# Patient Record
Sex: Male | Born: 1935 | Race: White | Hispanic: No | Smoking: Former smoker
Health system: Southern US, Community
[De-identification: ages and names within clinical notes are randomized; demographics above are authoritative.]

## PROBLEM LIST (undated history)

## (undated) ENCOUNTER — Emergency Department (HOSPITAL_COMMUNITY): Admission: EM | Payer: Self-pay | Source: Home / Self Care

## (undated) DIAGNOSIS — I509 Heart failure, unspecified: Secondary | ICD-10-CM

## (undated) DIAGNOSIS — I059 Rheumatic mitral valve disease, unspecified: Secondary | ICD-10-CM

## (undated) DIAGNOSIS — H353120 Nonexudative age-related macular degeneration, left eye, stage unspecified: Secondary | ICD-10-CM

## (undated) DIAGNOSIS — Z8719 Personal history of other diseases of the digestive system: Secondary | ICD-10-CM

## (undated) DIAGNOSIS — H353210 Exudative age-related macular degeneration, right eye, stage unspecified: Secondary | ICD-10-CM

## (undated) DIAGNOSIS — I444 Left anterior fascicular block: Secondary | ICD-10-CM

## (undated) DIAGNOSIS — N200 Calculus of kidney: Secondary | ICD-10-CM

## (undated) DIAGNOSIS — J189 Pneumonia, unspecified organism: Secondary | ICD-10-CM

## (undated) DIAGNOSIS — I471 Supraventricular tachycardia: Secondary | ICD-10-CM

## (undated) DIAGNOSIS — M1990 Unspecified osteoarthritis, unspecified site: Secondary | ICD-10-CM

## (undated) DIAGNOSIS — R011 Cardiac murmur, unspecified: Secondary | ICD-10-CM

## (undated) DIAGNOSIS — K219 Gastro-esophageal reflux disease without esophagitis: Secondary | ICD-10-CM

## (undated) HISTORY — PX: SKIN BIOPSY: SHX1

## (undated) HISTORY — DX: Rheumatic mitral valve disease, unspecified: I05.9

## (undated) HISTORY — DX: Supraventricular tachycardia: I47.1

## (undated) HISTORY — DX: Left anterior fascicular block: I44.4

## (undated) HISTORY — PX: CARDIAC CATHETERIZATION: SHX172

## (undated) HISTORY — DX: Calculus of kidney: N20.0

---

## 2000-10-19 ENCOUNTER — Ambulatory Visit (HOSPITAL_COMMUNITY): Admission: RE | Admit: 2000-10-19 | Discharge: 2000-10-19 | Payer: Self-pay | Admitting: Cardiology

## 2007-02-07 DIAGNOSIS — E785 Hyperlipidemia, unspecified: Secondary | ICD-10-CM | POA: Insufficient documentation

## 2010-04-29 DIAGNOSIS — D179 Benign lipomatous neoplasm, unspecified: Secondary | ICD-10-CM | POA: Insufficient documentation

## 2010-07-19 DIAGNOSIS — M47812 Spondylosis without myelopathy or radiculopathy, cervical region: Secondary | ICD-10-CM | POA: Insufficient documentation

## 2010-07-19 DIAGNOSIS — M5412 Radiculopathy, cervical region: Secondary | ICD-10-CM | POA: Insufficient documentation

## 2010-08-13 DIAGNOSIS — N200 Calculus of kidney: Secondary | ICD-10-CM | POA: Insufficient documentation

## 2011-04-12 DIAGNOSIS — N200 Calculus of kidney: Secondary | ICD-10-CM

## 2011-04-12 HISTORY — PX: CATARACT EXTRACTION W/ INTRAOCULAR LENS  IMPLANT, BILATERAL: SHX1307

## 2011-04-12 HISTORY — DX: Calculus of kidney: N20.0

## 2011-05-10 DIAGNOSIS — M25511 Pain in right shoulder: Secondary | ICD-10-CM | POA: Insufficient documentation

## 2011-05-10 DIAGNOSIS — H269 Unspecified cataract: Secondary | ICD-10-CM | POA: Insufficient documentation

## 2011-05-10 DIAGNOSIS — H3530 Unspecified macular degeneration: Secondary | ICD-10-CM | POA: Insufficient documentation

## 2012-01-25 DIAGNOSIS — Z961 Presence of intraocular lens: Secondary | ICD-10-CM | POA: Insufficient documentation

## 2014-09-10 DIAGNOSIS — R278 Other lack of coordination: Secondary | ICD-10-CM | POA: Diagnosis not present

## 2014-09-10 DIAGNOSIS — M6281 Muscle weakness (generalized): Secondary | ICD-10-CM | POA: Diagnosis not present

## 2014-09-10 DIAGNOSIS — R269 Unspecified abnormalities of gait and mobility: Secondary | ICD-10-CM | POA: Diagnosis not present

## 2014-09-10 DIAGNOSIS — M170 Bilateral primary osteoarthritis of knee: Secondary | ICD-10-CM | POA: Diagnosis not present

## 2014-09-11 DIAGNOSIS — R278 Other lack of coordination: Secondary | ICD-10-CM | POA: Diagnosis not present

## 2014-09-11 DIAGNOSIS — M170 Bilateral primary osteoarthritis of knee: Secondary | ICD-10-CM | POA: Diagnosis not present

## 2014-09-11 DIAGNOSIS — M6281 Muscle weakness (generalized): Secondary | ICD-10-CM | POA: Diagnosis not present

## 2014-09-11 DIAGNOSIS — R269 Unspecified abnormalities of gait and mobility: Secondary | ICD-10-CM | POA: Diagnosis not present

## 2014-09-15 DIAGNOSIS — M170 Bilateral primary osteoarthritis of knee: Secondary | ICD-10-CM | POA: Diagnosis not present

## 2014-09-15 DIAGNOSIS — R278 Other lack of coordination: Secondary | ICD-10-CM | POA: Diagnosis not present

## 2014-09-15 DIAGNOSIS — M6281 Muscle weakness (generalized): Secondary | ICD-10-CM | POA: Diagnosis not present

## 2014-09-15 DIAGNOSIS — R269 Unspecified abnormalities of gait and mobility: Secondary | ICD-10-CM | POA: Diagnosis not present

## 2014-09-16 DIAGNOSIS — M170 Bilateral primary osteoarthritis of knee: Secondary | ICD-10-CM | POA: Diagnosis not present

## 2014-09-16 DIAGNOSIS — R278 Other lack of coordination: Secondary | ICD-10-CM | POA: Diagnosis not present

## 2014-09-16 DIAGNOSIS — R269 Unspecified abnormalities of gait and mobility: Secondary | ICD-10-CM | POA: Diagnosis not present

## 2014-09-16 DIAGNOSIS — M6281 Muscle weakness (generalized): Secondary | ICD-10-CM | POA: Diagnosis not present

## 2014-09-17 DIAGNOSIS — R278 Other lack of coordination: Secondary | ICD-10-CM | POA: Diagnosis not present

## 2014-09-17 DIAGNOSIS — M6281 Muscle weakness (generalized): Secondary | ICD-10-CM | POA: Diagnosis not present

## 2014-09-17 DIAGNOSIS — R269 Unspecified abnormalities of gait and mobility: Secondary | ICD-10-CM | POA: Diagnosis not present

## 2014-09-17 DIAGNOSIS — M170 Bilateral primary osteoarthritis of knee: Secondary | ICD-10-CM | POA: Diagnosis not present

## 2014-09-18 DIAGNOSIS — R269 Unspecified abnormalities of gait and mobility: Secondary | ICD-10-CM | POA: Diagnosis not present

## 2014-09-18 DIAGNOSIS — M6281 Muscle weakness (generalized): Secondary | ICD-10-CM | POA: Diagnosis not present

## 2014-09-18 DIAGNOSIS — R278 Other lack of coordination: Secondary | ICD-10-CM | POA: Diagnosis not present

## 2014-09-18 DIAGNOSIS — M170 Bilateral primary osteoarthritis of knee: Secondary | ICD-10-CM | POA: Diagnosis not present

## 2014-09-22 DIAGNOSIS — M6281 Muscle weakness (generalized): Secondary | ICD-10-CM | POA: Diagnosis not present

## 2014-09-22 DIAGNOSIS — R269 Unspecified abnormalities of gait and mobility: Secondary | ICD-10-CM | POA: Diagnosis not present

## 2014-09-22 DIAGNOSIS — M170 Bilateral primary osteoarthritis of knee: Secondary | ICD-10-CM | POA: Diagnosis not present

## 2014-09-22 DIAGNOSIS — R278 Other lack of coordination: Secondary | ICD-10-CM | POA: Diagnosis not present

## 2014-09-23 DIAGNOSIS — M170 Bilateral primary osteoarthritis of knee: Secondary | ICD-10-CM | POA: Diagnosis not present

## 2014-09-23 DIAGNOSIS — Z6828 Body mass index (BMI) 28.0-28.9, adult: Secondary | ICD-10-CM | POA: Diagnosis not present

## 2014-09-23 DIAGNOSIS — M6281 Muscle weakness (generalized): Secondary | ICD-10-CM | POA: Diagnosis not present

## 2014-09-23 DIAGNOSIS — N400 Enlarged prostate without lower urinary tract symptoms: Secondary | ICD-10-CM | POA: Diagnosis not present

## 2014-09-23 DIAGNOSIS — R269 Unspecified abnormalities of gait and mobility: Secondary | ICD-10-CM | POA: Diagnosis not present

## 2014-09-23 DIAGNOSIS — H3530 Unspecified macular degeneration: Secondary | ICD-10-CM | POA: Diagnosis not present

## 2014-09-23 DIAGNOSIS — Z0000 Encounter for general adult medical examination without abnormal findings: Secondary | ICD-10-CM | POA: Diagnosis not present

## 2014-09-23 DIAGNOSIS — M1990 Unspecified osteoarthritis, unspecified site: Secondary | ICD-10-CM | POA: Diagnosis not present

## 2014-09-23 DIAGNOSIS — R278 Other lack of coordination: Secondary | ICD-10-CM | POA: Diagnosis not present

## 2014-09-23 DIAGNOSIS — K219 Gastro-esophageal reflux disease without esophagitis: Secondary | ICD-10-CM | POA: Diagnosis not present

## 2014-09-23 DIAGNOSIS — I38 Endocarditis, valve unspecified: Secondary | ICD-10-CM | POA: Diagnosis not present

## 2014-09-23 DIAGNOSIS — Z23 Encounter for immunization: Secondary | ICD-10-CM | POA: Diagnosis not present

## 2014-09-24 DIAGNOSIS — Z1212 Encounter for screening for malignant neoplasm of rectum: Secondary | ICD-10-CM | POA: Diagnosis not present

## 2014-09-24 DIAGNOSIS — M6281 Muscle weakness (generalized): Secondary | ICD-10-CM | POA: Diagnosis not present

## 2014-09-24 DIAGNOSIS — R269 Unspecified abnormalities of gait and mobility: Secondary | ICD-10-CM | POA: Diagnosis not present

## 2014-09-24 DIAGNOSIS — M170 Bilateral primary osteoarthritis of knee: Secondary | ICD-10-CM | POA: Diagnosis not present

## 2014-09-24 DIAGNOSIS — R278 Other lack of coordination: Secondary | ICD-10-CM | POA: Diagnosis not present

## 2014-09-29 DIAGNOSIS — M170 Bilateral primary osteoarthritis of knee: Secondary | ICD-10-CM | POA: Diagnosis not present

## 2014-09-29 DIAGNOSIS — R269 Unspecified abnormalities of gait and mobility: Secondary | ICD-10-CM | POA: Diagnosis not present

## 2014-09-29 DIAGNOSIS — M6281 Muscle weakness (generalized): Secondary | ICD-10-CM | POA: Diagnosis not present

## 2014-09-29 DIAGNOSIS — R278 Other lack of coordination: Secondary | ICD-10-CM | POA: Diagnosis not present

## 2014-10-01 DIAGNOSIS — R278 Other lack of coordination: Secondary | ICD-10-CM | POA: Diagnosis not present

## 2014-10-01 DIAGNOSIS — M6281 Muscle weakness (generalized): Secondary | ICD-10-CM | POA: Diagnosis not present

## 2014-10-01 DIAGNOSIS — R269 Unspecified abnormalities of gait and mobility: Secondary | ICD-10-CM | POA: Diagnosis not present

## 2014-10-01 DIAGNOSIS — M170 Bilateral primary osteoarthritis of knee: Secondary | ICD-10-CM | POA: Diagnosis not present

## 2014-10-06 DIAGNOSIS — M6281 Muscle weakness (generalized): Secondary | ICD-10-CM | POA: Diagnosis not present

## 2014-10-06 DIAGNOSIS — R269 Unspecified abnormalities of gait and mobility: Secondary | ICD-10-CM | POA: Diagnosis not present

## 2014-10-06 DIAGNOSIS — R278 Other lack of coordination: Secondary | ICD-10-CM | POA: Diagnosis not present

## 2014-10-06 DIAGNOSIS — M170 Bilateral primary osteoarthritis of knee: Secondary | ICD-10-CM | POA: Diagnosis not present

## 2014-10-08 DIAGNOSIS — M170 Bilateral primary osteoarthritis of knee: Secondary | ICD-10-CM | POA: Diagnosis not present

## 2014-10-08 DIAGNOSIS — M6281 Muscle weakness (generalized): Secondary | ICD-10-CM | POA: Diagnosis not present

## 2014-10-08 DIAGNOSIS — R269 Unspecified abnormalities of gait and mobility: Secondary | ICD-10-CM | POA: Diagnosis not present

## 2014-10-08 DIAGNOSIS — R278 Other lack of coordination: Secondary | ICD-10-CM | POA: Diagnosis not present

## 2014-10-15 DIAGNOSIS — M79622 Pain in left upper arm: Secondary | ICD-10-CM | POA: Diagnosis not present

## 2014-10-15 DIAGNOSIS — M6281 Muscle weakness (generalized): Secondary | ICD-10-CM | POA: Diagnosis not present

## 2014-10-15 DIAGNOSIS — R278 Other lack of coordination: Secondary | ICD-10-CM | POA: Diagnosis not present

## 2014-12-26 DIAGNOSIS — M25561 Pain in right knee: Secondary | ICD-10-CM | POA: Diagnosis not present

## 2014-12-26 DIAGNOSIS — M1711 Unilateral primary osteoarthritis, right knee: Secondary | ICD-10-CM | POA: Diagnosis not present

## 2014-12-29 DIAGNOSIS — Z23 Encounter for immunization: Secondary | ICD-10-CM | POA: Diagnosis not present

## 2015-01-01 DIAGNOSIS — H6123 Impacted cerumen, bilateral: Secondary | ICD-10-CM | POA: Diagnosis not present

## 2015-01-01 DIAGNOSIS — H9190 Unspecified hearing loss, unspecified ear: Secondary | ICD-10-CM | POA: Diagnosis not present

## 2015-01-01 DIAGNOSIS — Z6828 Body mass index (BMI) 28.0-28.9, adult: Secondary | ICD-10-CM | POA: Diagnosis not present

## 2015-01-02 DIAGNOSIS — M1711 Unilateral primary osteoarthritis, right knee: Secondary | ICD-10-CM | POA: Diagnosis not present

## 2015-01-09 DIAGNOSIS — M1711 Unilateral primary osteoarthritis, right knee: Secondary | ICD-10-CM | POA: Diagnosis not present

## 2015-02-02 DIAGNOSIS — M25561 Pain in right knee: Secondary | ICD-10-CM | POA: Diagnosis not present

## 2015-02-02 DIAGNOSIS — R278 Other lack of coordination: Secondary | ICD-10-CM | POA: Diagnosis not present

## 2015-02-02 DIAGNOSIS — M6281 Muscle weakness (generalized): Secondary | ICD-10-CM | POA: Diagnosis not present

## 2015-02-02 DIAGNOSIS — R262 Difficulty in walking, not elsewhere classified: Secondary | ICD-10-CM | POA: Diagnosis not present

## 2015-02-02 DIAGNOSIS — M25562 Pain in left knee: Secondary | ICD-10-CM | POA: Diagnosis not present

## 2015-02-04 DIAGNOSIS — M25562 Pain in left knee: Secondary | ICD-10-CM | POA: Diagnosis not present

## 2015-02-04 DIAGNOSIS — M6281 Muscle weakness (generalized): Secondary | ICD-10-CM | POA: Diagnosis not present

## 2015-02-04 DIAGNOSIS — R262 Difficulty in walking, not elsewhere classified: Secondary | ICD-10-CM | POA: Diagnosis not present

## 2015-02-04 DIAGNOSIS — R278 Other lack of coordination: Secondary | ICD-10-CM | POA: Diagnosis not present

## 2015-02-04 DIAGNOSIS — M25561 Pain in right knee: Secondary | ICD-10-CM | POA: Diagnosis not present

## 2015-02-05 DIAGNOSIS — R262 Difficulty in walking, not elsewhere classified: Secondary | ICD-10-CM | POA: Diagnosis not present

## 2015-02-05 DIAGNOSIS — M25561 Pain in right knee: Secondary | ICD-10-CM | POA: Diagnosis not present

## 2015-02-05 DIAGNOSIS — M6281 Muscle weakness (generalized): Secondary | ICD-10-CM | POA: Diagnosis not present

## 2015-02-05 DIAGNOSIS — M25562 Pain in left knee: Secondary | ICD-10-CM | POA: Diagnosis not present

## 2015-02-05 DIAGNOSIS — R278 Other lack of coordination: Secondary | ICD-10-CM | POA: Diagnosis not present

## 2015-02-10 DIAGNOSIS — M25562 Pain in left knee: Secondary | ICD-10-CM | POA: Diagnosis not present

## 2015-02-10 DIAGNOSIS — R262 Difficulty in walking, not elsewhere classified: Secondary | ICD-10-CM | POA: Diagnosis not present

## 2015-02-10 DIAGNOSIS — M6281 Muscle weakness (generalized): Secondary | ICD-10-CM | POA: Diagnosis not present

## 2015-02-10 DIAGNOSIS — M25561 Pain in right knee: Secondary | ICD-10-CM | POA: Diagnosis not present

## 2015-02-10 DIAGNOSIS — R278 Other lack of coordination: Secondary | ICD-10-CM | POA: Diagnosis not present

## 2015-02-12 DIAGNOSIS — R278 Other lack of coordination: Secondary | ICD-10-CM | POA: Diagnosis not present

## 2015-02-12 DIAGNOSIS — R262 Difficulty in walking, not elsewhere classified: Secondary | ICD-10-CM | POA: Diagnosis not present

## 2015-02-12 DIAGNOSIS — M25561 Pain in right knee: Secondary | ICD-10-CM | POA: Diagnosis not present

## 2015-02-12 DIAGNOSIS — M25562 Pain in left knee: Secondary | ICD-10-CM | POA: Diagnosis not present

## 2015-02-12 DIAGNOSIS — M6281 Muscle weakness (generalized): Secondary | ICD-10-CM | POA: Diagnosis not present

## 2015-02-13 DIAGNOSIS — M25561 Pain in right knee: Secondary | ICD-10-CM | POA: Diagnosis not present

## 2015-02-13 DIAGNOSIS — R278 Other lack of coordination: Secondary | ICD-10-CM | POA: Diagnosis not present

## 2015-02-13 DIAGNOSIS — M25562 Pain in left knee: Secondary | ICD-10-CM | POA: Diagnosis not present

## 2015-02-13 DIAGNOSIS — R262 Difficulty in walking, not elsewhere classified: Secondary | ICD-10-CM | POA: Diagnosis not present

## 2015-02-13 DIAGNOSIS — M6281 Muscle weakness (generalized): Secondary | ICD-10-CM | POA: Diagnosis not present

## 2015-02-16 DIAGNOSIS — M25562 Pain in left knee: Secondary | ICD-10-CM | POA: Diagnosis not present

## 2015-02-16 DIAGNOSIS — M25561 Pain in right knee: Secondary | ICD-10-CM | POA: Diagnosis not present

## 2015-02-16 DIAGNOSIS — R278 Other lack of coordination: Secondary | ICD-10-CM | POA: Diagnosis not present

## 2015-02-16 DIAGNOSIS — M6281 Muscle weakness (generalized): Secondary | ICD-10-CM | POA: Diagnosis not present

## 2015-02-16 DIAGNOSIS — R262 Difficulty in walking, not elsewhere classified: Secondary | ICD-10-CM | POA: Diagnosis not present

## 2015-02-19 DIAGNOSIS — R278 Other lack of coordination: Secondary | ICD-10-CM | POA: Diagnosis not present

## 2015-02-19 DIAGNOSIS — M25562 Pain in left knee: Secondary | ICD-10-CM | POA: Diagnosis not present

## 2015-02-19 DIAGNOSIS — M25561 Pain in right knee: Secondary | ICD-10-CM | POA: Diagnosis not present

## 2015-02-19 DIAGNOSIS — R262 Difficulty in walking, not elsewhere classified: Secondary | ICD-10-CM | POA: Diagnosis not present

## 2015-02-19 DIAGNOSIS — M6281 Muscle weakness (generalized): Secondary | ICD-10-CM | POA: Diagnosis not present

## 2015-02-20 DIAGNOSIS — M25561 Pain in right knee: Secondary | ICD-10-CM | POA: Diagnosis not present

## 2015-02-20 DIAGNOSIS — M6281 Muscle weakness (generalized): Secondary | ICD-10-CM | POA: Diagnosis not present

## 2015-02-20 DIAGNOSIS — R278 Other lack of coordination: Secondary | ICD-10-CM | POA: Diagnosis not present

## 2015-02-20 DIAGNOSIS — R262 Difficulty in walking, not elsewhere classified: Secondary | ICD-10-CM | POA: Diagnosis not present

## 2015-02-20 DIAGNOSIS — M25562 Pain in left knee: Secondary | ICD-10-CM | POA: Diagnosis not present

## 2015-02-23 DIAGNOSIS — R262 Difficulty in walking, not elsewhere classified: Secondary | ICD-10-CM | POA: Diagnosis not present

## 2015-02-23 DIAGNOSIS — M6281 Muscle weakness (generalized): Secondary | ICD-10-CM | POA: Diagnosis not present

## 2015-02-23 DIAGNOSIS — R278 Other lack of coordination: Secondary | ICD-10-CM | POA: Diagnosis not present

## 2015-02-23 DIAGNOSIS — M25561 Pain in right knee: Secondary | ICD-10-CM | POA: Diagnosis not present

## 2015-02-23 DIAGNOSIS — M25562 Pain in left knee: Secondary | ICD-10-CM | POA: Diagnosis not present

## 2015-02-24 DIAGNOSIS — H906 Mixed conductive and sensorineural hearing loss, bilateral: Secondary | ICD-10-CM | POA: Diagnosis not present

## 2015-02-24 DIAGNOSIS — H903 Sensorineural hearing loss, bilateral: Secondary | ICD-10-CM | POA: Diagnosis not present

## 2015-02-25 DIAGNOSIS — M6281 Muscle weakness (generalized): Secondary | ICD-10-CM | POA: Diagnosis not present

## 2015-02-25 DIAGNOSIS — M25562 Pain in left knee: Secondary | ICD-10-CM | POA: Diagnosis not present

## 2015-02-25 DIAGNOSIS — M25561 Pain in right knee: Secondary | ICD-10-CM | POA: Diagnosis not present

## 2015-02-25 DIAGNOSIS — R278 Other lack of coordination: Secondary | ICD-10-CM | POA: Diagnosis not present

## 2015-02-25 DIAGNOSIS — R262 Difficulty in walking, not elsewhere classified: Secondary | ICD-10-CM | POA: Diagnosis not present

## 2015-03-02 DIAGNOSIS — R262 Difficulty in walking, not elsewhere classified: Secondary | ICD-10-CM | POA: Diagnosis not present

## 2015-03-02 DIAGNOSIS — R278 Other lack of coordination: Secondary | ICD-10-CM | POA: Diagnosis not present

## 2015-03-02 DIAGNOSIS — M25561 Pain in right knee: Secondary | ICD-10-CM | POA: Diagnosis not present

## 2015-03-02 DIAGNOSIS — M25562 Pain in left knee: Secondary | ICD-10-CM | POA: Diagnosis not present

## 2015-03-02 DIAGNOSIS — M6281 Muscle weakness (generalized): Secondary | ICD-10-CM | POA: Diagnosis not present

## 2015-03-04 DIAGNOSIS — M6281 Muscle weakness (generalized): Secondary | ICD-10-CM | POA: Diagnosis not present

## 2015-03-04 DIAGNOSIS — M25562 Pain in left knee: Secondary | ICD-10-CM | POA: Diagnosis not present

## 2015-03-04 DIAGNOSIS — R278 Other lack of coordination: Secondary | ICD-10-CM | POA: Diagnosis not present

## 2015-03-04 DIAGNOSIS — R262 Difficulty in walking, not elsewhere classified: Secondary | ICD-10-CM | POA: Diagnosis not present

## 2015-03-04 DIAGNOSIS — M25561 Pain in right knee: Secondary | ICD-10-CM | POA: Diagnosis not present

## 2015-03-09 DIAGNOSIS — M6281 Muscle weakness (generalized): Secondary | ICD-10-CM | POA: Diagnosis not present

## 2015-03-09 DIAGNOSIS — M25562 Pain in left knee: Secondary | ICD-10-CM | POA: Diagnosis not present

## 2015-03-09 DIAGNOSIS — M25561 Pain in right knee: Secondary | ICD-10-CM | POA: Diagnosis not present

## 2015-03-09 DIAGNOSIS — R278 Other lack of coordination: Secondary | ICD-10-CM | POA: Diagnosis not present

## 2015-03-09 DIAGNOSIS — R262 Difficulty in walking, not elsewhere classified: Secondary | ICD-10-CM | POA: Diagnosis not present

## 2015-03-12 DIAGNOSIS — R262 Difficulty in walking, not elsewhere classified: Secondary | ICD-10-CM | POA: Diagnosis not present

## 2015-03-12 DIAGNOSIS — M6281 Muscle weakness (generalized): Secondary | ICD-10-CM | POA: Diagnosis not present

## 2015-03-12 DIAGNOSIS — M25562 Pain in left knee: Secondary | ICD-10-CM | POA: Diagnosis not present

## 2015-03-12 DIAGNOSIS — M25561 Pain in right knee: Secondary | ICD-10-CM | POA: Diagnosis not present

## 2015-03-12 DIAGNOSIS — R278 Other lack of coordination: Secondary | ICD-10-CM | POA: Diagnosis not present

## 2015-03-16 DIAGNOSIS — M25562 Pain in left knee: Secondary | ICD-10-CM | POA: Diagnosis not present

## 2015-03-16 DIAGNOSIS — M6281 Muscle weakness (generalized): Secondary | ICD-10-CM | POA: Diagnosis not present

## 2015-03-16 DIAGNOSIS — M25561 Pain in right knee: Secondary | ICD-10-CM | POA: Diagnosis not present

## 2015-03-16 DIAGNOSIS — R278 Other lack of coordination: Secondary | ICD-10-CM | POA: Diagnosis not present

## 2015-03-16 DIAGNOSIS — R262 Difficulty in walking, not elsewhere classified: Secondary | ICD-10-CM | POA: Diagnosis not present

## 2015-04-07 DIAGNOSIS — H0100B Unspecified blepharitis left eye, upper and lower eyelids: Secondary | ICD-10-CM

## 2015-04-07 DIAGNOSIS — Z961 Presence of intraocular lens: Secondary | ICD-10-CM | POA: Diagnosis not present

## 2015-04-07 DIAGNOSIS — H0100A Unspecified blepharitis right eye, upper and lower eyelids: Secondary | ICD-10-CM | POA: Insufficient documentation

## 2015-04-07 DIAGNOSIS — H43812 Vitreous degeneration, left eye: Secondary | ICD-10-CM | POA: Diagnosis not present

## 2015-04-07 DIAGNOSIS — H353190 Nonexudative age-related macular degeneration, unspecified eye, stage unspecified: Secondary | ICD-10-CM | POA: Diagnosis not present

## 2015-04-07 DIAGNOSIS — H01004 Unspecified blepharitis left upper eyelid: Secondary | ICD-10-CM | POA: Diagnosis not present

## 2015-04-07 DIAGNOSIS — H01005 Unspecified blepharitis left lower eyelid: Secondary | ICD-10-CM | POA: Diagnosis not present

## 2015-04-07 DIAGNOSIS — H01001 Unspecified blepharitis right upper eyelid: Secondary | ICD-10-CM | POA: Diagnosis not present

## 2015-04-07 DIAGNOSIS — H01002 Unspecified blepharitis right lower eyelid: Secondary | ICD-10-CM | POA: Diagnosis not present

## 2015-05-27 DIAGNOSIS — H52203 Unspecified astigmatism, bilateral: Secondary | ICD-10-CM | POA: Diagnosis not present

## 2015-05-27 DIAGNOSIS — H5203 Hypermetropia, bilateral: Secondary | ICD-10-CM | POA: Diagnosis not present

## 2015-06-03 DIAGNOSIS — N200 Calculus of kidney: Secondary | ICD-10-CM | POA: Diagnosis not present

## 2015-06-03 DIAGNOSIS — N400 Enlarged prostate without lower urinary tract symptoms: Secondary | ICD-10-CM | POA: Diagnosis not present

## 2015-06-03 DIAGNOSIS — R3129 Other microscopic hematuria: Secondary | ICD-10-CM | POA: Diagnosis not present

## 2015-06-15 DIAGNOSIS — H353190 Nonexudative age-related macular degeneration, unspecified eye, stage unspecified: Secondary | ICD-10-CM | POA: Diagnosis not present

## 2015-06-15 DIAGNOSIS — N400 Enlarged prostate without lower urinary tract symptoms: Secondary | ICD-10-CM | POA: Diagnosis not present

## 2015-06-15 DIAGNOSIS — M1990 Unspecified osteoarthritis, unspecified site: Secondary | ICD-10-CM | POA: Diagnosis not present

## 2015-06-15 DIAGNOSIS — H918X3 Other specified hearing loss, bilateral: Secondary | ICD-10-CM | POA: Diagnosis not present

## 2015-06-15 DIAGNOSIS — Z6828 Body mass index (BMI) 28.0-28.9, adult: Secondary | ICD-10-CM | POA: Diagnosis not present

## 2015-09-18 DIAGNOSIS — I4460 Unspecified fascicular block: Secondary | ICD-10-CM | POA: Diagnosis not present

## 2015-09-18 DIAGNOSIS — I059 Rheumatic mitral valve disease, unspecified: Secondary | ICD-10-CM | POA: Diagnosis not present

## 2015-09-22 DIAGNOSIS — Z0000 Encounter for general adult medical examination without abnormal findings: Secondary | ICD-10-CM | POA: Diagnosis not present

## 2015-09-22 DIAGNOSIS — L0889 Other specified local infections of the skin and subcutaneous tissue: Secondary | ICD-10-CM | POA: Diagnosis not present

## 2015-09-22 DIAGNOSIS — M79642 Pain in left hand: Secondary | ICD-10-CM | POA: Diagnosis not present

## 2015-09-29 DIAGNOSIS — Z0000 Encounter for general adult medical examination without abnormal findings: Secondary | ICD-10-CM | POA: Diagnosis not present

## 2015-09-29 DIAGNOSIS — Z1389 Encounter for screening for other disorder: Secondary | ICD-10-CM | POA: Diagnosis not present

## 2015-09-29 DIAGNOSIS — H3530 Unspecified macular degeneration: Secondary | ICD-10-CM | POA: Diagnosis not present

## 2015-09-29 DIAGNOSIS — M25519 Pain in unspecified shoulder: Secondary | ICD-10-CM | POA: Diagnosis not present

## 2015-09-29 DIAGNOSIS — L0889 Other specified local infections of the skin and subcutaneous tissue: Secondary | ICD-10-CM | POA: Diagnosis not present

## 2015-09-29 DIAGNOSIS — K219 Gastro-esophageal reflux disease without esophagitis: Secondary | ICD-10-CM | POA: Diagnosis not present

## 2015-09-29 DIAGNOSIS — Z6828 Body mass index (BMI) 28.0-28.9, adult: Secondary | ICD-10-CM | POA: Diagnosis not present

## 2015-09-29 DIAGNOSIS — M25569 Pain in unspecified knee: Secondary | ICD-10-CM | POA: Diagnosis not present

## 2015-09-29 DIAGNOSIS — N400 Enlarged prostate without lower urinary tract symptoms: Secondary | ICD-10-CM | POA: Diagnosis not present

## 2015-10-06 DIAGNOSIS — H01004 Unspecified blepharitis left upper eyelid: Secondary | ICD-10-CM | POA: Diagnosis not present

## 2015-10-06 DIAGNOSIS — H01001 Unspecified blepharitis right upper eyelid: Secondary | ICD-10-CM | POA: Diagnosis not present

## 2015-10-06 DIAGNOSIS — H353130 Nonexudative age-related macular degeneration, bilateral, stage unspecified: Secondary | ICD-10-CM | POA: Diagnosis not present

## 2015-10-06 DIAGNOSIS — H01002 Unspecified blepharitis right lower eyelid: Secondary | ICD-10-CM | POA: Diagnosis not present

## 2015-10-06 DIAGNOSIS — Z961 Presence of intraocular lens: Secondary | ICD-10-CM | POA: Diagnosis not present

## 2015-10-06 DIAGNOSIS — H43812 Vitreous degeneration, left eye: Secondary | ICD-10-CM | POA: Diagnosis not present

## 2015-10-06 DIAGNOSIS — H01005 Unspecified blepharitis left lower eyelid: Secondary | ICD-10-CM | POA: Diagnosis not present

## 2015-11-03 DIAGNOSIS — R279 Unspecified lack of coordination: Secondary | ICD-10-CM | POA: Diagnosis not present

## 2015-11-03 DIAGNOSIS — R52 Pain, unspecified: Secondary | ICD-10-CM | POA: Diagnosis not present

## 2015-11-03 DIAGNOSIS — S4992XD Unspecified injury of left shoulder and upper arm, subsequent encounter: Secondary | ICD-10-CM | POA: Diagnosis not present

## 2015-11-03 DIAGNOSIS — M6281 Muscle weakness (generalized): Secondary | ICD-10-CM | POA: Diagnosis not present

## 2015-11-03 DIAGNOSIS — R262 Difficulty in walking, not elsewhere classified: Secondary | ICD-10-CM | POA: Diagnosis not present

## 2015-11-04 DIAGNOSIS — R262 Difficulty in walking, not elsewhere classified: Secondary | ICD-10-CM | POA: Diagnosis not present

## 2015-11-04 DIAGNOSIS — S4992XD Unspecified injury of left shoulder and upper arm, subsequent encounter: Secondary | ICD-10-CM | POA: Diagnosis not present

## 2015-11-04 DIAGNOSIS — R279 Unspecified lack of coordination: Secondary | ICD-10-CM | POA: Diagnosis not present

## 2015-11-04 DIAGNOSIS — R52 Pain, unspecified: Secondary | ICD-10-CM | POA: Diagnosis not present

## 2015-11-04 DIAGNOSIS — M6281 Muscle weakness (generalized): Secondary | ICD-10-CM | POA: Diagnosis not present

## 2015-11-10 DIAGNOSIS — S4992XD Unspecified injury of left shoulder and upper arm, subsequent encounter: Secondary | ICD-10-CM | POA: Diagnosis not present

## 2015-11-10 DIAGNOSIS — R262 Difficulty in walking, not elsewhere classified: Secondary | ICD-10-CM | POA: Diagnosis not present

## 2015-11-10 DIAGNOSIS — R279 Unspecified lack of coordination: Secondary | ICD-10-CM | POA: Diagnosis not present

## 2015-11-10 DIAGNOSIS — R52 Pain, unspecified: Secondary | ICD-10-CM | POA: Diagnosis not present

## 2015-11-10 DIAGNOSIS — M6281 Muscle weakness (generalized): Secondary | ICD-10-CM | POA: Diagnosis not present

## 2015-11-11 DIAGNOSIS — R52 Pain, unspecified: Secondary | ICD-10-CM | POA: Diagnosis not present

## 2015-11-11 DIAGNOSIS — S4992XD Unspecified injury of left shoulder and upper arm, subsequent encounter: Secondary | ICD-10-CM | POA: Diagnosis not present

## 2015-11-11 DIAGNOSIS — M6281 Muscle weakness (generalized): Secondary | ICD-10-CM | POA: Diagnosis not present

## 2015-11-11 DIAGNOSIS — R279 Unspecified lack of coordination: Secondary | ICD-10-CM | POA: Diagnosis not present

## 2015-11-11 DIAGNOSIS — R262 Difficulty in walking, not elsewhere classified: Secondary | ICD-10-CM | POA: Diagnosis not present

## 2015-11-12 DIAGNOSIS — R262 Difficulty in walking, not elsewhere classified: Secondary | ICD-10-CM | POA: Diagnosis not present

## 2015-11-12 DIAGNOSIS — S4992XD Unspecified injury of left shoulder and upper arm, subsequent encounter: Secondary | ICD-10-CM | POA: Diagnosis not present

## 2015-11-12 DIAGNOSIS — R279 Unspecified lack of coordination: Secondary | ICD-10-CM | POA: Diagnosis not present

## 2015-11-12 DIAGNOSIS — R52 Pain, unspecified: Secondary | ICD-10-CM | POA: Diagnosis not present

## 2015-11-12 DIAGNOSIS — M6281 Muscle weakness (generalized): Secondary | ICD-10-CM | POA: Diagnosis not present

## 2015-11-17 DIAGNOSIS — R279 Unspecified lack of coordination: Secondary | ICD-10-CM | POA: Diagnosis not present

## 2015-11-17 DIAGNOSIS — S4992XD Unspecified injury of left shoulder and upper arm, subsequent encounter: Secondary | ICD-10-CM | POA: Diagnosis not present

## 2015-11-17 DIAGNOSIS — R52 Pain, unspecified: Secondary | ICD-10-CM | POA: Diagnosis not present

## 2015-11-17 DIAGNOSIS — R262 Difficulty in walking, not elsewhere classified: Secondary | ICD-10-CM | POA: Diagnosis not present

## 2015-11-17 DIAGNOSIS — M6281 Muscle weakness (generalized): Secondary | ICD-10-CM | POA: Diagnosis not present

## 2015-11-19 DIAGNOSIS — R52 Pain, unspecified: Secondary | ICD-10-CM | POA: Diagnosis not present

## 2015-11-19 DIAGNOSIS — M6281 Muscle weakness (generalized): Secondary | ICD-10-CM | POA: Diagnosis not present

## 2015-11-19 DIAGNOSIS — R279 Unspecified lack of coordination: Secondary | ICD-10-CM | POA: Diagnosis not present

## 2015-11-19 DIAGNOSIS — S4992XD Unspecified injury of left shoulder and upper arm, subsequent encounter: Secondary | ICD-10-CM | POA: Diagnosis not present

## 2015-11-19 DIAGNOSIS — R262 Difficulty in walking, not elsewhere classified: Secondary | ICD-10-CM | POA: Diagnosis not present

## 2015-11-24 DIAGNOSIS — R52 Pain, unspecified: Secondary | ICD-10-CM | POA: Diagnosis not present

## 2015-11-24 DIAGNOSIS — R279 Unspecified lack of coordination: Secondary | ICD-10-CM | POA: Diagnosis not present

## 2015-11-24 DIAGNOSIS — S4992XD Unspecified injury of left shoulder and upper arm, subsequent encounter: Secondary | ICD-10-CM | POA: Diagnosis not present

## 2015-11-24 DIAGNOSIS — R262 Difficulty in walking, not elsewhere classified: Secondary | ICD-10-CM | POA: Diagnosis not present

## 2015-11-24 DIAGNOSIS — M6281 Muscle weakness (generalized): Secondary | ICD-10-CM | POA: Diagnosis not present

## 2015-11-25 DIAGNOSIS — R52 Pain, unspecified: Secondary | ICD-10-CM | POA: Diagnosis not present

## 2015-11-25 DIAGNOSIS — M6281 Muscle weakness (generalized): Secondary | ICD-10-CM | POA: Diagnosis not present

## 2015-11-25 DIAGNOSIS — R262 Difficulty in walking, not elsewhere classified: Secondary | ICD-10-CM | POA: Diagnosis not present

## 2015-11-25 DIAGNOSIS — R279 Unspecified lack of coordination: Secondary | ICD-10-CM | POA: Diagnosis not present

## 2015-11-25 DIAGNOSIS — S4992XD Unspecified injury of left shoulder and upper arm, subsequent encounter: Secondary | ICD-10-CM | POA: Diagnosis not present

## 2015-11-26 DIAGNOSIS — S4992XD Unspecified injury of left shoulder and upper arm, subsequent encounter: Secondary | ICD-10-CM | POA: Diagnosis not present

## 2015-11-26 DIAGNOSIS — R279 Unspecified lack of coordination: Secondary | ICD-10-CM | POA: Diagnosis not present

## 2015-11-26 DIAGNOSIS — R52 Pain, unspecified: Secondary | ICD-10-CM | POA: Diagnosis not present

## 2015-11-26 DIAGNOSIS — M6281 Muscle weakness (generalized): Secondary | ICD-10-CM | POA: Diagnosis not present

## 2015-11-26 DIAGNOSIS — R262 Difficulty in walking, not elsewhere classified: Secondary | ICD-10-CM | POA: Diagnosis not present

## 2015-12-01 DIAGNOSIS — M6281 Muscle weakness (generalized): Secondary | ICD-10-CM | POA: Diagnosis not present

## 2015-12-01 DIAGNOSIS — S4992XD Unspecified injury of left shoulder and upper arm, subsequent encounter: Secondary | ICD-10-CM | POA: Diagnosis not present

## 2015-12-01 DIAGNOSIS — R279 Unspecified lack of coordination: Secondary | ICD-10-CM | POA: Diagnosis not present

## 2015-12-01 DIAGNOSIS — R52 Pain, unspecified: Secondary | ICD-10-CM | POA: Diagnosis not present

## 2015-12-01 DIAGNOSIS — R262 Difficulty in walking, not elsewhere classified: Secondary | ICD-10-CM | POA: Diagnosis not present

## 2015-12-02 DIAGNOSIS — R262 Difficulty in walking, not elsewhere classified: Secondary | ICD-10-CM | POA: Diagnosis not present

## 2015-12-02 DIAGNOSIS — M6281 Muscle weakness (generalized): Secondary | ICD-10-CM | POA: Diagnosis not present

## 2015-12-02 DIAGNOSIS — R279 Unspecified lack of coordination: Secondary | ICD-10-CM | POA: Diagnosis not present

## 2015-12-02 DIAGNOSIS — S4992XD Unspecified injury of left shoulder and upper arm, subsequent encounter: Secondary | ICD-10-CM | POA: Diagnosis not present

## 2015-12-02 DIAGNOSIS — R52 Pain, unspecified: Secondary | ICD-10-CM | POA: Diagnosis not present

## 2015-12-10 DIAGNOSIS — R52 Pain, unspecified: Secondary | ICD-10-CM | POA: Diagnosis not present

## 2015-12-10 DIAGNOSIS — R279 Unspecified lack of coordination: Secondary | ICD-10-CM | POA: Diagnosis not present

## 2015-12-10 DIAGNOSIS — M6281 Muscle weakness (generalized): Secondary | ICD-10-CM | POA: Diagnosis not present

## 2015-12-10 DIAGNOSIS — R262 Difficulty in walking, not elsewhere classified: Secondary | ICD-10-CM | POA: Diagnosis not present

## 2015-12-10 DIAGNOSIS — S4992XD Unspecified injury of left shoulder and upper arm, subsequent encounter: Secondary | ICD-10-CM | POA: Diagnosis not present

## 2015-12-16 DIAGNOSIS — M6281 Muscle weakness (generalized): Secondary | ICD-10-CM | POA: Diagnosis not present

## 2015-12-16 DIAGNOSIS — R262 Difficulty in walking, not elsewhere classified: Secondary | ICD-10-CM | POA: Diagnosis not present

## 2015-12-16 DIAGNOSIS — S4992XD Unspecified injury of left shoulder and upper arm, subsequent encounter: Secondary | ICD-10-CM | POA: Diagnosis not present

## 2015-12-16 DIAGNOSIS — R279 Unspecified lack of coordination: Secondary | ICD-10-CM | POA: Diagnosis not present

## 2015-12-16 DIAGNOSIS — R52 Pain, unspecified: Secondary | ICD-10-CM | POA: Diagnosis not present

## 2015-12-17 DIAGNOSIS — S4992XD Unspecified injury of left shoulder and upper arm, subsequent encounter: Secondary | ICD-10-CM | POA: Diagnosis not present

## 2015-12-17 DIAGNOSIS — R262 Difficulty in walking, not elsewhere classified: Secondary | ICD-10-CM | POA: Diagnosis not present

## 2015-12-17 DIAGNOSIS — M6281 Muscle weakness (generalized): Secondary | ICD-10-CM | POA: Diagnosis not present

## 2015-12-17 DIAGNOSIS — R279 Unspecified lack of coordination: Secondary | ICD-10-CM | POA: Diagnosis not present

## 2015-12-17 DIAGNOSIS — R52 Pain, unspecified: Secondary | ICD-10-CM | POA: Diagnosis not present

## 2015-12-24 DIAGNOSIS — R262 Difficulty in walking, not elsewhere classified: Secondary | ICD-10-CM | POA: Diagnosis not present

## 2015-12-24 DIAGNOSIS — S4992XD Unspecified injury of left shoulder and upper arm, subsequent encounter: Secondary | ICD-10-CM | POA: Diagnosis not present

## 2015-12-24 DIAGNOSIS — M6281 Muscle weakness (generalized): Secondary | ICD-10-CM | POA: Diagnosis not present

## 2015-12-24 DIAGNOSIS — R52 Pain, unspecified: Secondary | ICD-10-CM | POA: Diagnosis not present

## 2015-12-24 DIAGNOSIS — R279 Unspecified lack of coordination: Secondary | ICD-10-CM | POA: Diagnosis not present

## 2015-12-29 DIAGNOSIS — R8299 Other abnormal findings in urine: Secondary | ICD-10-CM | POA: Diagnosis not present

## 2015-12-31 DIAGNOSIS — R262 Difficulty in walking, not elsewhere classified: Secondary | ICD-10-CM | POA: Diagnosis not present

## 2015-12-31 DIAGNOSIS — R279 Unspecified lack of coordination: Secondary | ICD-10-CM | POA: Diagnosis not present

## 2015-12-31 DIAGNOSIS — R52 Pain, unspecified: Secondary | ICD-10-CM | POA: Diagnosis not present

## 2015-12-31 DIAGNOSIS — M6281 Muscle weakness (generalized): Secondary | ICD-10-CM | POA: Diagnosis not present

## 2015-12-31 DIAGNOSIS — S4992XD Unspecified injury of left shoulder and upper arm, subsequent encounter: Secondary | ICD-10-CM | POA: Diagnosis not present

## 2016-04-12 DIAGNOSIS — H353210 Exudative age-related macular degeneration, right eye, stage unspecified: Secondary | ICD-10-CM | POA: Diagnosis not present

## 2016-04-12 DIAGNOSIS — H353130 Nonexudative age-related macular degeneration, bilateral, stage unspecified: Secondary | ICD-10-CM | POA: Diagnosis not present

## 2016-05-17 DIAGNOSIS — H353120 Nonexudative age-related macular degeneration, left eye, stage unspecified: Secondary | ICD-10-CM | POA: Diagnosis not present

## 2016-05-17 DIAGNOSIS — H353210 Exudative age-related macular degeneration, right eye, stage unspecified: Secondary | ICD-10-CM | POA: Diagnosis not present

## 2016-06-09 DIAGNOSIS — I471 Supraventricular tachycardia, unspecified: Secondary | ICD-10-CM

## 2016-06-09 HISTORY — DX: Supraventricular tachycardia, unspecified: I47.10

## 2016-06-09 HISTORY — DX: Supraventricular tachycardia: I47.1

## 2016-06-28 DIAGNOSIS — H01001 Unspecified blepharitis right upper eyelid: Secondary | ICD-10-CM | POA: Diagnosis not present

## 2016-06-28 DIAGNOSIS — H43812 Vitreous degeneration, left eye: Secondary | ICD-10-CM | POA: Diagnosis not present

## 2016-06-28 DIAGNOSIS — H01004 Unspecified blepharitis left upper eyelid: Secondary | ICD-10-CM | POA: Diagnosis not present

## 2016-06-28 DIAGNOSIS — H01002 Unspecified blepharitis right lower eyelid: Secondary | ICD-10-CM | POA: Diagnosis not present

## 2016-06-28 DIAGNOSIS — H353120 Nonexudative age-related macular degeneration, left eye, stage unspecified: Secondary | ICD-10-CM | POA: Diagnosis not present

## 2016-06-28 DIAGNOSIS — H353210 Exudative age-related macular degeneration, right eye, stage unspecified: Secondary | ICD-10-CM | POA: Diagnosis not present

## 2016-06-28 DIAGNOSIS — H01005 Unspecified blepharitis left lower eyelid: Secondary | ICD-10-CM | POA: Diagnosis not present

## 2016-06-28 DIAGNOSIS — Z961 Presence of intraocular lens: Secondary | ICD-10-CM | POA: Diagnosis not present

## 2016-07-04 ENCOUNTER — Observation Stay (HOSPITAL_COMMUNITY)
Admission: EM | Admit: 2016-07-04 | Discharge: 2016-07-05 | Disposition: A | Discharge status: Home / Self Care | Payer: Medicare HMO | Attending: Internal Medicine | Admitting: Internal Medicine

## 2016-07-04 ENCOUNTER — Other Ambulatory Visit: Payer: Self-pay

## 2016-07-04 ENCOUNTER — Encounter (HOSPITAL_COMMUNITY): Payer: Self-pay

## 2016-07-04 ENCOUNTER — Emergency Department (HOSPITAL_COMMUNITY): Payer: Medicare HMO

## 2016-07-04 DIAGNOSIS — D649 Anemia, unspecified: Secondary | ICD-10-CM | POA: Insufficient documentation

## 2016-07-04 DIAGNOSIS — R778 Other specified abnormalities of plasma proteins: Secondary | ICD-10-CM | POA: Diagnosis present

## 2016-07-04 DIAGNOSIS — R000 Tachycardia, unspecified: Secondary | ICD-10-CM | POA: Diagnosis not present

## 2016-07-04 DIAGNOSIS — N400 Enlarged prostate without lower urinary tract symptoms: Secondary | ICD-10-CM | POA: Diagnosis not present

## 2016-07-04 DIAGNOSIS — Z8679 Personal history of other diseases of the circulatory system: Secondary | ICD-10-CM | POA: Diagnosis present

## 2016-07-04 DIAGNOSIS — R0902 Hypoxemia: Secondary | ICD-10-CM | POA: Diagnosis not present

## 2016-07-04 DIAGNOSIS — Z79899 Other long term (current) drug therapy: Secondary | ICD-10-CM | POA: Insufficient documentation

## 2016-07-04 DIAGNOSIS — R7989 Other specified abnormal findings of blood chemistry: Secondary | ICD-10-CM

## 2016-07-04 DIAGNOSIS — I471 Supraventricular tachycardia: Secondary | ICD-10-CM | POA: Diagnosis not present

## 2016-07-04 DIAGNOSIS — R748 Abnormal levels of other serum enzymes: Secondary | ICD-10-CM | POA: Diagnosis not present

## 2016-07-04 DIAGNOSIS — R069 Unspecified abnormalities of breathing: Secondary | ICD-10-CM | POA: Diagnosis not present

## 2016-07-04 LAB — URINALYSIS, ROUTINE W REFLEX MICROSCOPIC
BILIRUBIN URINE: NEGATIVE
Glucose, UA: NEGATIVE mg/dL
Hgb urine dipstick: NEGATIVE
KETONES UR: NEGATIVE mg/dL
Leukocytes, UA: NEGATIVE
NITRITE: NEGATIVE
PROTEIN: NEGATIVE mg/dL
SPECIFIC GRAVITY, URINE: 1.019 (ref 1.005–1.030)
pH: 5 (ref 5.0–8.0)

## 2016-07-04 LAB — CBC WITH DIFFERENTIAL/PLATELET
BASOS ABS: 0 10*3/uL (ref 0.0–0.1)
BASOS PCT: 0 %
EOS ABS: 0.1 10*3/uL (ref 0.0–0.7)
Eosinophils Relative: 3 %
HCT: 37.3 % — ABNORMAL LOW (ref 39.0–52.0)
HEMOGLOBIN: 12.4 g/dL — AB (ref 13.0–17.0)
Lymphocytes Relative: 23 %
Lymphs Abs: 1.3 10*3/uL (ref 0.7–4.0)
MCH: 33 pg (ref 26.0–34.0)
MCHC: 33.2 g/dL (ref 30.0–36.0)
MCV: 99.2 fL (ref 78.0–100.0)
Monocytes Absolute: 0.6 10*3/uL (ref 0.1–1.0)
Monocytes Relative: 10 %
NEUTROS PCT: 64 %
Neutro Abs: 3.7 10*3/uL (ref 1.7–7.7)
PLATELETS: 235 10*3/uL (ref 150–400)
RBC: 3.76 MIL/uL — AB (ref 4.22–5.81)
RDW: 14 % (ref 11.5–15.5)
WBC: 5.7 10*3/uL (ref 4.0–10.5)

## 2016-07-04 LAB — I-STAT TROPONIN, ED: TROPONIN I, POC: 0.09 ng/mL — AB (ref 0.00–0.08)

## 2016-07-04 LAB — D-DIMER, QUANTITATIVE (NOT AT ARMC): D DIMER QUANT: 0.67 ug{FEU}/mL — AB (ref 0.00–0.50)

## 2016-07-04 LAB — BASIC METABOLIC PANEL
Anion gap: 8 (ref 5–15)
BUN: 33 mg/dL — ABNORMAL HIGH (ref 6–20)
CHLORIDE: 103 mmol/L (ref 101–111)
CO2: 26 mmol/L (ref 22–32)
Calcium: 8.7 mg/dL — ABNORMAL LOW (ref 8.9–10.3)
Creatinine, Ser: 1.04 mg/dL (ref 0.61–1.24)
Glucose, Bld: 92 mg/dL (ref 65–99)
POTASSIUM: 4.3 mmol/L (ref 3.5–5.1)
SODIUM: 137 mmol/L (ref 135–145)

## 2016-07-04 LAB — TROPONIN I
TROPONIN I: 0.03 ng/mL — AB (ref <0.03)
Troponin I: 0.09 ng/mL (ref <0.03)

## 2016-07-04 MED ORDER — SODIUM CHLORIDE 0.9 % IV SOLN
INTRAVENOUS | Status: DC
  Administered 2016-07-04: via INTRAVENOUS

## 2016-07-04 MED ORDER — ACETAMINOPHEN 325 MG PO TABS: 650.0000 mg | ORAL_TABLET | Freq: Once | ORAL | Status: DC

## 2016-07-04 MED ORDER — ACETAMINOPHEN 325 MG PO TABS
650.0000 mg | ORAL_TABLET | Freq: Once | ORAL | Status: AC
  Administered 2016-07-04: 650 mg via ORAL
  Filled 2016-07-04: qty 2

## 2016-07-04 NOTE — ED Triage Notes (Signed)
Patient coming from [facility] [place] for SVT that converted with vagal maneuvers. HR was in the 170s and post conversion is 90s.  Non diaphoretic and other vitals stable.  Patient A&Ox4 at this time.

## 2016-07-04 NOTE — ED Notes (Signed)
Date and time results received: 07/04/16 2050 (use smartphrase ".now" to insert current time)  Test: trop Critical Value: 0.03  Name of Provider Notified: Dr. Eulis Foster  Orders Received? Or Actions Taken?: Provider in department, will see PT face to face

## 2016-07-04 NOTE — ED Notes (Signed)
Care handoff to Jazmine RN 

## 2016-07-04 NOTE — ED Notes (Signed)
Patient also stated that he has had periods of feeling short of breath on and off for the past 2 days.

## 2016-07-04 NOTE — ED Notes (Signed)
Patient transported to X-ray 

## 2016-07-04 NOTE — ED Provider Notes (Signed)
[facility] DEPT Provider Note   CSN: [id number] Arrival date & time: 07/04/16  1936     History   Chief Complaint Chief Complaint  Patient presents with  . Tachycardia  . Shortness of Breath    HPI Travis Kelly is a 83 y.o. male.  He was relaxing today listening to an audio book, when he felt palpitations.  He did not have associated chest pain.  EMS was called, and he is with family members when they arrived.  His heart rate in the cardiac monitor indicated SVT, and EMS providers recommended vagal maneuvers, which successfully converted him to normal sinus rhythm.  No preceding symptoms.  No recent fever, chills, cough, shortness of breath, nausea or vomiting.  No similar problem in the past.  There are no other known modifying factors.  HPI  History reviewed. No pertinent past medical history.  There are no active problems to display for this patient.   History reviewed. No pertinent surgical history.     Home Medications    Prior to Admission medications   Not on File    Family History History reviewed. No pertinent family history.  Social History Social History  Substance Use Topics  . Smoking status: Never Smoker  . Smokeless tobacco: Never Used  . Alcohol use Not on file     Allergies   Patient has no known allergies.   Review of Systems Review of Systems  All other systems reviewed and are negative.    Physical Exam Updated Vital Signs BP 97/70   Pulse 86   Resp (!) 25   SpO2 100%   Physical Exam  Constitutional: He is oriented to person, place, and time. He appears well-developed. No distress.  Elderly, frail  HENT:  Head: Normocephalic and atraumatic.  Right Ear: External ear normal.  Left Ear: External ear normal.  Eyes: Conjunctivae and EOM are normal. Pupils are equal, round, and reactive to light.  Neck: Normal range of motion and phonation normal. Neck supple.  Cardiovascular: Normal rate, regular rhythm and normal heart  sounds.   Pulmonary/Chest: Effort normal. No respiratory distress. He has no wheezes. He has no rales. He exhibits no bony tenderness.  Rales left mid and left base.  Abdominal: Soft. There is no tenderness.  Musculoskeletal: Normal range of motion. He exhibits no edema.  Neurological: He is alert and oriented to person, place, and time. No cranial nerve deficit or sensory deficit. He exhibits normal muscle tone. Coordination normal.  Skin: Skin is warm, dry and intact.  Psychiatric: He has a normal mood and affect. His behavior is normal. Judgment and thought content normal.  Nursing note and vitals reviewed.    ED Treatments / Results  Labs (all labs ordered are listed, but only abnormal results are displayed) Labs Reviewed  URINALYSIS, ROUTINE W REFLEX MICROSCOPIC - Abnormal; Notable for the following:       Result Value   APPearance HAZY (*)    All other components within normal limits  CBC WITH DIFFERENTIAL/PLATELET - Abnormal; Notable for the following:    RBC 3.76 (*)    Hemoglobin 12.4 (*)    HCT 37.3 (*)    All other components within normal limits  BASIC METABOLIC PANEL - Abnormal; Notable for the following:    BUN 33 (*)    Calcium 8.7 (*)    All other components within normal limits  TROPONIN I - Abnormal; Notable for the following:    Troponin I 0.03 (*)  All other components within normal limits  D-DIMER, QUANTITATIVE (NOT AT [facility]) - Abnormal; Notable for the following:    D-Dimer, Quant 0.67 (*)    All other components within normal limits    EKG  EKG Interpretation  Date/Time:  Monday July 04 2016 19:49:57 EDT Ventricular Rate:  90 PR Interval:  178 QRS Duration: 108 QT Interval:  382 QTC Calculation: 467 R Axis:   -58 Text Interpretation:  Sinus rhythm with Premature atrial complexes Left anterior fascicular block Septal infarct , age undetermined Abnormal ECG No old tracing to compare Confirmed by [facility]  MD, Achaia Garlock [id number]) on 07/04/2016 7:52:39 PM        Radiology Dg Chest 2 View  Result Date: 07/04/2016 CLINICAL DATA:  Dyspnea with tachycardia EXAM: CHEST  2 VIEW COMPARISON:  None. FINDINGS: Top normal size cardiac silhouette. Aortic atherosclerosis without aneurysm. Clear lungs with minimal bibasilar atelectasis. Osteoarthritis of the left glenohumeral joint with high-riding humeral heads. Degenerate change noted along the dorsal spine. IMPRESSION: 1. No active cardiopulmonary disease. 2. Aortic atherosclerosis. 3. 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 4. 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 Electronically Signed   By: Ashley Royalty M.D.   On: 07/04/2016 20:50    Procedures Procedures (including critical care time)  Medications Ordered in ED Medications  0.9 %  sodium chloride infusion (not administered)     Initial Impression / Assessment and Plan / ED Course  I have reviewed the triage vital signs and the nursing notes.  Pertinent labs & imaging results that were available during my care of the patient were reviewed by me and considered in my medical decision making (see chart for details).  Clinical Course as of Jul 05 5  Mon Jul 04, 2016  2318 Case discussed with cardiology who recommends admit by hospitalist, and consult them if needed.  Suggested doing a cardiac echo.  [EW]    Clinical Course User Index [EW] Daleen Bo, MD    Medications  0.9 %  sodium chloride infusion ( Intravenous New Bag/Given 07/04/16 2351)  acetaminophen (TYLENOL) tablet 650 mg (650 mg Oral Not Given 07/04/16 2333)  iopamidol (ISOVUE-370) 76 % injection (not administered)  acetaminophen (TYLENOL) tablet 650 mg (650 mg Oral Given 07/04/16 2302)    Patient Vitals for the past 24 hrs:  BP Pulse Resp SpO2  07/04/16 2300 110/72 73 (!) 23 96 %  07/04/16 2245 105/66 81 (!) 26 97 %  07/04/16 2230 (!) 99/59 82 (!) 22 96 %  07/04/16 2200 107/74 81 11 97 %  07/04/16 2145 (!) 101/59 81 17 97 %  07/04/16 2015  97/70 86 (!) 25 100 %  07/04/16 2000 107/73 88 (!) 24 100 %  07/04/16 1945 110/76 88 (!) 26 100 %  07/04/16 1939 117/78 (!) 113 (!) 34 (!) 84 %  07/04/16 1925 - - - 99 %    22: 50 reevaluation with update and discussion. After initial assessment and treatment, an updated evaluation reveals he remains comfortable and has no further complaints.  Still in sinus rhythm.  Findings discussed with patient and family members, all questions answered. Jodye Scali L    Final Clinical Impressions(s) / ED Diagnoses   Final diagnoses:  SVT (supraventricular tachycardia) ([place])   Episode of SVT, converted with vagal maneuvers.  Mild troponin elevation, likely leak, not occlusion or infarct.  Mild persistent tachypnea with single transient hypoxic reading on O2 saturation monitoring.  Patient will require further evaluation and admission with  monitoring.  Nursing Notes Reviewed/ Care Coordinated Applicable Imaging Reviewed Interpretation of Laboratory Data incorporated into ED treatment  Plan: Admit   New Prescriptions New Prescriptions   No medications on file     Daleen Bo, MD 07/05/16 [id number]

## 2016-07-04 NOTE — ED Notes (Signed)
ED Provider at bedside. 

## 2016-07-05 ENCOUNTER — Observation Stay (HOSPITAL_COMMUNITY): Payer: Medicare HMO

## 2016-07-05 ENCOUNTER — Encounter (HOSPITAL_COMMUNITY): Payer: Self-pay | Admitting: Internal Medicine

## 2016-07-05 ENCOUNTER — Observation Stay (HOSPITAL_BASED_OUTPATIENT_CLINIC_OR_DEPARTMENT_OTHER): Payer: Medicare HMO

## 2016-07-05 DIAGNOSIS — R7889 Finding of other specified substances, not normally found in blood: Secondary | ICD-10-CM

## 2016-07-05 DIAGNOSIS — R778 Other specified abnormalities of plasma proteins: Secondary | ICD-10-CM | POA: Diagnosis present

## 2016-07-05 DIAGNOSIS — D649 Anemia, unspecified: Secondary | ICD-10-CM | POA: Diagnosis present

## 2016-07-05 DIAGNOSIS — R000 Tachycardia, unspecified: Secondary | ICD-10-CM | POA: Diagnosis not present

## 2016-07-05 DIAGNOSIS — I471 Supraventricular tachycardia: Secondary | ICD-10-CM | POA: Diagnosis not present

## 2016-07-05 DIAGNOSIS — R748 Abnormal levels of other serum enzymes: Secondary | ICD-10-CM

## 2016-07-05 DIAGNOSIS — R7989 Other specified abnormal findings of blood chemistry: Secondary | ICD-10-CM

## 2016-07-05 LAB — CBC
HCT: 33.7 % — ABNORMAL LOW (ref 39.0–52.0)
HEMATOCRIT: 34.6 % — AB (ref 39.0–52.0)
Hemoglobin: 11.6 g/dL — ABNORMAL LOW (ref 13.0–17.0)
Hemoglobin: 11.7 g/dL — ABNORMAL LOW (ref 13.0–17.0)
MCH: 33.4 pg (ref 26.0–34.0)
MCH: 34.2 pg — AB (ref 26.0–34.0)
MCHC: 33.5 g/dL (ref 30.0–36.0)
MCHC: 34.7 g/dL (ref 30.0–36.0)
MCV: 99.7 fL (ref 78.0–100.0)
MCV: 98.5 fL (ref 78.0–100.0)
PLATELETS: 231 10*3/uL (ref 150–400)
PLATELETS: 201 10*3/uL (ref 150–400)
RBC: 3.47 MIL/uL — ABNORMAL LOW (ref 4.22–5.81)
RBC: 3.42 MIL/uL — AB (ref 4.22–5.81)
RDW: 14.4 % (ref 11.5–15.5)
RDW: 14.4 % (ref 11.5–15.5)
WBC: 5.8 10*3/uL (ref 4.0–10.5)
WBC: 5.8 10*3/uL (ref 4.0–10.5)

## 2016-07-05 LAB — TSH: TSH: 3.499 u[IU]/mL (ref 0.350–4.500)

## 2016-07-05 LAB — BASIC METABOLIC PANEL
Anion gap: 9 (ref 5–15)
BUN: 27 mg/dL — ABNORMAL HIGH (ref 6–20)
CO2: 26 mmol/L (ref 22–32)
Calcium: 8.9 mg/dL (ref 8.9–10.3)
Chloride: 102 mmol/L (ref 101–111)
Creatinine, Ser: 0.91 mg/dL (ref 0.61–1.24)
GFR calc Af Amer: >60 mL/min (ref >60)
GLUCOSE: 121 mg/dL — AB (ref 65–99)
POTASSIUM: 4.2 mmol/L (ref 3.5–5.1)
Sodium: 137 mmol/L (ref 135–145)

## 2016-07-05 LAB — CREATININE, SERUM
Creatinine, Ser: 1.02 mg/dL (ref 0.61–1.24)
GFR calc Af Amer: >60 mL/min (ref >60)

## 2016-07-05 LAB — ECHOCARDIOGRAM COMPLETE
Height: 65 in
Weight: 2699.2 oz

## 2016-07-05 LAB — TROPONIN I
TROPONIN I: 0.12 ng/mL — AB (ref <0.03)
Troponin I: 0.17 ng/mL (ref <0.03)

## 2016-07-05 MED ORDER — VITAMIN C 500 MG PO TABS
500.0000 mg | ORAL_TABLET | Freq: Every day | ORAL | Status: DC
  Administered 2016-07-05: 500 mg via ORAL
  Filled 2016-07-05: qty 1

## 2016-07-05 MED ORDER — SODIUM CHLORIDE 0.9 % IV SOLN: INTRAVENOUS | Status: DC

## 2016-07-05 MED ORDER — OCUVITE EYE HEALTH FORMULA PO CAPS: 1.0000 | ORAL_CAPSULE | Freq: Every day | ORAL | Status: DC

## 2016-07-05 MED ORDER — ONDANSETRON HCL 4 MG PO TABS: 4.0000 mg | ORAL_TABLET | Freq: Four times a day (QID) | ORAL | Status: DC | PRN

## 2016-07-05 MED ORDER — ADULT MULTIVITAMIN W/MINERALS CH
1.0000 | ORAL_TABLET | Freq: Every day | ORAL | Status: DC
  Administered 2016-07-05: 1 via ORAL
  Filled 2016-07-05: qty 1

## 2016-07-05 MED ORDER — VITAMIN D 1000 UNITS PO TABS
2000.0000 [IU] | ORAL_TABLET | Freq: Every day | ORAL | Status: DC
  Administered 2016-07-05: 2000 [IU] via ORAL
  Filled 2016-07-05: qty 2

## 2016-07-05 MED ORDER — FINASTERIDE 5 MG PO TABS
5.0000 mg | ORAL_TABLET | Freq: Every day | ORAL | Status: DC
  Filled 2016-07-05: qty 1

## 2016-07-05 MED ORDER — PANTOPRAZOLE SODIUM 40 MG PO TBEC
40.0000 mg | DELAYED_RELEASE_TABLET | Freq: Every day | ORAL | Status: DC
  Filled 2016-07-05: qty 1

## 2016-07-05 MED ORDER — LUTEIN 20 PO CAPS: 20.0000 mg | ORAL_CAPSULE | Freq: Every day | ORAL | Status: DC

## 2016-07-05 MED ORDER — IOPAMIDOL (ISOVUE-370) INJECTION 76%
INTRAVENOUS | Status: AC
  Administered 2016-07-05: 100 mL
  Filled 2016-07-05: qty 100

## 2016-07-05 MED ORDER — ACETAMINOPHEN 650 MG RE SUPP: 650.0000 mg | Freq: Four times a day (QID) | RECTAL | Status: DC | PRN

## 2016-07-05 MED ORDER — ENOXAPARIN SODIUM 40 MG/0.4ML ~~LOC~~ SOLN
40.0000 mg | SUBCUTANEOUS | Status: DC
  Filled 2016-07-05: qty 0.4

## 2016-07-05 MED ORDER — ACETAMINOPHEN 325 MG PO TABS: 650.0000 mg | ORAL_TABLET | Freq: Four times a day (QID) | ORAL | Status: DC | PRN

## 2016-07-05 MED ORDER — PROSIGHT PO TABS
1.0000 | ORAL_TABLET | Freq: Every day | ORAL | Status: DC
  Administered 2016-07-05: 1 via ORAL
  Filled 2016-07-05: qty 1

## 2016-07-05 MED ORDER — ONDANSETRON HCL 4 MG/2ML IJ SOLN: 4.0000 mg | Freq: Four times a day (QID) | INTRAMUSCULAR | Status: DC | PRN

## 2016-07-05 MED ORDER — METOPROLOL TARTRATE 25 MG PO TABS: 25.0000 mg | ORAL_TABLET | ORAL | 1 refills | Status: DC | PRN

## 2016-07-05 MED ORDER — OXYCODONE-ACETAMINOPHEN 5-325 MG PO TABS
2.0000 | ORAL_TABLET | Freq: Once | ORAL | Status: AC
  Administered 2016-07-05: 2 via ORAL
  Filled 2016-07-05: qty 2

## 2016-07-05 NOTE — Discharge Summary (Signed)
Physician Discharge Summary  Travis Kelly TOI:[id number] DOB: 01/21/1923 DOA: 07/04/2016  PCP: No PCP Per Patient  Admit date: 07/04/2016 Discharge date: 07/05/2016  Time spent: 35 minutes  Recommendations for Outpatient Follow-up:  1. Primary Cardiologist Dr.David Bohle in 2 weeks   Discharge Diagnoses:  Active Problems:   SVT (supraventricular tachycardia) (HCC)   Moderate mitral regurgitation   Elevated troponin   Normochromic normocytic anemia   Discharge Condition: stable  Diet recommendation: heart healthy  Filed Weights   07/05/16 0127  Weight: 76.5 kg (168 lb 11.2 oz)    History of present illness:  : Travis Kelly is a 83 y.o. male with history of mitral regurgitation, BPH has been experiencing weakness and dizziness since last 24 hours. Last evening while helping his wife patient started feeling palpitations and weakness again. He checked his heart rate was elevated EMS was called and patient was found to be in SVT. Patient was brought to the ER. En route vagal maneuvers converted back to sinus rhythm.  In ED patient was found to be mildly hypoxic and had CT angiogram of the chest done which was unremarkable. Troponin was mildly elevated EKG was showing normal sinus rhythm  Hospital Course:  1. SVT /Dizziness/palpitations - resolved with vagal maneuvers prior to admission - TSH was normal, CTA chest to r/o PE was negative for acute findings - 2-D echo showed atleast moderate mitral regurgitation, he has known moderate MR,  troponin was mildly elevated at 0.07-0.17 range, EKG without acute findings and pt remains without chest pain or any other evidence of ACS, this was felt to be due to demand from SVT. - remains in NSR with HR in 57-70 range, I called and discussed cardiology Dr.Croitoru-who recommended low dose beta blocker PRN for palpitations/SVT and to FU with Cardiology in the office.pt already has an appt with Dr.David Salvadore Oxford his cardiologist in 2 weeks. -improved  and stable without symptoms, ambulated with Physical therapy too . 2. Normocytic normochromic anemia   3. BPH. -continue proscar  Procedures:  ECHO  Consultations:  None, called and d/w Dr.Croitoru-cardiology  Discharge Exam: Vitals:   07/05/16 0800 07/05/16 1157  BP: (!) 115/57 (!) 106/51  Pulse: (!) 59 (!) 57  Resp: 20 18  Temp: 98 F (36.7 C) 98.6 F (37 C)    General: AAOx3 Cardiovascular: S1S2/RRR Respiratory: CTAB  Discharge Instructions   Discharge Instructions    Diet - low sodium heart healthy    Complete by:  As directed    Increase activity slowly    Complete by:  As directed      Current Discharge Medication List    START taking these medications   Details  metoprolol tartrate (LOPRESSOR) 25 MG tablet Take 1 tablet (25 mg total) by mouth as needed. Can take it once as needed for Rapid heart rate, palpitations Qty: 15 tablet, Refills: 1      CONTINUE these medications which have NOT CHANGED   Details  Cholecalciferol (VITAMIN D) 2000 units CAPS Take 2,000 Units by mouth daily.    finasteride (PROSCAR) 5 MG tablet Take 5 mg by mouth daily.    Glucosamine HCl 1000 MG TABS Take 1 tablet by mouth daily.    meloxicam (MOBIC) 15 MG tablet Take 15 mg by mouth daily as needed for pain.    !! Misc Natural Products (LUTEIN 20) CAPS Take 20 mg by mouth daily.    !! Misc Natural Products (TURMERIC CURCUMIN) CAPS Take 1 capsule by mouth daily.  Multiple Vitamin (MULTIVITAMIN) tablet Take 1 tablet by mouth daily.    Multiple Vitamins-Minerals (OCUVITE EYE HEALTH FORMULA) CAPS Take 1 tablet by mouth daily.    omeprazole (PRILOSEC) 20 MG capsule Take 20 mg by mouth daily as needed for heartburn.    RA KRILL OIL 500 MG CAPS Take 500 mg by mouth daily.    vitamin C (ASCORBIC ACID) 500 MG tablet Take 500 mg by mouth daily.     !! - Potential duplicate medications found. Please discuss with provider.     No Known Allergies Follow-up Information     Dina Rich, MD Follow up in 2 week(s).   Specialty:  Cardiology Contact information: [place] [address] [phone]            The results of significant diagnostics from this hospitalization (including imaging, microbiology, ancillary and laboratory) are listed below for reference.    Significant Diagnostic Studies: Dg Chest 2 View  Result Date: 07/04/2016 CLINICAL DATA:  Dyspnea with tachycardia EXAM: CHEST  2 VIEW COMPARISON:  None. FINDINGS: Top normal size cardiac silhouette. Aortic atherosclerosis without aneurysm. Clear lungs with minimal bibasilar atelectasis. Osteoarthritis of the left glenohumeral joint with high-riding humeral heads. Degenerate change noted along the dorsal spine. IMPRESSION: 1. No active cardiopulmonary disease. 2. Aortic atherosclerosis. 3. 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 4. 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 Electronically Signed   By: Ashley Royalty M.D.   On: 07/04/2016 20:50   Ct Angio Chest Pe W/cm &/or Wo Cm  Result Date: 07/05/2016 CLINICAL DATA:  83 year old with palpitations. EXAM: CT ANGIOGRAPHY CHEST WITH CONTRAST TECHNIQUE: Multidetector CT imaging of the chest was performed using the standard protocol during bolus administration of intravenous contrast. Multiplanar CT image reconstructions and MIPs were obtained to evaluate the vascular anatomy. CONTRAST:  80 cc Isovue 370 IV COMPARISON:  Chest radiographs earlier this day. FINDINGS: Cardiovascular: There are no filling defects within the pulmonary arteries to suggest pulmonary embolus. Atherosclerosis and tortuosity of the thoracic aorta without aneurysm. Phase of contrast limits assessment for dissection, no periaortic soft tissue stranding. Mild left atrial enlargement. Scattered coronary artery calcifications. Mediastinum/Nodes: No pericardial effusion. No mediastinal or hilar adenopathy. Visualized thyroid gland is normal.  The esophagus is decompressed. Minimal hiatal hernia. Lungs/Pleura: Scattered subsegmental atelectasis. No consolidation. No evidence of pulmonary edema. No pulmonary mass or suspicious nodule. Minimal lower lobe bronchial thickening. No pleural fluid. Upper Abdomen: No acute abnormality. Musculoskeletal: Degenerative change in the spine and both shoulders. There are no acute or suspicious osseous abnormalities. Lucency within left anterior third rib has no aggressive characteristics. Review of the MIP images confirms the above findings. IMPRESSION: 1. No pulmonary embolus. 2. Mild left atrial enlargement. Thoracic aortic atherosclerosis. Scattered coronary artery calcifications. 3. Mild lower lobe bronchial thickening, of unknown chronicity. Electronically Signed   By: Jeb Levering M.D.   On: 07/05/2016 00:54    Microbiology: No results found for this or any previous visit (from the past 240 hour(s)).   Labs: Basic Metabolic Panel:  Recent Labs Lab 07/04/16 1953 07/05/16 0209 07/05/16 1259  NA 137  --  137  K 4.3  --  4.2  CL 103  --  102  CO2 26  --  26  GLUCOSE 92  --  121*  BUN 33*  --  27*  CREATININE 1.04 1.02 0.91  CALCIUM 8.7*  --  8.9   Liver Function Tests: No results for input(s): AST, ALT, ALKPHOS,  BILITOT, PROT, ALBUMIN in the last 168 hours. No results for input(s): LIPASE, AMYLASE in the last 168 hours. No results for input(s): AMMONIA in the last 168 hours. CBC:  Recent Labs Lab 07/04/16 1953 07/05/16 0209 07/05/16 1132  WBC 5.7 5.8 5.8  NEUTROABS 3.7  --   --   HGB 12.4* 11.6* 11.7*  HCT 37.3* 34.6* 33.7*  MCV 99.2 99.7 98.5  PLT 235 231 201   Cardiac Enzymes:  Recent Labs Lab 07/04/16 1953 07/04/16 2233 07/05/16 0209 07/05/16 1259  TROPONINI 0.03* 0.09* 0.17* 0.12*   BNP: BNP (last 3 results) No results for input(s): BNP in the last 8760 hours.  ProBNP (last 3 results) No results for input(s): PROBNP in the last 8760 hours.  CBG: No  results for input(s): GLUCAP in the last 168 hours.     SignedDomenic Polite MD.  [facility] 07/05/2016, 2:39 PM

## 2016-07-05 NOTE — H&P (Signed)
History and Physical    [name]:[id number] DOB: 1922-10-24 DOA: 07/04/2016  PCP: No PCP Per Patient  Patient coming from: Home.  Chief Complaint: Palpitations.  HPI: Travis Kelly is a 83 y.o. male with history of mitral regurgitation, BPH has been experiencing weakness and dizziness since last 24 hours. Last evening while helping his wife patient started feeling palpitations and weakness again. He checked his heart rate was elevated EMS was called and patient was found to be in SVT. Patient was brought to the ER. En route vagal maneuvers converted back to sinus rhythm.    ED Course:  in the ER patient was found to be mildly hypoxic and had CT angiogram of the chest done which was unremarkable. Troponin was mildly elevated EKG was showing normal sinus rhythm. Patient has been admitted for further observation given the mild hypoxia and mildly elevated troponin.  Review of Systems: As per HPI, rest all negative.   History reviewed. No pertinent past medical history.  History reviewed. No pertinent surgical history.   reports that he has never smoked. He has never used smokeless tobacco. His alcohol and drug histories are not on file.  No Known Allergies  Family History  Problem Relation Age of Onset  . Cancer Mother   . Pneumonia Father     Prior to Admission medications   Medication Sig Start Date End Date Taking? Authorizing Provider  Cholecalciferol (VITAMIN D) 2000 units CAPS Take 2,000 Units by mouth daily.   Yes Historical Provider, MD  finasteride (PROSCAR) 5 MG tablet Take 5 mg by mouth daily. 07/25/13  Yes Historical Provider, MD  Glucosamine HCl 1000 MG TABS Take 1 tablet by mouth daily.   Yes Historical Provider, MD  meloxicam (MOBIC) 15 MG tablet Take 15 mg by mouth daily as needed for pain. 06/24/13  Yes Historical Provider, MD  Misc Natural Products (LUTEIN 20) CAPS Take 20 mg by mouth daily.   Yes Historical Provider, MD  Misc Natural Products (TURMERIC  CURCUMIN) CAPS Take 1 capsule by mouth daily.   Yes Historical Provider, MD  Multiple Vitamin (MULTIVITAMIN) tablet Take 1 tablet by mouth daily.   Yes Historical Provider, MD  Multiple Vitamins-Minerals ([place]) CAPS Take 1 tablet by mouth daily.   Yes Historical Provider, MD  omeprazole (PRILOSEC) 20 MG capsule Take 20 mg by mouth daily as needed for heartburn. 01/02/14  Yes Historical Provider, MD  RA KRILL OIL 500 MG CAPS Take 500 mg by mouth daily.   Yes Historical Provider, MD  vitamin C (ASCORBIC ACID) 500 MG tablet Take 500 mg by mouth daily.   Yes Historical Provider, MD    Physical Exam: Vitals:   07/04/16 2230 07/04/16 2245 07/04/16 2300 07/05/16 0000  BP: (!) 99/59 105/66 110/72 107/69  Pulse: 82 81 73 70  Resp: (!) 22 (!) 26 (!) 23 19  SpO2: 96% 97% 96% 97%      Constitutional: Moderately built and nourished. Vitals:   07/04/16 2230 07/04/16 2245 07/04/16 2300 07/05/16 0000  BP: (!) 99/59 105/66 110/72 107/69  Pulse: 82 81 73 70  Resp: (!) 22 (!) 26 (!) 23 19  SpO2: 96% 97% 96% 97%   Eyes: Anicteric no pallor. ENMT: No discharge from the ears eyes nose and mouth. Neck: No mass felt. No neck rigidity. No JVD appreciated. Respiratory: No rhonchi or crepitations. Cardiovascular: S1-S2 no murmurs appreciated. Abdomen: Soft nontender bowel sounds present. Musculoskeletal: No edema. Skin: No rash. Skin  appears warm. Neurologic: Alert awake oriented to time place and person. Moves all extremities. Psychiatric: Appears normal. Normal affect.   Labs on Admission: I have personally reviewed following labs and imaging studies  CBC:  Recent Labs Lab 07/04/16 1953  WBC 5.7  NEUTROABS 3.7  HGB 12.4*  HCT 37.3*  MCV 99.2  PLT 790   Basic Metabolic Panel:  Recent Labs Lab 07/04/16 1953  NA 137  K 4.3  CL 103  CO2 26  GLUCOSE 92  BUN 33*  CREATININE 1.04  CALCIUM 8.7*   GFR: CrCl cannot be calculated (Unknown ideal weight.). Liver  Function Tests: No results for input(s): AST, ALT, ALKPHOS, BILITOT, PROT, ALBUMIN in the last 168 hours. No results for input(s): LIPASE, AMYLASE in the last 168 hours. No results for input(s): AMMONIA in the last 168 hours. Coagulation Profile: No results for input(s): INR, PROTIME in the last 168 hours. Cardiac Enzymes:  Recent Labs Lab 07/04/16 1953 07/04/16 2233  TROPONINI 0.03* 0.09*   BNP (last 3 results) No results for input(s): PROBNP in the last 8760 hours. HbA1C: No results for input(s): HGBA1C in the last 72 hours. CBG: No results for input(s): GLUCAP in the last 168 hours. Lipid Profile: No results for input(s): CHOL, HDL, LDLCALC, TRIG, CHOLHDL, LDLDIRECT in the last 72 hours. Thyroid Function Tests: No results for input(s): TSH, T4TOTAL, FREET4, T3FREE, THYROIDAB in the last 72 hours. Anemia Panel: No results for input(s): VITAMINB12, FOLATE, FERRITIN, TIBC, IRON, RETICCTPCT in the last 72 hours. Urine analysis:    Component Value Date/Time   COLORURINE YELLOW 07/04/2016 2030   APPEARANCEUR HAZY (A) 07/04/2016 2030   LABSPEC 1.019 07/04/2016 2030   [place] 5.0 07/04/2016 2030   GLUCOSEU NEGATIVE 07/04/2016 2030   [place] NEGATIVE 07/04/2016 2030   [place] NEGATIVE 07/04/2016 2030   [place] 07/04/2016 2030   PROTEINUR NEGATIVE 07/04/2016 2030   NITRITE NEGATIVE 07/04/2016 2030   LEUKOCYTESUR NEGATIVE 07/04/2016 2030   Sepsis Labs: @LABRCNTIP (procalcitonin:4,lacticidven:4) )No results found for this or any previous visit (from the past 240 hour(s)).   Radiological Exams on Admission: Dg Chest 2 View  Result Date: 07/04/2016 CLINICAL DATA:  Dyspnea with tachycardia EXAM: CHEST  2 VIEW COMPARISON:  None. FINDINGS: Top normal size cardiac silhouette. Aortic atherosclerosis without aneurysm. Clear lungs with minimal bibasilar atelectasis. Osteoarthritis of the left glenohumeral joint with high-riding humeral heads. Degenerate change noted along  the dorsal spine. IMPRESSION: 1. No active cardiopulmonary disease. 2. Aortic atherosclerosis. 3. 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 4. 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 Electronically Signed   By: Ashley Royalty M.D.   On: 07/04/2016 20:50   Ct Angio Chest Pe W/cm &/or Wo Cm  Result Date: 07/05/2016 CLINICAL DATA:  83 year old with palpitations. EXAM: CT ANGIOGRAPHY CHEST WITH CONTRAST TECHNIQUE: Multidetector CT imaging of the chest was performed using the standard protocol during bolus administration of intravenous contrast. Multiplanar CT image reconstructions and MIPs were obtained to evaluate the vascular anatomy. CONTRAST:  80 cc Isovue 370 IV COMPARISON:  Chest radiographs earlier this day. FINDINGS: Cardiovascular: There are no filling defects within the pulmonary arteries to suggest pulmonary embolus. Atherosclerosis and tortuosity of the thoracic aorta without aneurysm. Phase of contrast limits assessment for dissection, no periaortic soft tissue stranding. Mild left atrial enlargement. Scattered coronary artery calcifications. Mediastinum/Nodes: No pericardial effusion. No mediastinal or hilar adenopathy. Visualized thyroid gland is normal. The esophagus is decompressed. Minimal hiatal hernia. Lungs/Pleura: Scattered subsegmental atelectasis. No consolidation. No  evidence of pulmonary edema. No pulmonary mass or suspicious nodule. Minimal lower lobe bronchial thickening. No pleural fluid. Upper Abdomen: No acute abnormality. Musculoskeletal: Degenerative change in the spine and both shoulders. There are no acute or suspicious osseous abnormalities. Lucency within left anterior third rib has no aggressive characteristics. Review of the MIP images confirms the above findings. IMPRESSION: 1. No pulmonary embolus. 2. Mild left atrial enlargement. Thoracic aortic atherosclerosis. Scattered coronary artery calcifications. 3. Mild lower lobe bronchial thickening, of  unknown chronicity. Electronically Signed   By: Jeb Levering M.D.   On: 07/05/2016 00:54    EKG: Independently reviewed. Normal sinus rhythm.  Assessment/Plan Active Problems:   SVT (supraventricular tachycardia) (HCC)   Elevated troponin   Normochromic normocytic anemia    1. SVT with elevated troponin - will check TSH. Check 2-D echo given history of mitral regurgitation. Cycle cardiac markers were elevated troponin probably secondary to elevated heart rate. Patient at this time is chest pain-free. Probably will need follow-up with EP cardiology. 2. History of mitral regurgitation - check 2-D echo. 3. Normocytic normochromic anemia - follow CBC and further workup as outpatient. 4. BPH.   DVT prophylaxis: Lovenox. Code Status: Full code.  Family Communication: Patient's daughter-in-law.  Disposition Plan: Home.  Consults called: None.  Admission status: Observation.    Rise Patience MD [facility] Pager [phone].  If 7PM-7AM, please contact night-coverage [email or web address] Password [email or web address]  07/05/2016, 1:12 AM

## 2016-07-05 NOTE — Progress Notes (Signed)
Pt states he takes his proscar 5mg  at bedtime, called pharmacy and changed the time  Eliyanah Elgersma Leory Plowman

## 2016-07-05 NOTE — Progress Notes (Signed)
MD paged regarding pt lab values not resulted. Paged phlebotomy regarding troponin, technician stated they were unable to stick pt and will send a phlebotomist to try again.  Travis Kelly

## 2016-07-05 NOTE — Care Management Note (Signed)
Case Management Note  Patient Details  Name: Travis Kelly MRN: [id number] Date of Birth: February 10, 1923  Subjective/Objective:                 Spoke with patient, son and PT at the bedside. Patient specifically asked to use [facility] after DC. Patient does not have DME needs. Spoke with Merleen Nicely at [facility], asked to addend order to include outpatient PT as that is how they are classified. Order addended, and faxed to [phone].   Action/Plan:   Expected Discharge Date:  07/05/16               Expected Discharge Plan:  Home/Self Care  In-House Referral:     Discharge planning Services  CM Consult  Post Acute Care Choice:    Choice offered to:  Patient  DME Arranged:    DME Agency:     HH Arranged:    [place] Agency:     Status of Service:  Completed, signed off  If discussed at [employer] of Stay Meetings, dates discussed:    Additional Comments:  Carles Collet, RN 07/05/2016, 3:15 PM

## 2016-07-05 NOTE — Progress Notes (Signed)
Educated the patient how heat therapy will help with his hand that is bothering him.

## 2016-07-05 NOTE — Progress Notes (Signed)
  Echocardiogram 2D Echocardiogram has been performed.  [name] M 07/05/2016, 10:42 AM

## 2016-07-05 NOTE — Evaluation (Signed)
Physical Therapy Evaluation Patient Details Name: Travis Kelly MRN: [id number] DOB: April 18, 1922 Today's Date: 07/05/2016   History of Present Illness  Pt 83 y.o. male with history of mitral regurgitation, BPH has been experiencing weakness and dizziness since last 24 hours. Last evening while helping his wife patient started feeling palpitations and weakness again. He checked his heart rate was elevated EMS was called and patient was found to be in SVT.  Clinical Impression  Pt reports there is a wellness center in his retirement center but he has not accessed recently.  His wife needs assist for transfers and ADLs and therefore has personal aide present to help her as he has limited ability to lift and pull.  He reports he is near baseline performance for gait although he appears to be SOB readily. Pt may therefore benefit from HHPT to incr his functional mobility to enable regular access to dining hall.    Follow Up Recommendations Home health PT;Supervision - Intermittent    Equipment Recommendations  None recommended by PT    Recommendations for Other Services       Precautions / Restrictions Precautions Precautions: Fall Precaution Comments: uses cane at home Restrictions Weight Bearing Restrictions: No      Mobility  Bed Mobility               General bed mobility comments: sitting to EOB upon arrival  Transfers Overall transfer level: Needs assistance Equipment used: 1 person hand held assist Transfers: Sit to/from Stand;Stand Pivot Transfers Sit to Stand: Supervision Stand pivot transfers: Supervision          Ambulation/Gait Ambulation/Gait assistance: Min assist Ambulation Distance (Feet): 250 Feet Assistive device: 1 person hand held assist Gait Pattern/deviations: Step-through pattern;Decreased step length - right;Decreased step length - left;Trunk flexed;Narrow base of support     General Gait Details: held to therapists Rt arm (linked  elbows)  Stairs            Wheelchair Mobility    Modified Rankin (Stroke Patients Only)       Balance Overall balance assessment: Needs assistance Sitting-balance support: No upper extremity supported;Feet supported Sitting balance-Leahy Scale: Good     Standing balance support: Single extremity supported Standing balance-Leahy Scale: Fair Standing balance comment: holds to rail as therapist adjusted pt's sock in standing                             Pertinent Vitals/Pain Pain Assessment: 0-10 Pain Score: 3  Pain Location: Rt wrist/hand Pain Descriptors / Indicators: Pins and needles;Aching Pain Intervention(s): Limited activity within patient's tolerance;Monitored during session    [place] expects to be discharged to:: Private residence Living Arrangements: Spouse/significant other Available Help at Discharge: Personal care attendant;Available PRN/intermittently Type of Home: Apartment Home Access: Level entry;Elevator     Home Layout: One level Home Equipment: Cane - single point;Shower seat;Shower seat - built in Additional Comments: lives in [place] retirement community. WIfe has personal aid in am and pm to assist with ADLs and transfers as she is w/c bound. THey take 2 meals/day in dining hall    Prior Function Level of Independence: Independent with assistive device(s)         Comments: uses cane for balance and security     Hand Dominance   Dominant Hand: Right    Extremity/Trunk Assessment   Upper Extremity Assessment Upper Extremity Assessment: Generalized weakness    Lower Extremity Assessment Lower  Extremity Assessment: Generalized weakness    Cervical / Trunk Assessment Cervical / Trunk Assessment: Kyphotic  Communication   Communication: HOH  Cognition Arousal/Alertness: Awake/alert Behavior During Therapy: WFL for tasks assessed/performed Overall Cognitive Status: Within Functional Limits for  tasks assessed                                        General Comments General comments (skin integrity, edema, etc.): HR 90 bpm, oxygen saturation on RA during gait 98%, DUE 3/4    Exercises     Assessment/Plan    PT Assessment All further PT needs can be met in the next venue of care  PT Problem List Decreased strength;Decreased activity tolerance;Decreased balance;Decreased mobility;Cardiopulmonary status limiting activity;Pain       PT Treatment Interventions      PT Goals (Current goals can be found in the Care Plan section)  Acute Rehab PT Goals Patient Stated Goal: get a little stronger PT Goal Formulation: With patient/family    Frequency     Barriers to discharge        Co-evaluation               End of Session Equipment Utilized During Treatment: Gait belt Activity Tolerance: Patient tolerated treatment well Patient left: in chair;with call bell/phone within reach;with family/visitor present Nurse Communication: Mobility status PT Visit Diagnosis: Muscle weakness (generalized) (M62.81);Unsteadiness on feet (R26.81)    Time: 7564-3329 PT Time Calculation (min) (ACUTE ONLY): 30 min   Charges:   PT Evaluation $PT Eval Low Complexity: 1 Procedure PT Treatments $Gait Training: 8-22 mins   PT G Codes:   PT G-Codes **NOT FOR INPATIENT CLASS** Functional Assessment Tool Used: AM-PAC 6 Clicks Basic Mobility;Clinical judgement Functional Limitation: Mobility: Walking and moving around Mobility: Walking and Moving Around Current Status (J1884): At least 40 percent but less than 60 percent impaired, limited or restricted Mobility: Walking and Moving Around Goal Status [phone]): At least 40 percent but less than 60 percent impaired, limited or restricted Mobility: Walking and Moving Around Discharge Status [phone]): At least 40 percent but less than 60 percent impaired, limited or restricted    Malka So,  [place] [phone]  [place] 07/05/2016, 3:46 PM

## 2016-07-05 NOTE — Progress Notes (Signed)
Pt discharge education completed at bedside with pt and son. Pt IV removed, catheter intact. Telemetry removed. Pt has all belongings, discharge paperwork and prescriptions. Pt discharged via wheelchair with RN   Glenard Keesling Leory Plowman

## 2016-07-07 DIAGNOSIS — R262 Difficulty in walking, not elsewhere classified: Secondary | ICD-10-CM | POA: Diagnosis not present

## 2016-07-07 DIAGNOSIS — R2681 Unsteadiness on feet: Secondary | ICD-10-CM | POA: Diagnosis not present

## 2016-07-07 DIAGNOSIS — M6281 Muscle weakness (generalized): Secondary | ICD-10-CM | POA: Diagnosis not present

## 2016-07-12 DIAGNOSIS — M6281 Muscle weakness (generalized): Secondary | ICD-10-CM | POA: Diagnosis not present

## 2016-07-12 DIAGNOSIS — R2681 Unsteadiness on feet: Secondary | ICD-10-CM | POA: Diagnosis not present

## 2016-07-12 DIAGNOSIS — R262 Difficulty in walking, not elsewhere classified: Secondary | ICD-10-CM | POA: Diagnosis not present

## 2016-07-13 DIAGNOSIS — M6281 Muscle weakness (generalized): Secondary | ICD-10-CM | POA: Diagnosis not present

## 2016-07-13 DIAGNOSIS — R262 Difficulty in walking, not elsewhere classified: Secondary | ICD-10-CM | POA: Diagnosis not present

## 2016-07-13 DIAGNOSIS — R2681 Unsteadiness on feet: Secondary | ICD-10-CM | POA: Diagnosis not present

## 2016-07-15 DIAGNOSIS — M6281 Muscle weakness (generalized): Secondary | ICD-10-CM | POA: Diagnosis not present

## 2016-07-15 DIAGNOSIS — R262 Difficulty in walking, not elsewhere classified: Secondary | ICD-10-CM | POA: Diagnosis not present

## 2016-07-15 DIAGNOSIS — R2681 Unsteadiness on feet: Secondary | ICD-10-CM | POA: Diagnosis not present

## 2016-07-19 DIAGNOSIS — I059 Rheumatic mitral valve disease, unspecified: Secondary | ICD-10-CM | POA: Diagnosis not present

## 2016-07-19 DIAGNOSIS — I471 Supraventricular tachycardia: Secondary | ICD-10-CM | POA: Diagnosis not present

## 2016-07-19 DIAGNOSIS — M6281 Muscle weakness (generalized): Secondary | ICD-10-CM | POA: Diagnosis not present

## 2016-07-19 DIAGNOSIS — I4460 Unspecified fascicular block: Secondary | ICD-10-CM | POA: Diagnosis not present

## 2016-07-19 DIAGNOSIS — R2681 Unsteadiness on feet: Secondary | ICD-10-CM | POA: Diagnosis not present

## 2016-07-19 DIAGNOSIS — R262 Difficulty in walking, not elsewhere classified: Secondary | ICD-10-CM | POA: Diagnosis not present

## 2016-07-20 DIAGNOSIS — M6281 Muscle weakness (generalized): Secondary | ICD-10-CM | POA: Diagnosis not present

## 2016-07-20 DIAGNOSIS — R2681 Unsteadiness on feet: Secondary | ICD-10-CM | POA: Diagnosis not present

## 2016-07-20 DIAGNOSIS — R262 Difficulty in walking, not elsewhere classified: Secondary | ICD-10-CM | POA: Diagnosis not present

## 2016-07-22 DIAGNOSIS — R2681 Unsteadiness on feet: Secondary | ICD-10-CM | POA: Diagnosis not present

## 2016-07-22 DIAGNOSIS — R262 Difficulty in walking, not elsewhere classified: Secondary | ICD-10-CM | POA: Diagnosis not present

## 2016-07-22 DIAGNOSIS — M6281 Muscle weakness (generalized): Secondary | ICD-10-CM | POA: Diagnosis not present

## 2016-07-25 DIAGNOSIS — M6281 Muscle weakness (generalized): Secondary | ICD-10-CM | POA: Diagnosis not present

## 2016-07-25 DIAGNOSIS — R262 Difficulty in walking, not elsewhere classified: Secondary | ICD-10-CM | POA: Diagnosis not present

## 2016-07-25 DIAGNOSIS — R2681 Unsteadiness on feet: Secondary | ICD-10-CM | POA: Diagnosis not present

## 2016-07-27 DIAGNOSIS — R2681 Unsteadiness on feet: Secondary | ICD-10-CM | POA: Diagnosis not present

## 2016-07-27 DIAGNOSIS — M6281 Muscle weakness (generalized): Secondary | ICD-10-CM | POA: Diagnosis not present

## 2016-07-27 DIAGNOSIS — R262 Difficulty in walking, not elsewhere classified: Secondary | ICD-10-CM | POA: Diagnosis not present

## 2016-07-29 DIAGNOSIS — R262 Difficulty in walking, not elsewhere classified: Secondary | ICD-10-CM | POA: Diagnosis not present

## 2016-07-29 DIAGNOSIS — M6281 Muscle weakness (generalized): Secondary | ICD-10-CM | POA: Diagnosis not present

## 2016-07-29 DIAGNOSIS — R2681 Unsteadiness on feet: Secondary | ICD-10-CM | POA: Diagnosis not present

## 2016-08-01 DIAGNOSIS — M6281 Muscle weakness (generalized): Secondary | ICD-10-CM | POA: Diagnosis not present

## 2016-08-01 DIAGNOSIS — R2681 Unsteadiness on feet: Secondary | ICD-10-CM | POA: Diagnosis not present

## 2016-08-01 DIAGNOSIS — R262 Difficulty in walking, not elsewhere classified: Secondary | ICD-10-CM | POA: Diagnosis not present

## 2016-08-03 DIAGNOSIS — R2681 Unsteadiness on feet: Secondary | ICD-10-CM | POA: Diagnosis not present

## 2016-08-03 DIAGNOSIS — R262 Difficulty in walking, not elsewhere classified: Secondary | ICD-10-CM | POA: Diagnosis not present

## 2016-08-03 DIAGNOSIS — M6281 Muscle weakness (generalized): Secondary | ICD-10-CM | POA: Diagnosis not present

## 2016-08-05 DIAGNOSIS — R262 Difficulty in walking, not elsewhere classified: Secondary | ICD-10-CM | POA: Diagnosis not present

## 2016-08-05 DIAGNOSIS — R2681 Unsteadiness on feet: Secondary | ICD-10-CM | POA: Diagnosis not present

## 2016-08-05 DIAGNOSIS — M6281 Muscle weakness (generalized): Secondary | ICD-10-CM | POA: Diagnosis not present

## 2016-08-08 DIAGNOSIS — M6281 Muscle weakness (generalized): Secondary | ICD-10-CM | POA: Diagnosis not present

## 2016-08-08 DIAGNOSIS — R262 Difficulty in walking, not elsewhere classified: Secondary | ICD-10-CM | POA: Diagnosis not present

## 2016-08-08 DIAGNOSIS — R2681 Unsteadiness on feet: Secondary | ICD-10-CM | POA: Diagnosis not present

## 2016-08-10 DIAGNOSIS — R262 Difficulty in walking, not elsewhere classified: Secondary | ICD-10-CM | POA: Diagnosis not present

## 2016-08-10 DIAGNOSIS — R2681 Unsteadiness on feet: Secondary | ICD-10-CM | POA: Diagnosis not present

## 2016-08-10 DIAGNOSIS — M6281 Muscle weakness (generalized): Secondary | ICD-10-CM | POA: Diagnosis not present

## 2016-08-12 DIAGNOSIS — R262 Difficulty in walking, not elsewhere classified: Secondary | ICD-10-CM | POA: Diagnosis not present

## 2016-08-12 DIAGNOSIS — R2681 Unsteadiness on feet: Secondary | ICD-10-CM | POA: Diagnosis not present

## 2016-08-12 DIAGNOSIS — M6281 Muscle weakness (generalized): Secondary | ICD-10-CM | POA: Diagnosis not present

## 2016-08-15 DIAGNOSIS — M6281 Muscle weakness (generalized): Secondary | ICD-10-CM | POA: Diagnosis not present

## 2016-08-15 DIAGNOSIS — R262 Difficulty in walking, not elsewhere classified: Secondary | ICD-10-CM | POA: Diagnosis not present

## 2016-08-15 DIAGNOSIS — R2681 Unsteadiness on feet: Secondary | ICD-10-CM | POA: Diagnosis not present

## 2016-08-17 DIAGNOSIS — M6281 Muscle weakness (generalized): Secondary | ICD-10-CM | POA: Diagnosis not present

## 2016-08-17 DIAGNOSIS — R2681 Unsteadiness on feet: Secondary | ICD-10-CM | POA: Diagnosis not present

## 2016-08-17 DIAGNOSIS — R262 Difficulty in walking, not elsewhere classified: Secondary | ICD-10-CM | POA: Diagnosis not present

## 2016-08-19 DIAGNOSIS — R2681 Unsteadiness on feet: Secondary | ICD-10-CM | POA: Diagnosis not present

## 2016-08-19 DIAGNOSIS — M6281 Muscle weakness (generalized): Secondary | ICD-10-CM | POA: Diagnosis not present

## 2016-08-19 DIAGNOSIS — R262 Difficulty in walking, not elsewhere classified: Secondary | ICD-10-CM | POA: Diagnosis not present

## 2016-08-23 DIAGNOSIS — N401 Enlarged prostate with lower urinary tract symptoms: Secondary | ICD-10-CM | POA: Diagnosis not present

## 2016-08-23 DIAGNOSIS — R3129 Other microscopic hematuria: Secondary | ICD-10-CM | POA: Diagnosis not present

## 2016-08-23 DIAGNOSIS — N200 Calculus of kidney: Secondary | ICD-10-CM | POA: Diagnosis not present

## 2016-08-24 DIAGNOSIS — R2681 Unsteadiness on feet: Secondary | ICD-10-CM | POA: Diagnosis not present

## 2016-08-24 DIAGNOSIS — M6281 Muscle weakness (generalized): Secondary | ICD-10-CM | POA: Diagnosis not present

## 2016-08-24 DIAGNOSIS — R262 Difficulty in walking, not elsewhere classified: Secondary | ICD-10-CM | POA: Diagnosis not present

## 2016-08-26 DIAGNOSIS — M6281 Muscle weakness (generalized): Secondary | ICD-10-CM | POA: Diagnosis not present

## 2016-08-26 DIAGNOSIS — R2681 Unsteadiness on feet: Secondary | ICD-10-CM | POA: Diagnosis not present

## 2016-08-26 DIAGNOSIS — R262 Difficulty in walking, not elsewhere classified: Secondary | ICD-10-CM | POA: Diagnosis not present

## 2016-08-31 DIAGNOSIS — R2681 Unsteadiness on feet: Secondary | ICD-10-CM | POA: Diagnosis not present

## 2016-08-31 DIAGNOSIS — R262 Difficulty in walking, not elsewhere classified: Secondary | ICD-10-CM | POA: Diagnosis not present

## 2016-08-31 DIAGNOSIS — M6281 Muscle weakness (generalized): Secondary | ICD-10-CM | POA: Diagnosis not present

## 2016-09-02 DIAGNOSIS — R2681 Unsteadiness on feet: Secondary | ICD-10-CM | POA: Diagnosis not present

## 2016-09-02 DIAGNOSIS — R262 Difficulty in walking, not elsewhere classified: Secondary | ICD-10-CM | POA: Diagnosis not present

## 2016-09-02 DIAGNOSIS — M6281 Muscle weakness (generalized): Secondary | ICD-10-CM | POA: Diagnosis not present

## 2016-09-07 DIAGNOSIS — R2681 Unsteadiness on feet: Secondary | ICD-10-CM | POA: Diagnosis not present

## 2016-09-07 DIAGNOSIS — R262 Difficulty in walking, not elsewhere classified: Secondary | ICD-10-CM | POA: Diagnosis not present

## 2016-09-07 DIAGNOSIS — M6281 Muscle weakness (generalized): Secondary | ICD-10-CM | POA: Diagnosis not present

## 2016-09-09 DIAGNOSIS — M6281 Muscle weakness (generalized): Secondary | ICD-10-CM | POA: Diagnosis not present

## 2016-09-09 DIAGNOSIS — R2681 Unsteadiness on feet: Secondary | ICD-10-CM | POA: Diagnosis not present

## 2016-09-09 DIAGNOSIS — R262 Difficulty in walking, not elsewhere classified: Secondary | ICD-10-CM | POA: Diagnosis not present

## 2016-09-12 DIAGNOSIS — R262 Difficulty in walking, not elsewhere classified: Secondary | ICD-10-CM | POA: Diagnosis not present

## 2016-09-12 DIAGNOSIS — R2681 Unsteadiness on feet: Secondary | ICD-10-CM | POA: Diagnosis not present

## 2016-09-12 DIAGNOSIS — M6281 Muscle weakness (generalized): Secondary | ICD-10-CM | POA: Diagnosis not present

## 2016-09-13 DIAGNOSIS — H35363 Drusen (degenerative) of macula, bilateral: Secondary | ICD-10-CM | POA: Diagnosis not present

## 2016-09-13 DIAGNOSIS — H353210 Exudative age-related macular degeneration, right eye, stage unspecified: Secondary | ICD-10-CM | POA: Diagnosis not present

## 2016-09-13 DIAGNOSIS — H01002 Unspecified blepharitis right lower eyelid: Secondary | ICD-10-CM | POA: Diagnosis not present

## 2016-09-13 DIAGNOSIS — H43812 Vitreous degeneration, left eye: Secondary | ICD-10-CM | POA: Diagnosis not present

## 2016-09-13 DIAGNOSIS — H01004 Unspecified blepharitis left upper eyelid: Secondary | ICD-10-CM | POA: Diagnosis not present

## 2016-09-13 DIAGNOSIS — H01005 Unspecified blepharitis left lower eyelid: Secondary | ICD-10-CM | POA: Diagnosis not present

## 2016-09-13 DIAGNOSIS — H01001 Unspecified blepharitis right upper eyelid: Secondary | ICD-10-CM | POA: Diagnosis not present

## 2016-09-13 DIAGNOSIS — H353122 Nonexudative age-related macular degeneration, left eye, intermediate dry stage: Secondary | ICD-10-CM | POA: Diagnosis not present

## 2016-09-13 DIAGNOSIS — Z961 Presence of intraocular lens: Secondary | ICD-10-CM | POA: Diagnosis not present

## 2016-09-14 DIAGNOSIS — R262 Difficulty in walking, not elsewhere classified: Secondary | ICD-10-CM | POA: Diagnosis not present

## 2016-09-14 DIAGNOSIS — R2681 Unsteadiness on feet: Secondary | ICD-10-CM | POA: Diagnosis not present

## 2016-09-14 DIAGNOSIS — M6281 Muscle weakness (generalized): Secondary | ICD-10-CM | POA: Diagnosis not present

## 2016-09-21 DIAGNOSIS — E785 Hyperlipidemia, unspecified: Secondary | ICD-10-CM | POA: Diagnosis not present

## 2016-09-21 DIAGNOSIS — I059 Rheumatic mitral valve disease, unspecified: Secondary | ICD-10-CM | POA: Diagnosis not present

## 2016-09-21 DIAGNOSIS — I4460 Unspecified fascicular block: Secondary | ICD-10-CM | POA: Diagnosis not present

## 2016-10-11 ENCOUNTER — Telehealth: Payer: Self-pay | Admitting: Cardiovascular Disease

## 2016-10-11 NOTE — Telephone Encounter (Signed)
Received records from [facility] for appointment on 11/08/16 with Dr Gwenlyn Found.  Records put with Dr Kennon Holter schedule for 11/08/16. lp

## 2016-11-07 DIAGNOSIS — K219 Gastro-esophageal reflux disease without esophagitis: Secondary | ICD-10-CM | POA: Diagnosis not present

## 2016-11-07 DIAGNOSIS — Z79899 Other long term (current) drug therapy: Secondary | ICD-10-CM | POA: Diagnosis not present

## 2016-11-08 ENCOUNTER — Encounter: Payer: Self-pay | Admitting: Cardiovascular Disease

## 2016-11-08 ENCOUNTER — Ambulatory Visit (INDEPENDENT_AMBULATORY_CARE_PROVIDER_SITE_OTHER): Payer: Medicare HMO | Admitting: Cardiovascular Disease

## 2016-11-08 VITALS — BP 112/70 | Ht 66.0 in | Wt 170.0 lb

## 2016-11-08 DIAGNOSIS — E7800 Pure hypercholesterolemia, unspecified: Secondary | ICD-10-CM | POA: Diagnosis not present

## 2016-11-08 DIAGNOSIS — I340 Nonrheumatic mitral (valve) insufficiency: Secondary | ICD-10-CM | POA: Diagnosis not present

## 2016-11-08 DIAGNOSIS — I471 Supraventricular tachycardia: Secondary | ICD-10-CM

## 2016-11-08 MED ORDER — METOPROLOL TARTRATE 25 MG PO TABS: 12.5000 mg | ORAL_TABLET | Freq: Two times a day (BID) | ORAL | 6 refills | Status: DC

## 2016-11-08 MED ORDER — METOPROLOL TARTRATE 25 MG PO TABS
12.5000 mg | ORAL_TABLET | Freq: Two times a day (BID) | ORAL | 6 refills | Status: DC
Start: 2016-11-08 — End: 2016-11-08

## 2016-11-08 NOTE — Progress Notes (Signed)
11/08/2016 Travis Kelly   [birth year]  [id number]  Primary Physician [name], MD Primary Cardiologist: Lorretta Harp MD [name], [place]  HPI:  Travis Kelly is a 83 y.o. male married, father of 36, grandfather of 40 grandchildren was accompanied by his daughter Travis Kelly. 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 He just stopped driving 3 years ago because of macular degeneration degeneration. He lives in [place] retirement community with his wife. His cardiologist was Dr. Dina Rich in [place] but he is transitioning to me because of proximity. He has a history of moderate mitral regurgitation, hyperlipidemia and left anterior fascicular block. He wishes no invasive procedures for his mitral valve. He was [facility] to [facility]/04/16 because of PSVT which broke. With Valsalva maneuver. He was placed on when necessary metoprolol. He has had several episodes of PSVT since which have similarly broke with vagal maneuvers. He is fairly active. He denies chest pain or shortness of breath.   Current Meds  Medication Sig  . Cholecalciferol (VITAMIN D) 2000 units CAPS Take 2,000 Units by mouth daily.  . finasteride (PROSCAR) 5 MG tablet Take 5 mg by mouth daily.  . Glucosamine HCl 1000 MG TABS Take 1 tablet by mouth daily.  . Lutein 6 MG CAPS Take 1 capsule by mouth.  . meloxicam (MOBIC) 15 MG tablet Take 15 mg by mouth daily as needed for pain.  . metoprolol tartrate (LOPRESSOR) 25 MG tablet Take 0.5 tablets (12.5 mg total) by mouth 2 (two) times daily.  . Misc Natural Products (TURMERIC CURCUMIN) CAPS Take 1 capsule by mouth daily.  . Multiple Vitamin (MULTIVITAMIN) tablet Take 1 tablet by mouth daily.  . Multiple Vitamins-Minerals (OCUVITE EYE HEALTH FORMULA) CAPS Take 1 tablet by mouth daily.  [place] omeprazole (PRILOSEC) 20 MG capsule Take 20 mg by mouth daily as needed for heartburn.  [place] RA KRILL OIL 500 MG CAPS Take 500 mg by mouth  daily.  . vitamin C (ASCORBIC ACID) 500 MG tablet Take 500 mg by mouth daily.  . [DISCONTINUED] metoprolol tartrate (LOPRESSOR) 25 MG tablet Take 1 tablet (25 mg total) by mouth as needed. Can take it once as needed for Rapid heart rate, palpitations  . [DISCONTINUED] metoprolol tartrate (LOPRESSOR) 25 MG tablet Take 0.5 tablets (12.5 mg total) by mouth 2 (two) times daily.     Allergies  Allergen Reactions  . Barbital     Social History   Social History  . Marital status: Married    Spouse name: N/A  . Number of children: N/A  . Years of education: N/A   Occupational History  . Not on file.   Social History Main Topics  . Smoking status: Never Smoker  . Smokeless tobacco: Never Used  . Alcohol use Not on file  . Drug use: Unknown  . Sexual activity: Not on file   Other Topics Concern  . Not on file   Social History Narrative  . No narrative on file     Review of Systems: General: negative for chills, fever, night sweats or weight changes.  Cardiovascular: negative for chest pain, dyspnea on exertion, edema, orthopnea, palpitations, paroxysmal nocturnal dyspnea or shortness of breath Dermatological: negative for rash Respiratory: negative for cough or wheezing Urologic: negative for hematuria Abdominal: negative for nausea, vomiting, diarrhea, bright red blood per rectum, melena, or hematemesis Neurologic: negative for visual changes, syncope, or dizziness All other systems reviewed and are otherwise negative  except as noted above.    Blood pressure 112/70, height 5\' 6"  (1.676 m), weight 170 lb (77.1 kg).  General appearance: alert and no distress Neck: no adenopathy, no carotid bruit, no JVD, supple, symmetrical, trachea midline and thyroid not enlarged, symmetric, no tenderness/mass/nodules Lungs: clear to auscultation bilaterally Heart: 3/6 ejection murmur at the apex consistent with MR Extremities: extremities normal, atraumatic, no cyanosis or edema  EKG  sinus rhythm at 85 with left anterior fascicular block. I personally reviewed this EKG  ASSESSMENT AND PLAN:   Hyperlipidemia History of hypokalemia not on statin therapy followed by his PCP  Moderate mitral regurgitation History of moderate MR which has been long-standing and confirmed by recent 2-D echo performed during hospitalization on 07/05/16. The LV size and function was normal.  SVT (supraventricular tachycardia) (HCC) History of a recent episode of PSVT which broke with "Valsalva maneuver while en route to the ER in EMS. His troponins were mildly elevated. He's had several episodes since which have similarly responded to vagal maneuvers. I'm going to begin him on low-dose metoprolol (Toprol 5 mg by mouth twice a day).      Lorretta Harp MD FACP,FACC,FAHA, [facility] 11/08/2016 10:05 AM

## 2016-11-08 NOTE — Assessment & Plan Note (Signed)
History of a recent episode of PSVT which broke with "Valsalva maneuver while en route to the ER in EMS. His troponins were mildly elevated. He's had several episodes since which have similarly responded to vagal maneuvers. I'm going to begin him on low-dose metoprolol (Toprol 5 mg by mouth twice a day).

## 2016-11-08 NOTE — Assessment & Plan Note (Signed)
History of moderate MR which has been long-standing and confirmed by recent 2-D echo performed during hospitalization on 07/05/16. The LV size and function was normal.

## 2016-11-08 NOTE — Assessment & Plan Note (Signed)
History of hypokalemia not on statin therapy followed by his PCP

## 2016-11-08 NOTE — Patient Instructions (Addendum)
Medication Instructions: Your physician recommends that you continue on your current medications as directed. Please refer to the Current Medication list given to you today.  Take Metoprolol 12.5 mg twice daily.  Follow-Up: Your physician wants you to follow-up in: 1 year with Dr. Gwenlyn Found. You will receive a reminder letter in the mail two months in advance. If you don't receive a letter, please call our office to schedule the follow-up appointment.  If you need a refill on your cardiac medications before your next appointment, please call your pharmacy.

## 2016-11-14 DIAGNOSIS — L308 Other specified dermatitis: Secondary | ICD-10-CM | POA: Diagnosis not present

## 2016-11-14 DIAGNOSIS — Z0000 Encounter for general adult medical examination without abnormal findings: Secondary | ICD-10-CM | POA: Diagnosis not present

## 2016-11-14 DIAGNOSIS — I471 Supraventricular tachycardia: Secondary | ICD-10-CM | POA: Diagnosis not present

## 2016-11-14 DIAGNOSIS — D7589 Other specified diseases of blood and blood-forming organs: Secondary | ICD-10-CM | POA: Diagnosis not present

## 2016-11-14 DIAGNOSIS — N401 Enlarged prostate with lower urinary tract symptoms: Secondary | ICD-10-CM | POA: Diagnosis not present

## 2016-11-14 DIAGNOSIS — K219 Gastro-esophageal reflux disease without esophagitis: Secondary | ICD-10-CM | POA: Diagnosis not present

## 2016-11-14 DIAGNOSIS — M1990 Unspecified osteoarthritis, unspecified site: Secondary | ICD-10-CM | POA: Diagnosis not present

## 2016-11-14 DIAGNOSIS — K148 Other diseases of tongue: Secondary | ICD-10-CM | POA: Diagnosis not present

## 2016-11-14 DIAGNOSIS — R5383 Other fatigue: Secondary | ICD-10-CM | POA: Diagnosis not present

## 2016-11-14 DIAGNOSIS — Z1389 Encounter for screening for other disorder: Secondary | ICD-10-CM | POA: Diagnosis not present

## 2016-11-15 DIAGNOSIS — Z1212 Encounter for screening for malignant neoplasm of rectum: Secondary | ICD-10-CM | POA: Diagnosis not present

## 2017-02-20 DIAGNOSIS — J189 Pneumonia, unspecified organism: Secondary | ICD-10-CM | POA: Diagnosis not present

## 2017-02-20 DIAGNOSIS — R0602 Shortness of breath: Secondary | ICD-10-CM | POA: Diagnosis not present

## 2017-02-20 DIAGNOSIS — Z6828 Body mass index (BMI) 28.0-28.9, adult: Secondary | ICD-10-CM | POA: Diagnosis not present

## 2017-02-20 DIAGNOSIS — R030 Elevated blood-pressure reading, without diagnosis of hypertension: Secondary | ICD-10-CM | POA: Diagnosis not present

## 2017-02-24 ENCOUNTER — Ambulatory Visit: Payer: Medicare HMO | Admitting: Cardiovascular Disease

## 2017-02-24 ENCOUNTER — Encounter: Payer: Self-pay | Admitting: Cardiovascular Disease

## 2017-02-24 DIAGNOSIS — R0609 Other forms of dyspnea: Secondary | ICD-10-CM

## 2017-02-24 MED ORDER — FUROSEMIDE 20 MG PO TABS: 20.0000 mg | ORAL_TABLET | Freq: Every day | ORAL | 6 refills | Status: DC

## 2017-02-24 NOTE — Assessment & Plan Note (Signed)
Travis Kelly  presents today with new onset dyspnea on exertion and orthopnea. He denies chest pain. He recently saw his PCP who began him on Levaquin empirically. They suggested a diuretic which she declined at that time. On exam his lungs are clear. His neck veins are up and he has a loud apical systolic murmur consistent with mitral regurgitation. He currently wishes no no invasive approach to this. I am going to begin him on Lasix 20 mg a day and will check a basic metabolic panel in 0-38 days. He will see Kerin Ransom back in 2 weeks and me back in one month.

## 2017-02-24 NOTE — Patient Instructions (Signed)
Medication Instructions: Your physician recommends that you continue on your current medications as directed. Please refer to the Current Medication list given to you today.  START Lasix 20 mg daily   Labwork: Your physician recommends that you return for lab work in: 7-10 days, BMET   Follow-Up: We request that you follow-up in: 2 weeks with Kerin Ransom, PA and in 1 month with Dr Andria Rhein will receive a reminder letter in the mail two months in advance. If you don't receive a letter, please call our office to schedule the follow-up appointment.  If you need a refill on your cardiac medications before your next appointment, please call your pharmacy.

## 2017-02-24 NOTE — Assessment & Plan Note (Signed)
History of moderate to possibly severe mitral regurgitation by 2-D echocardiogram performed 07/05/16.

## 2017-02-24 NOTE — Progress Notes (Signed)
02/24/2017 Travis Kelly   January 20, 1923  [id number]  Primary Physician [name], MD Primary Cardiologist: Lorretta Harp MD [name], [place]  HPI:  Travis Kelly is a 83 y.o. 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 He just stopped driving 3 years ago because of macular degeneration degeneration. He lives in [place] with his wife. His cardiologist was Dr. Dina Rich in [place] but he is transitioning to me because of proximity. He has a history of moderate mitral regurgitation, hyperlipidemia and left anterior fascicular block. He wishes no invasive procedures for his mitral valve. He was admitted to [facility] 07/04/16 because of PSVT which broke. With Valsalva maneuver. He was placed on when necessary metoprolol. He has had several episodes of PSVT since which have similarly broke with vagal maneuvers. He is fairly active.  Since I saw him back 5 months ago he's recently developed increasing dyspnea on exertion as well as orthopnea. He denies chest pain. He saw his PCP who apparently did a chest x-ray and began him empirically on Levaquin. They suggested an oral diuretic which she declined at that time.    Current Meds  Medication Sig  . Cholecalciferol (VITAMIN D) 2000 units CAPS Take 2,000 Units by mouth daily.  . finasteride (PROSCAR) 5 MG tablet Take 5 mg by mouth daily.  . Glucosamine HCl 1000 MG TABS Take 1 tablet by mouth daily.  . Lutein 6 MG CAPS Take 1 capsule by mouth.  . meloxicam (MOBIC) 15 MG tablet Take 15 mg by mouth daily as needed for pain.  . metoprolol tartrate (LOPRESSOR) 25 MG tablet Take 0.5 tablets (12.5 mg total) by mouth 2 (two) times daily.  . Misc Natural Products (TURMERIC CURCUMIN) CAPS Take 1 capsule by mouth daily.  . Multiple Vitamin (MULTIVITAMIN) tablet Take  1 tablet by mouth daily.  . Multiple Vitamins-Minerals (OCUVITE EYE HEALTH FORMULA) CAPS Take 1 tablet by mouth daily.  Marland Kitchen omeprazole (PRILOSEC) 20 MG capsule Take 20 mg by mouth daily as needed for heartburn.  Marland Kitchen RA KRILL OIL 500 MG CAPS Take 500 mg by mouth daily.  . vitamin C (ASCORBIC ACID) 500 MG tablet Take 500 mg by mouth daily.     Allergies  Allergen Reactions  . Barbital     Social History   Socioeconomic History  . Marital status: Married    Spouse name: Not on file  . Number of children: Not on file  . Years of education: Not on file  . Highest education level: Not on file  Social Needs  . Financial resource strain: Not on file  . Food insecurity - worry: Not on file  . Food insecurity - inability: Not on file  . Transportation needs - medical: Not on file  . Transportation needs - non-medical: Not on file  Occupational History  . Not on file  Tobacco Use  . Smoking status: Never Smoker  . Smokeless tobacco: Never Used  Substance and Sexual Activity  . Alcohol use: Not on file  . Drug use: Not on file  . Sexual activity: Not on file  Other Topics Concern  . Not on file  Social History Narrative  . Not on file     Review of Systems: General: negative for chills, fever, night sweats or weight changes.  Cardiovascular: negative for chest pain,  dyspnea on exertion, edema, orthopnea, palpitations, paroxysmal nocturnal dyspnea or shortness of breath Dermatological: negative for rash Respiratory: negative for cough or wheezing Urologic: negative for hematuria Abdominal: negative for nausea, vomiting, diarrhea, bright red blood per rectum, melena, or hematemesis Neurologic: negative for visual changes, syncope, or dizziness All other systems reviewed and are otherwise negative except as noted above.    Blood pressure 128/70, pulse 90, height 5\' 6"  (1.676 m), weight 171 lb 3.2 oz (77.7 kg), SpO2 91 %.  General appearance: alert and no distress Neck: no  adenopathy, no carotid bruit, supple, symmetrical, trachea midline, thyroid not enlarged, symmetric, no tenderness/mass/nodules and Moderately elevated jugular venous pressure Lungs: clear to auscultation bilaterally Heart: 3+ apical systolic murmur consistent with mitral regurgitation. Abdomen: soft, non-tender; bowel sounds normal; no masses,  no organomegaly Pulses: 2+ and symmetric Skin: Skin color, texture, turgor normal. No rashes or lesions Neurologic: Alert and oriented X 3, normal strength and tone. Normal symmetric reflexes. Normal coordination and gait  EKG sinus rhythm at 90 with PACs and PVCs and left anterior fascicular block. I personally reviewed this EKG.  ASSESSMENT AND PLAN:   Dyspnea on exertion Mr. Nordquist  presents today with new onset dyspnea on exertion and orthopnea. He denies chest pain. He recently saw his PCP who began him on Levaquin empirically. They suggested a diuretic which she declined at that time. On exam his lungs are clear. His neck veins are up and he has a loud apical systolic murmur consistent with mitral regurgitation. He currently wishes no no invasive approach to this. I am going to begin him on Lasix 20 mg a day and will check a basic metabolic panel in 1-96 days. He will see Kerin Ransom back in 2 weeks and me back in one month.  Moderate mitral regurgitation History of moderate to possibly severe mitral regurgitation by 2-D echocardiogram performed 07/05/16.  Hyperlipidemia History of hyperlipidemia not on statin therapy followed by his PCP      Lorretta Harp MD [facility], [facility] 02/24/2017 11:58 AM

## 2017-02-24 NOTE — Assessment & Plan Note (Signed)
History of hyperlipidemia not on statin therapy followed by his PCP 

## 2017-03-07 DIAGNOSIS — R0609 Other forms of dyspnea: Secondary | ICD-10-CM | POA: Diagnosis not present

## 2017-03-08 LAB — BASIC METABOLIC PANEL
BUN / CREAT RATIO: 24 (ref 10–24)
BUN: 30 mg/dL (ref 10–36)
CO2: 28 mmol/L (ref 20–29)
CREATININE: 1.27 mg/dL (ref 0.76–1.27)
Calcium: 9.1 mg/dL (ref 8.6–10.2)
Chloride: 99 mmol/L (ref 96–106)
GFR, EST AFRICAN AMERICAN: 56 mL/min/{1.73_m2} — AB (ref >59)
GFR, EST NON AFRICAN AMERICAN: 49 mL/min/{1.73_m2} — AB (ref >59)
GLUCOSE: 74 mg/dL (ref 65–99)
Potassium: 4.6 mmol/L (ref 3.5–5.2)
SODIUM: 142 mmol/L (ref 134–144)

## 2017-03-09 ENCOUNTER — Encounter: Payer: Self-pay | Admitting: Cardiology

## 2017-03-09 ENCOUNTER — Other Ambulatory Visit: Payer: Self-pay | Admitting: *Deleted

## 2017-03-09 ENCOUNTER — Ambulatory Visit: Payer: Medicare HMO | Admitting: Cardiology

## 2017-03-09 VITALS — BP 124/72 | HR 83 | Ht 66.0 in | Wt 166.0 lb

## 2017-03-09 DIAGNOSIS — I340 Nonrheumatic mitral (valve) insufficiency: Secondary | ICD-10-CM

## 2017-03-09 DIAGNOSIS — I444 Left anterior fascicular block: Secondary | ICD-10-CM

## 2017-03-09 DIAGNOSIS — Z8679 Personal history of other diseases of the circulatory system: Secondary | ICD-10-CM

## 2017-03-09 DIAGNOSIS — R0600 Dyspnea, unspecified: Secondary | ICD-10-CM

## 2017-03-09 DIAGNOSIS — R0609 Other forms of dyspnea: Secondary | ICD-10-CM

## 2017-03-09 DIAGNOSIS — M19019 Primary osteoarthritis, unspecified shoulder: Secondary | ICD-10-CM | POA: Insufficient documentation

## 2017-03-09 DIAGNOSIS — I447 Left bundle-branch block, unspecified: Secondary | ICD-10-CM | POA: Insufficient documentation

## 2017-03-09 NOTE — Assessment & Plan Note (Signed)
Moderate mitral regurgitation with normal LVF by echo march 2018

## 2017-03-09 NOTE — Progress Notes (Signed)
03/09/2017 Travis Kelly   11-Oct-1922  [id number]  Primary Physician [name], MD Primary Cardiologist: Dr Gwenlyn Found  HPI:  83 y.o. 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 He just stopped driving 3 years ago because of macular degeneration degeneration. He lives in [place] retirement community with his wife. His cardiologist was Dr. Marc Morgans but he has sinced transitioned to Dr Gwenlyn Found because of proximity. He has a history of moderate mitral regurgitation with normal LVF by echo March 2018. He wishes no invasive procedures for his mitral valve. [facility] which broke with Valsalva maneuver. He was placed on PRN metoprolol. He has had several episodes of PSVT since which have similarly broke with vagal maneuvers. He is fairly active.  Dr Gwenlyn Found had sen him in the office 02/24/17.  He had developed increasing dyspnea on exertion as well as orthopnea. He saw his PCP who apparently did a chest x-ray and began him empirically on Levaquin. They suggested an oral diuretic which she declined at that time, but Dr Gwenlyn Found added Lasix 20 mg daily when he saw him 02/24/17.  He is in the office today to see me for f/u. His edema has improved and his wgt is down 5 lbs. BUN/ SCr on 11/27 were up- 30/1.27.    Current Outpatient Medications  Medication Sig Dispense Refill  . Cholecalciferol (VITAMIN D) 2000 units CAPS Take 2,000 Units by mouth daily.    . finasteride (PROSCAR) 5 MG tablet Take 5 mg by mouth daily.    . furosemide (LASIX) 20 MG tablet Take 1 tablet (20 mg total) daily by mouth. 30 tablet 6  . Glucosamine HCl 1000 MG TABS Take 1 tablet by mouth daily.    . Lutein 6 MG CAPS Take 1 capsule by mouth.    . meloxicam (MOBIC) 15 MG tablet Take 15 mg by mouth daily as needed for pain.    . metoprolol tartrate (LOPRESSOR) 25 MG tablet Take 0.5 tablets (12.5 mg total) by  mouth 2 (two) times daily. 30 tablet 6  . Misc Natural Products (TURMERIC CURCUMIN) CAPS Take 1 capsule by mouth daily.    . Multiple Vitamin (MULTIVITAMIN) tablet Take 1 tablet by mouth daily.    . Multiple Vitamins-Minerals (OCUVITE EYE HEALTH FORMULA) CAPS Take 1 tablet by mouth daily.    Marland Kitchen omeprazole (PRILOSEC) 20 MG capsule Take 20 mg by mouth daily as needed for heartburn.    Marland Kitchen RA KRILL OIL 500 MG CAPS Take 500 mg by mouth daily.    . vitamin C (ASCORBIC ACID) 500 MG tablet Take 500 mg by mouth daily.     No current facility-administered medications for this visit.     Allergies  Allergen Reactions  . Barbital     Past Medical History:  Diagnosis Date  . LAFB (left anterior fascicular block)   . Macular degeneration   . Mitral valve disorder    Moderate Travis  . Nephrolithiasis 2013  . PSVT (paroxysmal supraventricular tachycardia) ([place]) 06/2016    Social History   Socioeconomic History  . Marital status: Married    Spouse name: Not on file  . Number of children: Not on file  . Years of education: Not on file  . Highest education level: Not on file  Social Needs  . Financial resource strain: Not on file  . Food insecurity - worry: Not on file  .  Food insecurity - inability: Not on file  . Transportation needs - medical: Not on file  . Transportation needs - non-medical: Not on file  Occupational History  . Not on file  Tobacco Use  . Smoking status: Never Smoker  . Smokeless tobacco: Never Used  Substance and Sexual Activity  . Alcohol use: Not on file  . Drug use: Not on file  . Sexual activity: Not on file  Other Topics Concern  . Not on file  Social History Narrative  . Not on file     Family History  Problem Relation Age of Onset  . Cancer Mother   . Pneumonia Father      Review of Systems: General: negative for chills, fever, night sweats or weight changes.  Cardiovascular: negative for chest pain, dyspnea on exertion, edema, orthopnea,  palpitations, paroxysmal nocturnal dyspnea or shortness of breath Dermatological: negative for rash Respiratory: negative for cough or wheezing Urologic: negative for hematuria Abdominal: negative for nausea, vomiting, diarrhea, bright red blood per rectum, melena, or hematemesis Neurologic: negative for visual changes, syncope, or dizziness All other systems reviewed and are otherwise negative except as noted above.    Blood pressure 124/72, pulse 83, height 5\' 6"  (1.676 m), weight 166 lb (75.3 kg), SpO2 96 %.  General appearance: alert, cooperative, no distress and looks younger than 65 Lungs: clear to auscultation bilaterally Heart: regular rate and rhythm and 8-1/2 systolic murmur of Travis Extremities: trace edema Skin: Skin color, texture, turgor normal. No rashes or lesions Neurologic: Grossly normal   ASSESSMENT AND PLAN:   Moderate mitral regurgitation Moderate mitral regurgitation with normal LVF by echo march 2018  LAFB (left anterior fascicular block) Chronic  History of PSVT (paroxysmal supraventricular tachycardia) Stable on low dose beta blocker  Dyspnea on exertion Dyspnea on exertion-lasix added 02/24/17- wgt down 5 lbs   PLAN  I suggested Travis Kelly decrease his Lasix to 20 mg MWF. If his wgt goes up 3-5 lbs in a week he should go back to Lasix 20 mg daily till it comes down. I think his dry wgt is 165-168 lbs. He has a f/u with dr Gwenlyn Found in Dec and will keep that.   Kerin Ransom PA-C 03/09/2017 4:12 PM

## 2017-03-09 NOTE — Patient Instructions (Signed)
Medication Instructions: Take Mobic as needed.  Decrease Lasix to every Monday, Wednesday, and Friday (3 times weekly). If your weight goes up 3-4 lbs in 1 week, increase Lasix to daily until weight comes back down--then back to 3 times weekly.  Follow-Up: Keep follow-up appointment with Dr. Gwenlyn Found listed below.  If you need a refill on your cardiac medications before your next appointment, please call your pharmacy.

## 2017-03-09 NOTE — Assessment & Plan Note (Signed)
Stable on low dose beta blocker

## 2017-03-09 NOTE — Assessment & Plan Note (Signed)
Chronic. 

## 2017-03-09 NOTE — Assessment & Plan Note (Signed)
Dyspnea on exertion-lasix added 02/24/17- wgt down 5 lbs

## 2017-03-14 DIAGNOSIS — H0100B Unspecified blepharitis left eye, upper and lower eyelids: Secondary | ICD-10-CM | POA: Diagnosis not present

## 2017-03-14 DIAGNOSIS — H353122 Nonexudative age-related macular degeneration, left eye, intermediate dry stage: Secondary | ICD-10-CM | POA: Diagnosis not present

## 2017-03-14 DIAGNOSIS — Z961 Presence of intraocular lens: Secondary | ICD-10-CM | POA: Diagnosis not present

## 2017-03-14 DIAGNOSIS — H353210 Exudative age-related macular degeneration, right eye, stage unspecified: Secondary | ICD-10-CM | POA: Diagnosis not present

## 2017-03-14 DIAGNOSIS — H0100A Unspecified blepharitis right eye, upper and lower eyelids: Secondary | ICD-10-CM | POA: Diagnosis not present

## 2017-03-14 DIAGNOSIS — H43812 Vitreous degeneration, left eye: Secondary | ICD-10-CM | POA: Diagnosis not present

## 2017-03-28 ENCOUNTER — Encounter: Payer: Self-pay | Admitting: Cardiovascular Disease

## 2017-03-28 ENCOUNTER — Ambulatory Visit: Payer: Medicare HMO | Admitting: Cardiovascular Disease

## 2017-03-28 DIAGNOSIS — R0609 Other forms of dyspnea: Secondary | ICD-10-CM

## 2017-03-28 NOTE — Patient Instructions (Signed)
Medication Instructions: Your physician recommends that you continue on your current medications as directed. Please refer to the Current Medication list given to you today.   Follow-Up: We request that you follow-up in: 3 months with Kerin Ransom, PA and in 6 months with Dr Andria Rhein will receive a reminder letter in the mail two months in advance. If you don't receive a letter, please call our office to schedule the follow-up appointment.  If you need a refill on your cardiac medications before your next appointment, please call your pharmacy.

## 2017-03-28 NOTE — Assessment & Plan Note (Signed)
History of recent onset dyspnea on exertion with known mitral regurgitation. I began him on low-dose furosemide on 02/24/17 which resulted in diuresis and marked improvement in his symptoms. He saw Kerin Ransom back on 03/09/17 after losing 5 pounds and his diuretics were backed every other day. His BUN/creatinine were mildly elevated. He now is aware of salt avoidance and weighs himself on a daily basis.

## 2017-03-28 NOTE — Progress Notes (Signed)
03/28/2017 Travis Kelly   11-Apr-1923  [id number]  Primary Physician [name], MD Primary Cardiologist: Lorretta Harp MD Travis Kelly, [place]  HPI:  Travis Kelly is a 83 y.o.  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 I last saw him in the office 02/24/17. He just stopped driving 3 years ago because of macular degeneration degeneration. He lives in [place] retirement community with his wife. His cardiologist was Dr. Marc Morgans but he is transitioning to me because of proximity. He has a history of moderate mitral regurgitation, hyperlipidemia and left anterior fascicular block. He wishes no invasive procedures for his mitral valve. [facility] which broke. With Valsalva maneuver. He was placed on when necessary metoprolol. He has had several episodes of PSVT since which have similarly broke with vagal maneuvers. He is fairly active.  When I saw him in the office on 02/24/17 he was complaining of increasing dyspnea on exertionas well as orthopnea. He denied chest pain. He saw his PCP who apparently did a chest x-ray and began him empirically on Levaquin. They suggested an oral diuretic which he declined at that time. I ultimately started him on Lasix 20 mg a day which resulted in diuresis of about 5 pounds a marked improvement in his symptoms. BUN/creatinine rose slightly. He saw Kerin Ransom back on 03/09/17 at which time his diuretics were down titrated every other day. He remains asymptomatic. He is aware of salt restriction.     Current Meds  Medication Sig  . Cholecalciferol (VITAMIN D) 2000 units CAPS Take 2,000 Units by mouth daily.  . finasteride (PROSCAR) 5 MG tablet Take 5 mg by mouth daily.  . furosemide (LASIX) 20 MG tablet Take 1 tablet (20 mg total) daily by  mouth.  . Glucosamine HCl 1000 MG TABS Take 1 tablet by mouth daily.  . Lutein 6 MG CAPS Take 1 capsule by mouth.  . meloxicam (MOBIC) 15 MG tablet Take 15 mg by mouth daily as needed for pain.  . metoprolol tartrate (LOPRESSOR) 25 MG tablet Take 0.5 tablets (12.5 mg total) by mouth 2 (two) times daily.  . Misc Natural Products (TURMERIC CURCUMIN) CAPS Take 1 capsule by mouth daily.  . Multiple Vitamin (MULTIVITAMIN) tablet Take 1 tablet by mouth daily.  . Multiple Vitamins-Minerals (OCUVITE EYE HEALTH FORMULA) CAPS Take 1 tablet by mouth daily.  Marland Kitchen omeprazole (PRILOSEC) 20 MG capsule Take 20 mg by mouth daily as needed for heartburn.  Marland Kitchen RA KRILL OIL 500 MG CAPS Take 500 mg by mouth daily.  . vitamin C (ASCORBIC ACID) 500 MG tablet Take 500 mg by mouth daily.     Allergies  Allergen Reactions  . Barbital     Social History   Socioeconomic History  . Marital status: Married    Spouse name: Not on file  . Number of children: Not on file  . Years of education: Not on file  . Highest education level: Not on file  Social Needs  . Financial resource strain: Not on file  . Food insecurity - worry: Not on file  . Food insecurity - inability: Not on file  . Transportation needs - medical: Not on file  . Transportation needs - non-medical: Not on file  Occupational History  . Not on file  Tobacco Use  . Smoking status:  Never Smoker  . Smokeless tobacco: Never Used  Substance and Sexual Activity  . Alcohol use: Not on file  . Drug use: Not on file  . Sexual activity: Not on file  Other Topics Concern  . Not on file  Social History Narrative  . Not on file     Review of Systems: General: negative for chills, fever, night sweats or weight changes.  Cardiovascular: negative for chest pain, dyspnea on exertion, edema, orthopnea, palpitations, paroxysmal nocturnal dyspnea or shortness of breath Dermatological: negative for rash Respiratory: negative for cough or wheezing Urologic:  negative for hematuria Abdominal: negative for nausea, vomiting, diarrhea, bright red blood per rectum, melena, or hematemesis Neurologic: negative for visual changes, syncope, or dizziness All other systems reviewed and are otherwise negative except as noted above.    Blood pressure 112/64, pulse 66, height 5\' 6"  (1.676 m), weight 168 lb (76.2 kg).  General appearance: alert and no distress Neck: no adenopathy, no carotid bruit, no JVD, supple, symmetrical, trachea midline and thyroid not enlarged, symmetric, no tenderness/mass/nodules Lungs: clear to auscultation bilaterally Heart: Apical systolic murmur consistent with mitral regurgitation Extremities: Trace bilateral lower extremity edema Pulses: 2+ and symmetric Skin: Skin color, texture, turgor normal. No rashes or lesions Neurologic: Alert and oriented X 3, normal strength and tone. Normal symmetric reflexes. Normal coordination and gait  EKG not performed today  ASSESSMENT AND PLAN:   Dyspnea on exertion History of recent onset dyspnea on exertion with known mitral regurgitation. I began him on low-dose furosemide on 02/24/17 which resulted in diuresis and marked improvement in his symptoms. He saw Kerin Ransom back on 03/09/17 after losing 5 pounds and his diuretics were backed every other day. His BUN/creatinine were mildly elevated. He now is aware of salt avoidance and weighs himself on a daily basis.      Lorretta Harp MD FACP,FACC,FAHA, [facility] 03/28/2017 3:45 PM

## 2017-05-30 DIAGNOSIS — R198 Other specified symptoms and signs involving the digestive system and abdomen: Secondary | ICD-10-CM | POA: Diagnosis not present

## 2017-05-30 DIAGNOSIS — H903 Sensorineural hearing loss, bilateral: Secondary | ICD-10-CM | POA: Diagnosis not present

## 2017-07-10 ENCOUNTER — Ambulatory Visit: Payer: Medicare HMO | Admitting: Cardiology

## 2017-07-10 ENCOUNTER — Encounter: Payer: Self-pay | Admitting: Cardiology

## 2017-07-10 VITALS — BP 96/60 | HR 78 | Ht 66.0 in | Wt 167.0 lb

## 2017-07-10 DIAGNOSIS — I5032 Chronic diastolic (congestive) heart failure: Secondary | ICD-10-CM

## 2017-07-10 DIAGNOSIS — N183 Chronic kidney disease, stage 3 unspecified: Secondary | ICD-10-CM

## 2017-07-10 DIAGNOSIS — I444 Left anterior fascicular block: Secondary | ICD-10-CM

## 2017-07-10 DIAGNOSIS — Z8679 Personal history of other diseases of the circulatory system: Secondary | ICD-10-CM

## 2017-07-10 DIAGNOSIS — I340 Nonrheumatic mitral (valve) insufficiency: Secondary | ICD-10-CM

## 2017-07-10 MED ORDER — FUROSEMIDE 20 MG PO TABS: 20.0000 mg | ORAL_TABLET | Freq: Every day | ORAL | 1 refills | Status: DC

## 2017-07-10 NOTE — Assessment & Plan Note (Signed)
Stable. His goal weight looks to be between 170-165 lbs. I suggested he take an extra Lasix if he feels he needs it but not more that two doses without contacting us.

## 2017-07-10 NOTE — Progress Notes (Signed)
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 He just stopped driving 3 years ago because of macular degeneration degeneration. He lives in [place] with his wife. His cardiologist was Dr. Marc Morgans but he has sinced transitioned to Dr Gwenlyn Found because of proximity. He has a history of moderate mitral regurgitation with normal LVF by echo March 2018. He wishes no invasive procedures for his mitral valve. [facility] of PS[facility] which broke with Valsalva maneuver. He was placed on low dose metoprololl. He has had occasional episodes of PSVT since which have similarly broke with vagal maneuvers. He is fairly active. He had some fluid overload in Nov and Dec 2018. He is on Lasix 20 mg three times a week and his weight has been stable, 166-171 lbs.   Dr Gwenlyn Found saw him last in Dec and he was stable. He is seeing me now in follow up. He tells me he has had a day or two recently when he felt like he had increased fluid with some increased LE edema and some increased DOE but today he thinks he is doing well. He tells me he had a talk with the chef at [facility] and he is now sure he is on a low sodium diet.   Current Outpatient Medications  Medication Sig Dispense Refill  . Cholecalciferol (VITAMIN D) 2000 units CAPS Take 2,000 Units by mouth daily.    . finasteride (PROSCAR) 5 MG tablet Take 5 mg by mouth daily.    . furosemide (LASIX) 20 MG tablet Take 1 tablet (20 mg total) by mouth daily. 90 tablet 1  . Glucosamine HCl 1000 MG TABS Take 1 tablet by mouth daily.    . Lutein 6 MG CAPS Take 1 capsule by mouth.    . meloxicam (MOBIC) 15 MG tablet Take 15 mg by mouth daily as needed for pain.    . metoprolol tartrate  (LOPRESSOR) 25 MG tablet Take 0.5 tablets (12.5 mg total) by mouth 2 (two) times daily. 30 tablet 6  . Misc Natural Products (TURMERIC CURCUMIN) CAPS Take 1 capsule by mouth daily.    . Multiple Vitamin (MULTIVITAMIN) tablet Take 1 tablet by mouth daily.    . Multiple Vitamins-Minerals (OCUVITE EYE HEALTH FORMULA) CAPS Take 1 tablet by mouth daily.    Marland Kitchen omeprazole (PRILOSEC) 20 MG capsule Take 20 mg by mouth daily as needed for heartburn.    Marland Kitchen RA KRILL OIL 500 MG CAPS Take 500 mg by mouth daily.    . vitamin C (ASCORBIC ACID) 500 MG tablet Take 500 mg by mouth daily.     No current facility-administered medications for this visit.     Allergies  Allergen Reactions  . Barbital     Past Medical History:  Diagnosis Date  . LAFB (left anterior fascicular block)   . Macular degeneration   . Mitral valve disorder    Moderate MR  . Nephrolithiasis 2013  . PSVT (paroxysmal supraventricular tachycardia) ([place]) 06/2016    Social History   Socioeconomic History  . Marital status: Married    Spouse name: Not on file  . Number of children: Not on file  . Years of education: Not on file  . Highest education level: Not on file  Occupational  History  . Not on file  Social Needs  . Financial resource strain: Not on file  . Food insecurity:    Worry: Not on file    Inability: Not on file  . Transportation needs:    Medical: Not on file    Non-medical: Not on file  Tobacco Use  . Smoking status: Never Smoker  . Smokeless tobacco: Never Used  Substance and Sexual Activity  . Alcohol use: Not on file  . Drug use: Not on file  . Sexual activity: Not on file  Lifestyle  . Physical activity:    Days per week: Not on file    Minutes per session: Not on file  . Stress: Not on file  Relationships  . Social connections:    Talks on phone: Not on file    Gets together: Not on file    Attends religious service: Not on file    Active member of club or organization: Not on file     Attends meetings of clubs or organizations: Not on file    Relationship status: Not on file  . Intimate partner violence:    Fear of current or ex partner: Not on file    Emotionally abused: Not on file    Physically abused: Not on file    Forced sexual activity: Not on file  Other Topics Concern  . Not on file  Social History Narrative  . Not on file     Family History  Problem Relation Age of Onset  . Cancer Mother   . Pneumonia Father      Review of Systems: General: negative for chills, fever, night sweats or weight changes.  Cardiovascular: negative for chest pain, dyspnea on exertion, orthopnea, palpitations, paroxysmal nocturnal dyspnea or shortness of breath Dermatological: negative for rash Respiratory: negative for cough or wheezing Urologic: negative for hematuria Abdominal: negative for nausea, vomiting, diarrhea, bright red blood per rectum, melena, or hematemesis Neurologic: negative for visual changes, syncope, or dizziness All other systems reviewed and are otherwise negative except as noted above.    Blood pressure 96/60, pulse 78, height 5\' 6"  (1.676 m), weight 167 lb (75.8 kg).  General appearance: alert, cooperative, no distress and looks younger that his stated age Neck: no JVD Lungs: few crackles Lt base Heart: regular rate and rhythm and 2/6 MR murmur, extra systole noted Extremities: tarce -1+ LLE edema  Skin: Skin color, texture, turgor normal. No rashes or lesions Neurologic: Grossly normal   ASSESSMENT AND PLAN:   Moderate mitral regurgitation Moderate mitral regurgitation with normal LVF by echo march 2018  Chronic diastolic heart failure (HCC) Stable. His goal weight looks to be between 170-165 lbs. I suggested he take an extra Lasix if he feels he needs it but not more that two doses without contacting us.  History of PSVT  He has occasional brief PSVT by history. He is able to stop these with valsalva.  I told him if that didn't work  he could take an extra Metoprolol 12.5 mg  Chronic renal insufficiency, stage 3 (moderate) ([place]) Check BMP today   PLAN  As above- check BMP today. I'll have Dr Gwenlyn Found see him in 6 months unless he feels like he needs to come in sooner.   Kerin Ransom PA-C 07/10/2017 4:34 PM

## 2017-07-10 NOTE — Assessment & Plan Note (Signed)
He has occasional brief PSVT by history. He is able to stop these with valsalva.  I told him if that didn't work he could take an extra Metoprolol 12.5 mg

## 2017-07-10 NOTE — Patient Instructions (Signed)
Medication Instructions: Your physician recommends that you continue on your current medications as directed. Please refer to the Current Medication list given to you today.  Labwork: Your physician recommends that you return for lab work today: BMET   Follow-Up: Your physician wants you to follow-up in: 6 months (October) with Dr. Gwenlyn Found. You will receive a reminder letter in the mail two months in advance. If you don't receive a letter, please call our office to schedule the follow-up appointment.  If you need a refill on your cardiac medications before your next appointment, please call your pharmacy.

## 2017-07-10 NOTE — Assessment & Plan Note (Signed)
Moderate mitral regurgitation with normal LVF by echo march 2018

## 2017-07-10 NOTE — Assessment & Plan Note (Signed)
Check BMP today 

## 2017-07-11 LAB — BASIC METABOLIC PANEL
BUN/Creatinine Ratio: 27 — ABNORMAL HIGH (ref 10–24)
BUN: 32 mg/dL (ref 10–36)
CO2: 26 mmol/L (ref 20–29)
Calcium: 9.2 mg/dL (ref 8.6–10.2)
Chloride: 104 mmol/L (ref 96–106)
Creatinine, Ser: 1.18 mg/dL (ref 0.76–1.27)
GFR calc Af Amer: 61 mL/min/{1.73_m2} (ref >59)
GFR calc non Af Amer: 53 mL/min/{1.73_m2} — ABNORMAL LOW (ref >59)
Glucose: 92 mg/dL (ref 65–99)
Potassium: 4.6 mmol/L (ref 3.5–5.2)
Sodium: 143 mmol/L (ref 134–144)

## 2017-09-06 ENCOUNTER — Telehealth: Payer: Self-pay | Admitting: Cardiology

## 2017-09-06 MED ORDER — METOPROLOL TARTRATE 25 MG PO TABS: 12.5000 mg | ORAL_TABLET | Freq: Two times a day (BID) | ORAL | 6 refills | Status: DC

## 2017-09-06 NOTE — Telephone Encounter (Signed)
New Message   Pt c/o medication issue:  1. Name of Medication: metoprolol tartrate (LOPRESSOR) 25 MG tablet  2. How are you currently taking this medication (dosage and times per day)? Take 0.5 tablets (12.5 mg total) by mouth 2 (two) times daily  3. Are you having a reaction (difficulty breathing--STAT)? no  4. What is your medication issue? Pt states he had to up his dosage and wants to speak with a nurse. Please call

## 2017-09-06 NOTE — Telephone Encounter (Signed)
Patient requesting extra metoprolol pills since he takes an extra 12.5 mg as needed per last office note. Refill with new directions sent to pharmacy.

## 2017-09-15 DIAGNOSIS — M25512 Pain in left shoulder: Secondary | ICD-10-CM | POA: Diagnosis not present

## 2017-09-15 DIAGNOSIS — Z6827 Body mass index (BMI) 27.0-27.9, adult: Secondary | ICD-10-CM | POA: Diagnosis not present

## 2017-09-15 DIAGNOSIS — S46012A Strain of muscle(s) and tendon(s) of the rotator cuff of left shoulder, initial encounter: Secondary | ICD-10-CM | POA: Diagnosis not present

## 2017-09-15 DIAGNOSIS — M25412 Effusion, left shoulder: Secondary | ICD-10-CM | POA: Diagnosis not present

## 2017-09-26 ENCOUNTER — Ambulatory Visit: Payer: Medicare HMO | Admitting: Cardiovascular Disease

## 2017-09-27 ENCOUNTER — Ambulatory Visit: Payer: Medicare HMO | Admitting: Cardiovascular Disease

## 2017-11-09 DIAGNOSIS — M25561 Pain in right knee: Secondary | ICD-10-CM | POA: Diagnosis not present

## 2017-11-09 DIAGNOSIS — M13861 Other specified arthritis, right knee: Secondary | ICD-10-CM | POA: Diagnosis not present

## 2017-11-13 DIAGNOSIS — R82998 Other abnormal findings in urine: Secondary | ICD-10-CM | POA: Diagnosis not present

## 2017-11-13 DIAGNOSIS — Z79899 Other long term (current) drug therapy: Secondary | ICD-10-CM | POA: Diagnosis not present

## 2017-11-20 DIAGNOSIS — M1990 Unspecified osteoarthritis, unspecified site: Secondary | ICD-10-CM | POA: Diagnosis not present

## 2017-11-20 DIAGNOSIS — R030 Elevated blood-pressure reading, without diagnosis of hypertension: Secondary | ICD-10-CM | POA: Diagnosis not present

## 2017-11-20 DIAGNOSIS — M25412 Effusion, left shoulder: Secondary | ICD-10-CM | POA: Diagnosis not present

## 2017-11-20 DIAGNOSIS — I471 Supraventricular tachycardia: Secondary | ICD-10-CM | POA: Diagnosis not present

## 2017-11-20 DIAGNOSIS — R002 Palpitations: Secondary | ICD-10-CM | POA: Diagnosis not present

## 2017-11-20 DIAGNOSIS — L308 Other specified dermatitis: Secondary | ICD-10-CM | POA: Diagnosis not present

## 2017-11-20 DIAGNOSIS — Z0000 Encounter for general adult medical examination without abnormal findings: Secondary | ICD-10-CM | POA: Diagnosis not present

## 2017-11-20 DIAGNOSIS — R0789 Other chest pain: Secondary | ICD-10-CM | POA: Diagnosis not present

## 2017-11-20 DIAGNOSIS — N401 Enlarged prostate with lower urinary tract symptoms: Secondary | ICD-10-CM | POA: Diagnosis not present

## 2017-11-28 ENCOUNTER — Other Ambulatory Visit: Payer: Self-pay | Admitting: Cardiology

## 2017-11-29 NOTE — Telephone Encounter (Signed)
Rx sent to pharmacy   

## 2017-12-12 DIAGNOSIS — H353122 Nonexudative age-related macular degeneration, left eye, intermediate dry stage: Secondary | ICD-10-CM | POA: Diagnosis not present

## 2017-12-12 DIAGNOSIS — Z961 Presence of intraocular lens: Secondary | ICD-10-CM | POA: Diagnosis not present

## 2017-12-12 DIAGNOSIS — H353210 Exudative age-related macular degeneration, right eye, stage unspecified: Secondary | ICD-10-CM | POA: Diagnosis not present

## 2017-12-12 DIAGNOSIS — H0100B Unspecified blepharitis left eye, upper and lower eyelids: Secondary | ICD-10-CM | POA: Diagnosis not present

## 2017-12-12 DIAGNOSIS — H43812 Vitreous degeneration, left eye: Secondary | ICD-10-CM | POA: Diagnosis not present

## 2017-12-12 DIAGNOSIS — H0100A Unspecified blepharitis right eye, upper and lower eyelids: Secondary | ICD-10-CM | POA: Diagnosis not present

## 2018-02-06 ENCOUNTER — Encounter: Payer: Self-pay | Admitting: Cardiovascular Disease

## 2018-02-06 ENCOUNTER — Ambulatory Visit: Payer: Medicare HMO | Admitting: Cardiovascular Disease

## 2018-02-06 DIAGNOSIS — Z8679 Personal history of other diseases of the circulatory system: Secondary | ICD-10-CM | POA: Diagnosis not present

## 2018-02-06 DIAGNOSIS — I5032 Chronic diastolic (congestive) heart failure: Secondary | ICD-10-CM | POA: Diagnosis not present

## 2018-02-06 DIAGNOSIS — E7800 Pure hypercholesterolemia, unspecified: Secondary | ICD-10-CM

## 2018-02-06 NOTE — Assessment & Plan Note (Signed)
History of moderate mitral regurgitation by 2D echo 07/05/2016 with normal LV function.

## 2018-02-06 NOTE — Patient Instructions (Signed)
Medication Instructions:  Your physician recommends that you continue on your current medications as directed. Please refer to the Current Medication list given to you today.  If you need a refill on your cardiac medications before your next appointment, please call your pharmacy.   Lab work: none If you have labs (blood work) drawn today and your tests are completely normal, you will receive your results only by: Marland Kitchen MyChart Message (if you have MyChart) OR . A paper copy in the mail If you have any lab test that is abnormal or we need to change your treatment, we will call you to review the results.  Testing/Procedures: none  Follow-Up: At [facility], you and your health needs are our priority.  As part of our continuing mission to provide you with exceptional heart care, we have created designated Provider Care Teams.  These Care Teams include your primary Cardiologist (physician) and Advanced Practice Providers (APPs -  Physician Assistants and Nurse Practitioners) who all work together to provide you with the care you need, when you need it. We request that you follow-up in: 6 months with an extender and in 12 months with Travis Kelly Please call our office to schedule your appointment 2 months before.     Any Other Special Instructions Will Be Listed Below (If Applicable).

## 2018-02-06 NOTE — Assessment & Plan Note (Signed)
History of hyperlipidemia not on statin therapy. 

## 2018-02-06 NOTE — Assessment & Plan Note (Signed)
History of chronic diastolic heart failure on furosemide. 

## 2018-02-06 NOTE — Assessment & Plan Note (Signed)
History of episodic PSVT on beta-blockade.  These episodes occur several times a year and are usually self-limited.  He does get some throat discomfort and right upper extremity discomfort during these episodes which raises the question of whether these are ischemically mediated or the rapid heart rate exacerbates underlying CAD although at his age I am not sure that we should pursue functional testing.

## 2018-02-06 NOTE — Progress Notes (Signed)
02/06/2018 Travis Kelly   04-16-1922  [id number]  Primary Physician Travis Hatchet, MD Primary Cardiologist: Travis Harp MD Travis Kelly, [place]  HPI:  Travis Kelly is a 84 y.o.  married, father of 43, grandfather of 78 grandchildren was accompanied by his daughterNora . 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 He just stopped driving 3 years ago because of macular degeneration degeneration. He lives in [place] with his wife. His cardiologist was Dr. Marc Kelly but he is transitioning to me because of proximity. He has a history of moderate mitral regurgitation, hyperlipidemia and left anterior fascicular block. He wishes no invasive procedures for his mitral valve. [facility] which broke. With Valsalva maneuver. He was placed on when necessary metoprolol. He has had several episodes of PSVT since which have similarly broke with vagal maneuvers. He is fairly active.  Since I saw him a year ago.  He is done relatively well.  He has seen Travis Kelly 6 months ago.  He gets occasional episodes of PSVT which are self-limited.  He also has chronic diastolic heart failure on furosemide.  He occasionally gets throat pain with right upper extremity discomfort as well usually during episodes of his tachypalpitations which raises the question of whether or not these are ischemic equivalents.   Current Meds  Medication Sig  . Cholecalciferol (VITAMIN D) 2000 units CAPS Take 2,000 Units by mouth daily.  . finasteride (PROSCAR) 5 MG tablet Take 5 mg by mouth daily.  . furosemide (LASIX) 20 MG tablet TAKE 1 TABLET EVERY DAY (Patient taking differently: Take 20 mg by mouth 3 (three) times a week. )  . Glucosamine HCl 1000 MG TABS Take 1 tablet by mouth daily.  . Lutein 6 MG CAPS Take 1 capsule by mouth.  . meloxicam (MOBIC) 15 MG tablet Take 15 mg by mouth daily as needed for  pain.  . metoprolol tartrate (LOPRESSOR) 25 MG tablet Take 0.5 tablets (12.5 mg total) by mouth 2 (two) times daily. May take an extra 12.5 mg by mouth as needed for fast heart rate  . Misc Natural Products (TURMERIC CURCUMIN) CAPS Take 1 capsule by mouth daily.  . Multiple Vitamin (MULTIVITAMIN) tablet Take 1 tablet by mouth daily.  . Multiple Vitamins-Minerals (OCUVITE EYE HEALTH FORMULA) CAPS Take 1 tablet by mouth daily.  Marland Kitchen RA KRILL OIL 500 MG CAPS Take 500 mg by mouth daily.  . vitamin C (ASCORBIC ACID) 500 MG tablet Take 500 mg by mouth daily.  . [DISCONTINUED] omeprazole (PRILOSEC) 20 MG capsule Take 20 mg by mouth daily as needed for heartburn.     Allergies  Allergen Reactions  . Barbital     Social History   Socioeconomic History  . Marital status: Married    Spouse name: Not on file  . Number of children: Not on file  . Years of education: Not on file  . Highest education level: Not on file  Occupational History  . Not on file  Social Needs  . Financial resource strain: Not on file  . Food insecurity:    Worry: Not on file    Inability: Not on file  . Transportation needs:    Medical: Not on file    Non-medical: Not on file  Tobacco Use  . Smoking status: Never Smoker  . Smokeless tobacco: Never Used  Substance and Sexual Activity  . Alcohol use: Not on  file  . Drug use: Not on file  . Sexual activity: Not on file  Lifestyle  . Physical activity:    Days per week: Not on file    Minutes per session: Not on file  . Stress: Not on file  Relationships  . Social connections:    Talks on phone: Not on file    Gets together: Not on file    Attends religious service: Not on file    Active member of club or organization: Not on file    Attends meetings of clubs or organizations: Not on file    Relationship status: Not on file  . Intimate partner violence:    Fear of current or ex partner: Not on file    Emotionally abused: Not on file    Physically abused:  Not on file    Forced sexual activity: Not on file  Other Topics Concern  . Not on file  Social History Narrative  . Not on file     Review of Systems: General: negative for chills, fever, night sweats or weight changes.  Cardiovascular: negative for chest pain, dyspnea on exertion, edema, orthopnea, palpitations, paroxysmal nocturnal dyspnea or shortness of breath Dermatological: negative for rash Respiratory: negative for cough or wheezing Urologic: negative for hematuria Abdominal: negative for nausea, vomiting, diarrhea, bright red blood per rectum, melena, or hematemesis Neurologic: negative for visual changes, syncope, or dizziness All other systems reviewed and are otherwise negative except as noted above.    Blood pressure 110/66, pulse 67, height 5\' 6"  (1.676 m), weight 168 lb (76.2 kg).  General appearance: alert and no distress Neck: no adenopathy, no carotid bruit, no JVD, supple, symmetrical, trachea midline and thyroid not enlarged, symmetric, no tenderness/mass/nodules Lungs: clear to auscultation bilaterally Heart: regular rate and rhythm, S1, S2 normal, no murmur, click, rub or gallop Extremities: extremities normal, atraumatic, no cyanosis or edema Pulses: 2+ and symmetric Skin: Skin color, texture, turgor normal. No rashes or lesions Neurologic: Alert and oriented X 3, normal strength and tone. Normal symmetric reflexes. Normal coordination and gait  EKG sinus rhythm at 67 with frequent PVCs.  Personally reviewed this EKG.  ASSESSMENT AND PLAN:   History of PSVT  History of episodic PSVT on beta-blockade.  These episodes occur several times a year and are usually self-limited.  He does get some throat discomfort and right upper extremity discomfort during these episodes which raises the question of whether these are ischemically mediated or the rapid heart rate exacerbates underlying CAD although at his age I am not sure that we should pursue functional  testing.  Moderate mitral regurgitation History of moderate mitral regurgitation by 2D echo 07/05/2016 with normal LV function.  Hyperlipidemia History of hyperlipidemia not on statin therapy.  Chronic diastolic heart failure (HCC) History of chronic diastolic heart failure on furosemide.      Travis Harp MD FACP,FACC,FAHA, [facility] 02/06/2018 12:29 PM

## 2018-02-19 ENCOUNTER — Encounter (HOSPITAL_COMMUNITY): Payer: Self-pay | Admitting: Emergency Medicine

## 2018-02-19 ENCOUNTER — Emergency Department (HOSPITAL_COMMUNITY)
Admission: EM | Admit: 2018-02-19 | Discharge: 2018-02-19 | Disposition: A | Discharge status: Home / Self Care | Payer: Medicare HMO | Attending: Emergency Medicine | Admitting: Emergency Medicine

## 2018-02-19 DIAGNOSIS — I5032 Chronic diastolic (congestive) heart failure: Secondary | ICD-10-CM | POA: Insufficient documentation

## 2018-02-19 DIAGNOSIS — N183 Chronic kidney disease, stage 3 (moderate): Secondary | ICD-10-CM | POA: Diagnosis not present

## 2018-02-19 DIAGNOSIS — I471 Supraventricular tachycardia: Secondary | ICD-10-CM | POA: Insufficient documentation

## 2018-02-19 DIAGNOSIS — R000 Tachycardia, unspecified: Secondary | ICD-10-CM | POA: Diagnosis not present

## 2018-02-19 DIAGNOSIS — R079 Chest pain, unspecified: Secondary | ICD-10-CM | POA: Diagnosis not present

## 2018-02-19 DIAGNOSIS — R0789 Other chest pain: Secondary | ICD-10-CM | POA: Diagnosis not present

## 2018-02-19 DIAGNOSIS — Z79899 Other long term (current) drug therapy: Secondary | ICD-10-CM | POA: Insufficient documentation

## 2018-02-19 LAB — CBC
HEMATOCRIT: 36.2 % — AB (ref 39.0–52.0)
HEMOGLOBIN: 11.7 g/dL — AB (ref 13.0–17.0)
MCH: 32.9 pg (ref 26.0–34.0)
MCHC: 32.3 g/dL (ref 30.0–36.0)
MCV: 101.7 fL — ABNORMAL HIGH (ref 80.0–100.0)
Platelets: 214 10*3/uL (ref 150–400)
RBC: 3.56 MIL/uL — ABNORMAL LOW (ref 4.22–5.81)
RDW: 13.8 % (ref 11.5–15.5)
WBC: 6.7 10*3/uL (ref 4.0–10.5)
nRBC: 0 % (ref 0.0–0.2)

## 2018-02-19 LAB — BASIC METABOLIC PANEL
Anion gap: 4 — ABNORMAL LOW (ref 5–15)
BUN: 26 mg/dL — AB (ref 8–23)
CALCIUM: 9 mg/dL (ref 8.9–10.3)
CHLORIDE: 103 mmol/L (ref 98–111)
CO2: 30 mmol/L (ref 22–32)
CREATININE: 1.17 mg/dL (ref 0.61–1.24)
GFR calc Af Amer: >60 mL/min (ref >60)
GFR calc non Af Amer: 52 mL/min — ABNORMAL LOW (ref >60)
Glucose, Bld: 96 mg/dL (ref 70–99)
Potassium: 4.1 mmol/L (ref 3.5–5.1)
Sodium: 137 mmol/L (ref 135–145)

## 2018-02-19 LAB — URINALYSIS, ROUTINE W REFLEX MICROSCOPIC
BILIRUBIN URINE: NEGATIVE
Glucose, UA: NEGATIVE mg/dL
Hgb urine dipstick: NEGATIVE
KETONES UR: NEGATIVE mg/dL
Nitrite: NEGATIVE
PH: 5 (ref 5.0–8.0)
PROTEIN: NEGATIVE mg/dL
SPECIFIC GRAVITY, URINE: 1.019 (ref 1.005–1.030)

## 2018-02-19 MED ORDER — CEFAZOLIN SODIUM-DEXTROSE 1-4 GM/50ML-% IV SOLN
1.0000 g | Freq: Once | INTRAVENOUS | Status: AC
  Administered 2018-02-19: 1 g via INTRAVENOUS
  Filled 2018-02-19: qty 50

## 2018-02-19 MED ORDER — CEPHALEXIN 500 MG PO CAPS: 500.0000 mg | ORAL_CAPSULE | Freq: Three times a day (TID) | ORAL | 0 refills | Status: DC

## 2018-02-19 NOTE — ED Provider Notes (Addendum)
[place] EMERGENCY DEPARTMENT Provider Note   CSN: [id number] Arrival date & time: 02/19/18  1255     History   Chief Complaint No chief complaint on file.   HPI Travis Kelly is a 84 y.o. male.  Patient with hx svt, presents c/o palpitations acute onset this AM. Symptoms acute onset, moderate, persistent, relieved after adenosine by EMS.  States he tried to squat, and took one extra dose of metoprolol, but svt persisted, so called EMS. Notes hx same. Denies recent increase in frequency of episodes. No syncope. Felt fine earlier today and yesterday, no preceding symptoms. No fever or chills. No chest pain or discomfort. No unusual doe. Denies recent change in symptoms.   The history is provided by a relative and the EMS personnel.    Past Medical History:  Diagnosis Date  . LAFB (left anterior fascicular block)   . Macular degeneration   . Mitral valve disorder    Moderate MR  . Nephrolithiasis 2013  . PSVT (paroxysmal supraventricular tachycardia) ([place]) 06/2016    Patient Active Problem List   Diagnosis Date Noted  . Chronic diastolic heart failure ([place]) 07/10/2017  . Chronic renal insufficiency, stage 3 (moderate) ([place]) 07/10/2017  . Knee pain 03/09/2017  . Degenerative joint disease of shoulder region 03/09/2017  . Osteoarthritis of knee 03/09/2017  . LAFB (left anterior fascicular block) 03/09/2017  . Dyspnea on exertion 02/24/2017  . Moderate mitral regurgitation 11/08/2016  . Hyperlipidemia 11/08/2016  . Elevated troponin 07/05/2016  . Normochromic normocytic anemia 07/05/2016  . History of PSVT  07/04/2016  . Age-related macular degeneration, dry, both eyes 04/12/2016  . Exudative age-related macular degeneration of right eye ([place]) 04/12/2016  . Blepharitis of upper and lower eyelids of both eyes 04/07/2015  . Macular degeneration, age related, nonexudative 04/07/2015  . Pseudophakia of both eyes 04/07/2015  . PVD (posterior vitreous  detachment), left 04/07/2015  . Status post cataract extraction and insertion of intraocular lens 01/25/2012  . Cataract, immature 05/10/2011  . Macular degeneration 05/10/2011  . Shoulder pain, bilateral 05/10/2011  . Calculus of kidney 08/13/2010  . Mitral valve disorder 08/12/2010  . Brachial neuritis or radiculitis 07/19/2010  . Cervical spondylosis without myelopathy 07/19/2010  . Lipoma 04/29/2010  . Other chronic pulmonary heart diseases 04/29/2010  . Left bundle branch hemiblock 02/25/2009  . Dyslipidemia 02/07/2007  . Esophageal reflux 02/07/2007  . Osteoarthrosis involving lower leg 02/07/2007  . Unspecified hyperplasia of prostate without urinary obstruction and other lower urinary tract symptoms (LUTS) 02/07/2007    Past Surgical History:  Procedure Laterality Date  . CATARACT EXTRACTION  04/2011        Home Medications    Prior to Admission medications   Medication Sig Start Date End Date Taking? Authorizing Provider  Cholecalciferol (VITAMIN D) 2000 units CAPS Take 2,000 Units by mouth daily.    [provider]  finasteride (PROSCAR) 5 MG tablet Take 5 mg by mouth daily. 07/25/13   [provider]  furosemide (LASIX) 20 MG tablet TAKE 1 TABLET EVERY DAY Patient taking differently: Take 20 mg by mouth 3 (three) times a week.  11/29/17   Lorretta Harp, MD  Glucosamine HCl 1000 MG TABS Take 1 tablet by mouth daily.    [provider]  Lutein 6 MG CAPS Take 1 capsule by mouth.    [provider]  meloxicam (MOBIC) 15 MG tablet Take 15 mg by mouth daily as needed for pain. 06/24/13   [provider]  metoprolol tartrate (LOPRESSOR) 25 MG tablet Take 0.5 tablets (12.5 mg total) by mouth 2 (two) times daily. May take an extra 12.5 mg by mouth as needed for fast heart rate 09/06/17   Kilroy, Doreene Burke, PA-C  Misc Natural Products (TURMERIC CURCUMIN) CAPS Take 1 capsule by mouth daily.    [provider]  Multiple Vitamin  (MULTIVITAMIN) tablet Take 1 tablet by mouth daily.    [provider]  Multiple Vitamins-Minerals ([place]) CAPS Take 1 tablet by mouth daily.    [provider]  RA KRILL OIL 500 MG CAPS Take 500 mg by mouth daily.    [provider]  vitamin C (ASCORBIC ACID) 500 MG tablet Take 500 mg by mouth daily.    [provider]    Family History Family History  Problem Relation Age of Onset  . Cancer Mother   . Pneumonia Father     Social History Social History   Tobacco Use  . Smoking status: Never Smoker  . Smokeless tobacco: Never Used  Substance Use Topics  . Alcohol use: Not on file  . Drug use: Not on file     Allergies   Barbital   Review of Systems Review of Systems  Constitutional: Negative for fever.  HENT: Negative for sore throat.   Eyes: Negative for redness.  Respiratory: Negative for cough and shortness of breath.   Cardiovascular: Positive for palpitations. Negative for chest pain and leg swelling.  Gastrointestinal: Negative for abdominal pain, blood in stool, diarrhea and vomiting.  Genitourinary: Negative for dysuria and flank pain.  Musculoskeletal: Negative for back pain and neck pain.  Skin: Negative for rash.  Neurological: Negative for headaches.  Hematological: Does not bruise/bleed easily.  Psychiatric/Behavioral: Negative for confusion.     Physical Exam Updated Vital Signs BP 103/86   Pulse 76   Temp 98.7 F (37.1 C) (Oral)   Resp 14   SpO2 93%   Physical Exam  Constitutional: He appears well-developed and well-nourished.  HENT:  Mouth/Throat: Oropharynx is clear and moist.  Eyes: Conjunctivae are normal.  Neck: Neck supple. No tracheal deviation present. No thyromegaly present.  Cardiovascular: Normal rate, regular rhythm, normal heart sounds and intact distal pulses. Exam reveals no gallop and no friction rub.  No murmur heard. Pulmonary/Chest: Effort normal and breath sounds  normal. No accessory muscle usage. No respiratory distress.  Abdominal: Soft. Bowel sounds are normal. He exhibits no distension and no mass. There is no tenderness. There is no guarding.  Musculoskeletal: He exhibits no edema or tenderness.  Neurological: He is alert.  Alert, oriented. Speech clear/fluent.   Skin: Skin is warm and dry. No rash noted.  Psychiatric: He has a normal mood and affect.  Nursing note and vitals reviewed.    ED Treatments / Results  Labs (all labs ordered are listed, but only abnormal results are displayed) Results for orders placed or performed during the hospital encounter of 02/19/18  CBC  Result Value Ref Range   WBC 6.7 4.0 - 10.5 K/uL   RBC 3.56 (L) 4.22 - 5.81 MIL/uL   Hemoglobin 11.7 (L) 13.0 - 17.0 g/dL   HCT 36.2 (L) 39.0 - 52.0 %   MCV 101.7 (H) 80.0 - 100.0 fL   MCH 32.9 26.0 - 34.0 pg   MCHC 32.3 30.0 - 36.0 g/dL   RDW 13.8 11.5 - 15.5 %   Platelets 214 150 - 400 K/uL   nRBC 0.0 0.0 -  0.2 %  Basic metabolic panel  Result Value Ref Range   Sodium 137 135 - 145 mmol/L   Potassium 4.1 3.5 - 5.1 mmol/L   Chloride 103 98 - 111 mmol/L   CO2 30 22 - 32 mmol/L   Glucose, Bld 96 70 - 99 mg/dL   BUN 26 (H) 8 - 23 mg/dL   Creatinine, Ser 1.17 0.61 - 1.24 mg/dL   Calcium 9.0 8.9 - 10.3 mg/dL   GFR calc non Af Amer 52 (L) >60 mL/min   GFR calc Af Amer >60 >60 mL/min   Anion gap 4 (L) 5 - 15  Urinalysis, Routine w reflex microscopic  Result Value Ref Range   Color, Urine AMBER (A) YELLOW   APPearance HAZY (A) CLEAR   Specific Gravity, Urine 1.019 1.005 - 1.030   pH 5.0 5.0 - 8.0   Glucose, UA NEGATIVE NEGATIVE mg/dL   Hgb urine dipstick NEGATIVE NEGATIVE   Bilirubin Urine NEGATIVE NEGATIVE   Ketones, ur NEGATIVE NEGATIVE mg/dL   Protein, ur NEGATIVE NEGATIVE mg/dL   Nitrite NEGATIVE NEGATIVE   Leukocytes, UA TRACE (A) NEGATIVE   RBC / HPF 11-20 0 - 5 RBC/hpf   WBC, UA 6-10 0 - 5 WBC/hpf   Bacteria, UA RARE (A) NONE SEEN   Squamous  Epithelial / LPF 0-5 0 - 5   Mucus PRESENT     EKG EKG Interpretation  Date/Time:  Monday February 19 2018 14:18:21 EST Ventricular Rate:  64 PR Interval:    QRS Duration: 122 QT Interval:  459 QTC Calculation: 474 R Axis:   -61 Text Interpretation:  Sinus rhythm Ventricular premature complex Nonspecific IVCD with LAD Left ventricular hypertrophy Confirmed by Lajean Saver [phone]) on 02/19/2018 2:32:23 PM   Radiology No results found.  Procedures Procedures (including critical care time)  Medications Ordered in ED Medications - No data to display   Initial Impression / Assessment and Plan / ED Course  I have reviewed the triage vital signs and the nursing notes.  Pertinent labs & imaging results that were available during my care of the patient were reviewed by me and considered in my medical decision making (see chart for details).  Iv ns. Continuous pulse ox and monitor.   Ecg. Labs.  Reviewed nursing notes and prior charts for additional history.  Pt did have narrow complex tachy, hr approx 200, resolved w adenosine 6 mg ivp by EMS prior to arrival.   Pt remains asymptomatic in ED.  Labs reviewed - chem normal.  Recheck, remains in sinus rhythm. Pt now indicates recent urinary urgency and notes remote hx uti - pt requests we check urine. UA ordered.   ua with 6-10 wbc, le pos. Will culture and rx as reports symptoms.   Ancef iv.  rx for home.       Final Clinical Impressions(s) / ED Diagnoses   Final diagnoses:  None    ED Discharge Orders    None          Lajean Saver, MD 02/19/18 1559    Lajean Saver, MD 02/19/18 [phone]

## 2018-02-19 NOTE — Discharge Instructions (Signed)
It was our pleasure to provide your ER care today - we hope that you feel better.  Continue current medication. Follow up with cardiologist in the next 1-2 weeks.   Return to ER if worse, new symptoms, persistent fast heart beat, fainting, chest pain, trouble breathing, other concern.

## 2018-02-19 NOTE — ED Notes (Signed)
Patient denies pain and is resting comfortably.  

## 2018-02-19 NOTE — ED Notes (Signed)
ED Provider at bedside. 

## 2018-02-19 NOTE — ED Notes (Signed)
Pt up to restroom with wheelchair, instructed to provide urine sample

## 2018-02-19 NOTE — ED Triage Notes (Signed)
Patient arrived via EMS from [place] at [place] related to SVT.  He reports that he took 12.5mg  metoprolol before breakfast, when he started to feel "fast heart beat" he took another 12.5mg  Metoprolol and called EMS  EMS gave 6mg  Adenosine. Patient is in NSR on arrival, he A&O X4, denies chest pain.

## 2018-02-21 LAB — URINE CULTURE

## 2018-02-23 DIAGNOSIS — R300 Dysuria: Secondary | ICD-10-CM | POA: Diagnosis not present

## 2018-03-21 ENCOUNTER — Other Ambulatory Visit: Payer: Self-pay | Admitting: Cardiology

## 2018-03-21 MED ORDER — METOPROLOL TARTRATE 25 MG PO TABS: 25.0000 mg | ORAL_TABLET | Freq: Two times a day (BID) | ORAL | 3 refills | Status: DC

## 2018-04-02 ENCOUNTER — Other Ambulatory Visit: Payer: Self-pay | Admitting: Cardiovascular Disease

## 2018-04-09 ENCOUNTER — Ambulatory Visit (INDEPENDENT_AMBULATORY_CARE_PROVIDER_SITE_OTHER): Payer: Medicare HMO | Admitting: Cardiology

## 2018-04-09 ENCOUNTER — Encounter: Payer: Self-pay | Admitting: Cardiology

## 2018-04-09 VITALS — BP 96/54 | HR 148 | Ht 66.5 in | Wt 166.8 lb

## 2018-04-09 DIAGNOSIS — I471 Supraventricular tachycardia: Secondary | ICD-10-CM | POA: Diagnosis not present

## 2018-04-09 MED ORDER — METOPROLOL TARTRATE 25 MG PO TABS: 25.0000 mg | ORAL_TABLET | Freq: Three times a day (TID) | ORAL | 1 refills | Status: DC

## 2018-04-09 NOTE — Progress Notes (Signed)
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 He just stopped driving 3 years ago because of macular degeneration degeneration.  He lives in [place] with his wife.  His cardiologist was Dr. Marc Morgans but hehas sincedtransitionedto Dr Fernanda Drum of [place].  He has a history of moderate mitral regurgitation with normal LVF by echo March 2018.  He wishes no invasive procedures for his mitral valve.  [facility] which brokewith Valsalva maneuver and was placed on low dose metoprolol.   He has had occasional episodes of PSVT since which have similarly broke with vagal maneuvers.  He did end up in the ED in Nov 2019 with palpitations but was in NSR-64 by the time he was evaluated.  He was treated for a suspected UTI then (though f/u urine culture was negative).  He is fairly active.  He is on Lasix 20 mg three times a week for chronic combined CHF and his weight has been stable, 166-171 lbs.   He is in the office today after his family called saying that he was in a tachycardia rhythm.  The patient says this started this morning.  He took his morning dose of metoprolol, he is now on 25 mg twice daily, and waited hoping it would correct itself.  He also had an episode over the weekend that lasted two hours and converted spontaneously.  When today's episode had not corrected itself by lunchtime he called his family who called [place] and I asked him to come over the office.  An EKG confirms a tachycardia rhythm with a rate of 150.  He is tolerating this pretty well.  I offered admission but he wanted to avoid this if possible.  I suggested he take an extra 25 mg of metoprolol and go home and rest but that if  he wasn't back in NSR by later this afternoon he should go to the ED. ( I reviewed the above with Dr Martinique who was DOD).  Before the patient left I tried a carotid massage and he converted to NSR- 72.  He'll increase his Lopressor to 25 mg  TID.  I'll see him back in two weeks on a day Dr Gwenlyn Found is in the office.  I discussed the possibility of an EP evaluation and possible RFA.  They will consider this.  I took the liberty of making his an appointment at the end of January with EP, he can always cancel if he decides against it but I suggested he go through with it if he has another episode on the increased Lopressor dose.    Current Outpatient Medications  Medication Sig Dispense Refill  . cephALEXin (KEFLEX) 500 MG capsule Take 1 capsule (500 mg total) by mouth 3 (three) times daily. 15 capsule 0  . Cholecalciferol (VITAMIN D) 2000 units CAPS Take 2,000 Units by mouth daily.    . Cyanocobalamin (VITAMIN B 12 PO) Take 5,000 tablets by mouth daily.    . finasteride (PROSCAR) 5 MG tablet Take 5 mg by mouth daily.    . furosemide (LASIX) 20 MG tablet TAKE 1 TABLET EVERY DAY 90 tablet 0  . Glucosamine HCl 1000 MG TABS Take 1 tablet by mouth daily.    . Lutein 6 MG CAPS  Take 1 capsule by mouth.    . meloxicam (MOBIC) 15 MG tablet Take 15 mg by mouth daily as needed for pain.    . metoprolol tartrate (LOPRESSOR) 25 MG tablet Take 1 tablet (25 mg total) by mouth 2 (two) times daily. May take an extra 12.5 mg by mouth as needed for fast heart rate 180 tablet 3  . Misc Natural Products (TURMERIC CURCUMIN) CAPS Take 1 capsule by mouth daily.    . Multiple Vitamin (MULTIVITAMIN) tablet Take 1 tablet by mouth daily.    . Multiple Vitamins-Minerals (OCUVITE EYE HEALTH FORMULA) CAPS Take 1 tablet by mouth daily.    . naproxen (NAPROSYN) 375 MG tablet Take 375 mg by mouth daily as needed for mild pain.    Marland Kitchen RA KRILL OIL 500 MG CAPS Take 500 mg by mouth daily.    . vitamin C (ASCORBIC ACID) 500 MG tablet Take  500 mg by mouth daily.     No current facility-administered medications for this visit.     Allergies  Allergen Reactions  . Barbital     Past Medical History:  Diagnosis Date  . LAFB (left anterior fascicular block)   . Macular degeneration   . Mitral valve disorder    Moderate MR  . Nephrolithiasis 2013  . PSVT (paroxysmal supraventricular tachycardia) ([place]) 06/2016    Social History   Socioeconomic History  . Marital status: Married    Spouse name: Not on file  . Number of children: Not on file  . Years of education: Not on file  . Highest education level: Not on file  Occupational History  . Not on file  Social Needs  . Financial resource strain: Not on file  . Food insecurity:    Worry: Not on file    Inability: Not on file  . Transportation needs:    Medical: Not on file    Non-medical: Not on file  Tobacco Use  . Smoking status: Never Smoker  . Smokeless tobacco: Never Used  Substance and Sexual Activity  . Alcohol use: Not on file  . Drug use: Not on file  . Sexual activity: Not on file  Lifestyle  . Physical activity:    Days per week: Not on file    Minutes per session: Not on file  . Stress: Not on file  Relationships  . Social connections:    Talks on phone: Not on file    Gets together: Not on file    Attends religious service: Not on file    Active member of club or organization: Not on file    Attends meetings of clubs or organizations: Not on file    Relationship status: Not on file  . Intimate partner violence:    Fear of current or ex partner: Not on file    Emotionally abused: Not on file    Physically abused: Not on file    Forced sexual activity: Not on file  Other Topics Concern  . Not on file  Social History Narrative  . Not on file     Family History  Problem Relation Age of Onset  . Cancer Mother   . Pneumonia Father      Review of Systems: General: negative for chills, fever, night sweats or weight changes.    Cardiovascular: negative for chest pain, dyspnea on exertion, edema, orthopnea, paroxysmal nocturnal dyspnea or shortness of breath Dermatological: negative for rash Respiratory: negative for cough or wheezing Urologic: negative for hematuria Abdominal: negative  for nausea, vomiting, diarrhea, bright red blood per rectum, melena, or hematemesis Neurologic: negative for visual changes, syncope, or dizziness All other systems reviewed and are otherwise negative except as noted above.    Height 5' 6.5" (1.689 m), weight 166 lb 12.8 oz (75.7 kg).  General appearance: alert, cooperative, appears stated age and no distress Lungs: clear to auscultation bilaterally Heart: regular rate and rhythm and 2/6 MR murmur Extremities: no edema Skin: pale cool dry Neurologic: Grossly normal  EKG #1- SVT at 148 EKG #2- NSR- 72, one PVC, LAFB  ASSESSMENT AND PLAN:   History of PSVT  Recurrent PSVT. He is usually able to stop these with valsalva. Metoprolol increased to 25 mg BID in Nov for recurrent PSVT.  Today's episode broke with carotid massage.   Moderate mitral regurgitation Moderate mitral regurgitation with normal LVF by echo march 2018  Chronic diastolic heart failure (HCC) Stable. His goal weight looks to be between 170-165 lbs. I suggested he take an extra Lasix if he feels he needs it but not more that two doses without contacting us.  Chronic renal insufficiency, stage 3 (moderate) (HCC) GFR 52- Nov 2019  PLAN  Reviewed with Dr Martinique in the office today.  Increase lopressor to 25 mg TID.  F/U two weeks.  They will consider EP evaluation.   Kerin Ransom PA-C 04/09/2018 12:20 PM

## 2018-04-09 NOTE — Patient Instructions (Addendum)
Medication Instructions:  Increase Metoprolol to 25 mg three times daily.   If you need a refill on your cardiac medications before your next appointment, please call your pharmacy.    Follow-Up: At [facility], you and your health needs are our priority.  As part of our continuing mission to provide you with exceptional heart care, we have created designated Provider Care Teams.  These Care Teams include your primary Cardiologist (physician) and Advanced Practice Providers (APPs -  Physician Assistants and Nurse Practitioners) who all work together to provide you with the care you need, when you need it. . Follow- up appointment scheduled with Kerin Ransom, PA on January 10 at 11:30 AM.  . Consult appointment scheduled with Crissie Sickles, MD on January 28 at 10:15 AM.   Any Other Special Instructions Will Be Listed Below (If Applicable). None

## 2018-04-16 ENCOUNTER — Emergency Department (HOSPITAL_COMMUNITY): Payer: No Typology Code available for payment source

## 2018-04-16 ENCOUNTER — Other Ambulatory Visit: Payer: Self-pay

## 2018-04-16 ENCOUNTER — Encounter (HOSPITAL_COMMUNITY): Payer: Self-pay | Admitting: Emergency Medicine

## 2018-04-16 ENCOUNTER — Inpatient Hospital Stay (HOSPITAL_COMMUNITY)
Admission: EM | Admit: 2018-04-16 | Discharge: 2018-04-20 | DRG: 292 | Disposition: A | Discharge status: Home Health | Payer: No Typology Code available for payment source | Attending: Internal Medicine | Admitting: Internal Medicine

## 2018-04-16 DIAGNOSIS — I5033 Acute on chronic diastolic (congestive) heart failure: Principal | ICD-10-CM | POA: Diagnosis present

## 2018-04-16 DIAGNOSIS — Z9842 Cataract extraction status, left eye: Secondary | ICD-10-CM | POA: Diagnosis not present

## 2018-04-16 DIAGNOSIS — Z79899 Other long term (current) drug therapy: Secondary | ICD-10-CM

## 2018-04-16 DIAGNOSIS — Z961 Presence of intraocular lens: Secondary | ICD-10-CM | POA: Diagnosis present

## 2018-04-16 DIAGNOSIS — H353210 Exudative age-related macular degeneration, right eye, stage unspecified: Secondary | ICD-10-CM | POA: Diagnosis present

## 2018-04-16 DIAGNOSIS — R002 Palpitations: Secondary | ICD-10-CM | POA: Diagnosis present

## 2018-04-16 DIAGNOSIS — I5031 Acute diastolic (congestive) heart failure: Secondary | ICD-10-CM | POA: Diagnosis not present

## 2018-04-16 DIAGNOSIS — R52 Pain, unspecified: Secondary | ICD-10-CM

## 2018-04-16 DIAGNOSIS — Z791 Long term (current) use of non-steroidal anti-inflammatories (NSAID): Secondary | ICD-10-CM

## 2018-04-16 DIAGNOSIS — N179 Acute kidney failure, unspecified: Secondary | ICD-10-CM | POA: Diagnosis not present

## 2018-04-16 DIAGNOSIS — R945 Abnormal results of liver function studies: Secondary | ICD-10-CM | POA: Diagnosis present

## 2018-04-16 DIAGNOSIS — I480 Paroxysmal atrial fibrillation: Secondary | ICD-10-CM | POA: Diagnosis present

## 2018-04-16 DIAGNOSIS — Z960 Presence of urogenital implants: Secondary | ICD-10-CM | POA: Diagnosis present

## 2018-04-16 DIAGNOSIS — I959 Hypotension, unspecified: Secondary | ICD-10-CM | POA: Diagnosis not present

## 2018-04-16 DIAGNOSIS — E860 Dehydration: Secondary | ICD-10-CM | POA: Diagnosis not present

## 2018-04-16 DIAGNOSIS — M19011 Primary osteoarthritis, right shoulder: Secondary | ICD-10-CM | POA: Diagnosis present

## 2018-04-16 DIAGNOSIS — Z87891 Personal history of nicotine dependence: Secondary | ICD-10-CM | POA: Diagnosis not present

## 2018-04-16 DIAGNOSIS — M25562 Pain in left knee: Secondary | ICD-10-CM | POA: Diagnosis not present

## 2018-04-16 DIAGNOSIS — Z9181 History of falling: Secondary | ICD-10-CM

## 2018-04-16 DIAGNOSIS — M009 Pyogenic arthritis, unspecified: Secondary | ICD-10-CM | POA: Diagnosis not present

## 2018-04-16 DIAGNOSIS — N183 Chronic kidney disease, stage 3 unspecified: Secondary | ICD-10-CM | POA: Diagnosis present

## 2018-04-16 DIAGNOSIS — R0902 Hypoxemia: Secondary | ICD-10-CM | POA: Diagnosis not present

## 2018-04-16 DIAGNOSIS — I370 Nonrheumatic pulmonary valve stenosis: Secondary | ICD-10-CM | POA: Diagnosis not present

## 2018-04-16 DIAGNOSIS — Z8711 Personal history of peptic ulcer disease: Secondary | ICD-10-CM

## 2018-04-16 DIAGNOSIS — Z87442 Personal history of urinary calculi: Secondary | ICD-10-CM

## 2018-04-16 DIAGNOSIS — I340 Nonrheumatic mitral (valve) insufficiency: Secondary | ICD-10-CM | POA: Diagnosis not present

## 2018-04-16 DIAGNOSIS — I471 Supraventricular tachycardia: Secondary | ICD-10-CM | POA: Diagnosis not present

## 2018-04-16 DIAGNOSIS — G4489 Other headache syndrome: Secondary | ICD-10-CM | POA: Diagnosis not present

## 2018-04-16 DIAGNOSIS — I447 Left bundle-branch block, unspecified: Secondary | ICD-10-CM | POA: Diagnosis not present

## 2018-04-16 DIAGNOSIS — M1711 Unilateral primary osteoarthritis, right knee: Secondary | ICD-10-CM | POA: Diagnosis present

## 2018-04-16 DIAGNOSIS — R778 Other specified abnormalities of plasma proteins: Secondary | ICD-10-CM | POA: Diagnosis present

## 2018-04-16 DIAGNOSIS — K219 Gastro-esophageal reflux disease without esophagitis: Secondary | ICD-10-CM | POA: Diagnosis present

## 2018-04-16 DIAGNOSIS — M6281 Muscle weakness (generalized): Secondary | ICD-10-CM | POA: Diagnosis not present

## 2018-04-16 DIAGNOSIS — I5042 Chronic combined systolic (congestive) and diastolic (congestive) heart failure: Secondary | ICD-10-CM | POA: Diagnosis present

## 2018-04-16 DIAGNOSIS — M11261 Other chondrocalcinosis, right knee: Secondary | ICD-10-CM | POA: Diagnosis not present

## 2018-04-16 DIAGNOSIS — I080 Rheumatic disorders of both mitral and aortic valves: Secondary | ICD-10-CM | POA: Diagnosis not present

## 2018-04-16 DIAGNOSIS — N400 Enlarged prostate without lower urinary tract symptoms: Secondary | ICD-10-CM | POA: Diagnosis present

## 2018-04-16 DIAGNOSIS — I5043 Acute on chronic combined systolic (congestive) and diastolic (congestive) heart failure: Secondary | ICD-10-CM

## 2018-04-16 DIAGNOSIS — R262 Difficulty in walking, not elsewhere classified: Secondary | ICD-10-CM | POA: Diagnosis not present

## 2018-04-16 DIAGNOSIS — R7989 Other specified abnormal findings of blood chemistry: Secondary | ICD-10-CM | POA: Diagnosis not present

## 2018-04-16 DIAGNOSIS — E785 Hyperlipidemia, unspecified: Secondary | ICD-10-CM | POA: Diagnosis present

## 2018-04-16 DIAGNOSIS — M25561 Pain in right knee: Secondary | ICD-10-CM | POA: Diagnosis not present

## 2018-04-16 DIAGNOSIS — H353120 Nonexudative age-related macular degeneration, left eye, stage unspecified: Secondary | ICD-10-CM | POA: Diagnosis present

## 2018-04-16 DIAGNOSIS — I5032 Chronic diastolic (congestive) heart failure: Secondary | ICD-10-CM | POA: Diagnosis present

## 2018-04-16 DIAGNOSIS — I444 Left anterior fascicular block: Secondary | ICD-10-CM | POA: Diagnosis not present

## 2018-04-16 DIAGNOSIS — Z888 Allergy status to other drugs, medicaments and biological substances status: Secondary | ICD-10-CM

## 2018-04-16 DIAGNOSIS — Z9841 Cataract extraction status, right eye: Secondary | ICD-10-CM

## 2018-04-16 DIAGNOSIS — M19012 Primary osteoarthritis, left shoulder: Secondary | ICD-10-CM | POA: Diagnosis present

## 2018-04-16 DIAGNOSIS — Z7951 Long term (current) use of inhaled steroids: Secondary | ICD-10-CM

## 2018-04-16 DIAGNOSIS — R112 Nausea with vomiting, unspecified: Secondary | ICD-10-CM | POA: Diagnosis not present

## 2018-04-16 DIAGNOSIS — N281 Cyst of kidney, acquired: Secondary | ICD-10-CM | POA: Diagnosis not present

## 2018-04-16 DIAGNOSIS — Z836 Family history of other diseases of the respiratory system: Secondary | ICD-10-CM

## 2018-04-16 DIAGNOSIS — I4891 Unspecified atrial fibrillation: Secondary | ICD-10-CM | POA: Diagnosis not present

## 2018-04-16 DIAGNOSIS — I361 Nonrheumatic tricuspid (valve) insufficiency: Secondary | ICD-10-CM | POA: Diagnosis not present

## 2018-04-16 HISTORY — DX: Heart failure, unspecified: I50.9

## 2018-04-16 HISTORY — DX: Pneumonia, unspecified organism: J18.9

## 2018-04-16 HISTORY — DX: Personal history of other diseases of the digestive system: Z87.19

## 2018-04-16 HISTORY — DX: Nonexudative age-related macular degeneration, left eye, stage unspecified: H35.3120

## 2018-04-16 HISTORY — DX: Unspecified osteoarthritis, unspecified site: M19.90

## 2018-04-16 HISTORY — DX: Gastro-esophageal reflux disease without esophagitis: K21.9

## 2018-04-16 HISTORY — DX: Exudative age-related macular degeneration, right eye, stage unspecified: H35.3210

## 2018-04-16 HISTORY — DX: Cardiac murmur, unspecified: R01.1

## 2018-04-16 LAB — COMPREHENSIVE METABOLIC PANEL
ALBUMIN: 3.6 g/dL (ref 3.5–5.0)
ALK PHOS: 73 U/L (ref 38–126)
ALT: 72 U/L — ABNORMAL HIGH (ref 0–44)
ANION GAP: 9 (ref 5–15)
AST: 78 U/L — ABNORMAL HIGH (ref 15–41)
BUN: 28 mg/dL — AB (ref 8–23)
CALCIUM: 8.7 mg/dL — AB (ref 8.9–10.3)
CO2: 23 mmol/L (ref 22–32)
Chloride: 106 mmol/L (ref 98–111)
Creatinine, Ser: 1.2 mg/dL (ref 0.61–1.24)
GFR calc Af Amer: >60 mL/min (ref >60)
GFR calc non Af Amer: 52 mL/min — ABNORMAL LOW (ref >60)
GLUCOSE: 109 mg/dL — AB (ref 70–99)
POTASSIUM: 4.1 mmol/L (ref 3.5–5.1)
SODIUM: 138 mmol/L (ref 135–145)
Total Bilirubin: 0.8 mg/dL (ref 0.3–1.2)
Total Protein: 6.9 g/dL (ref 6.5–8.1)

## 2018-04-16 LAB — CBC WITH DIFFERENTIAL/PLATELET
ABS IMMATURE GRANULOCYTES: 0.02 10*3/uL (ref 0.00–0.07)
BASOS ABS: 0 10*3/uL (ref 0.0–0.1)
Basophils Relative: 1 %
Eosinophils Absolute: 0.1 10*3/uL (ref 0.0–0.5)
Eosinophils Relative: 1 %
HCT: 37.1 % — ABNORMAL LOW (ref 39.0–52.0)
HEMOGLOBIN: 11.9 g/dL — AB (ref 13.0–17.0)
Immature Granulocytes: 0 %
LYMPHS PCT: 18 %
Lymphs Abs: 1.2 10*3/uL (ref 0.7–4.0)
MCH: 32.7 pg (ref 26.0–34.0)
MCHC: 32.1 g/dL (ref 30.0–36.0)
MCV: 101.9 fL — ABNORMAL HIGH (ref 80.0–100.0)
Monocytes Absolute: 0.6 10*3/uL (ref 0.1–1.0)
Monocytes Relative: 9 %
NEUTROS ABS: 4.6 10*3/uL (ref 1.7–7.7)
NRBC: 0 % (ref 0.0–0.2)
Neutrophils Relative %: 71 %
Platelets: 248 10*3/uL (ref 150–400)
RBC: 3.64 MIL/uL — AB (ref 4.22–5.81)
RDW: 14.1 % (ref 11.5–15.5)
WBC: 6.5 10*3/uL (ref 4.0–10.5)

## 2018-04-16 LAB — BRAIN NATRIURETIC PEPTIDE: B Natriuretic Peptide: 1745.6 pg/mL — ABNORMAL HIGH (ref 0.0–100.0)

## 2018-04-16 LAB — TROPONIN I: Troponin I: 0.05 ng/mL (ref <0.03)

## 2018-04-16 LAB — MAGNESIUM: MAGNESIUM: 2.1 mg/dL (ref 1.7–2.4)

## 2018-04-16 MED ORDER — VITAMIN B-12 1000 MCG PO TABS
1000.0000 ug | ORAL_TABLET | Freq: Every day | ORAL | Status: DC
  Administered 2018-04-17 – 2018-04-20 (×4): 1000 ug via ORAL
  Filled 2018-04-16 (×5): qty 1

## 2018-04-16 MED ORDER — GLUCOSAMINE HCL 1000 MG PO TABS: 1.0000 | ORAL_TABLET | Freq: Every day | ORAL | Status: DC

## 2018-04-16 MED ORDER — METOPROLOL TARTRATE 25 MG PO TABS: 25.0000 mg | ORAL_TABLET | Freq: Once | ORAL | Status: DC

## 2018-04-16 MED ORDER — CALCIUM CARBONATE-VITAMIN D 500-200 MG-UNIT PO TABS
1.0000 | ORAL_TABLET | Freq: Every day | ORAL | Status: DC
  Administered 2018-04-17 – 2018-04-20 (×4): 1 via ORAL
  Filled 2018-04-16 (×5): qty 1

## 2018-04-16 MED ORDER — LABETALOL HCL 5 MG/ML IV SOLN
20.0000 mg | Freq: Once | INTRAVENOUS | Status: AC
  Administered 2018-04-16: 20 mg via INTRAVENOUS
  Filled 2018-04-16: qty 4

## 2018-04-16 MED ORDER — FLUTICASONE PROPIONATE 50 MCG/ACT NA SUSP: 1.0000 | Freq: Every day | NASAL | Status: DC | PRN

## 2018-04-16 MED ORDER — VITAMIN D 25 MCG (1000 UNIT) PO TABS
2000.0000 [IU] | ORAL_TABLET | Freq: Every day | ORAL | Status: DC
  Administered 2018-04-17 – 2018-04-20 (×4): 2000 [IU] via ORAL
  Filled 2018-04-16 (×4): qty 2

## 2018-04-16 MED ORDER — ONDANSETRON HCL 4 MG/2ML IJ SOLN
4.0000 mg | Freq: Four times a day (QID) | INTRAMUSCULAR | Status: DC | PRN
  Administered 2018-04-19: 4 mg via INTRAVENOUS
  Filled 2018-04-16: qty 2

## 2018-04-16 MED ORDER — SODIUM CHLORIDE 0.9% FLUSH
3.0000 mL | Freq: Two times a day (BID) | INTRAVENOUS | Status: DC
  Administered 2018-04-17 – 2018-04-19 (×5): 3 mL via INTRAVENOUS

## 2018-04-16 MED ORDER — FINASTERIDE 5 MG PO TABS
5.0000 mg | ORAL_TABLET | Freq: Every day | ORAL | Status: DC
  Administered 2018-04-17 – 2018-04-19 (×3): 5 mg via ORAL
  Filled 2018-04-16 (×5): qty 1

## 2018-04-16 MED ORDER — OMEGA-3-ACID ETHYL ESTERS 1 G PO CAPS
1000.0000 mg | ORAL_CAPSULE | Freq: Every day | ORAL | Status: DC
  Administered 2018-04-17 – 2018-04-20 (×4): 1000 mg via ORAL
  Filled 2018-04-16 (×5): qty 1

## 2018-04-16 MED ORDER — ADENOSINE 6 MG/2ML IV SOLN
6.0000 mg | Freq: Once | INTRAVENOUS | Status: DC
  Filled 2018-04-16: qty 2

## 2018-04-16 MED ORDER — FUROSEMIDE 10 MG/ML IJ SOLN
40.0000 mg | Freq: Once | INTRAMUSCULAR | Status: AC
  Administered 2018-04-16: 40 mg via INTRAVENOUS
  Filled 2018-04-16: qty 4

## 2018-04-16 MED ORDER — NITROGLYCERIN 0.4 MG SL SUBL: 0.4000 mg | SUBLINGUAL_TABLET | SUBLINGUAL | Status: DC | PRN

## 2018-04-16 MED ORDER — ASPIRIN EC 81 MG PO TBEC
81.0000 mg | DELAYED_RELEASE_TABLET | Freq: Every day | ORAL | Status: DC
  Administered 2018-04-17 – 2018-04-20 (×5): 81 mg via ORAL
  Filled 2018-04-16 (×5): qty 1

## 2018-04-16 MED ORDER — SODIUM CHLORIDE 0.9 % IV SOLN: 250.0000 mL | INTRAVENOUS | Status: DC | PRN

## 2018-04-16 MED ORDER — METOPROLOL TARTRATE 5 MG/5ML IV SOLN: 5.0000 mg | Freq: Three times a day (TID) | INTRAVENOUS | Status: DC | PRN

## 2018-04-16 MED ORDER — SODIUM CHLORIDE 0.9% FLUSH: 3.0000 mL | INTRAVENOUS | Status: DC | PRN

## 2018-04-16 MED ORDER — TURMERIC CURCUMIN PO CAPS: 1.0000 | ORAL_CAPSULE | Freq: Every day | ORAL | Status: DC

## 2018-04-16 MED ORDER — ZOLPIDEM TARTRATE 5 MG PO TABS: 5.0000 mg | ORAL_TABLET | Freq: Every evening | ORAL | Status: DC | PRN

## 2018-04-16 MED ORDER — FUROSEMIDE 10 MG/ML IJ SOLN
40.0000 mg | Freq: Two times a day (BID) | INTRAMUSCULAR | Status: DC
  Administered 2018-04-17 – 2018-04-18 (×3): 40 mg via INTRAVENOUS
  Filled 2018-04-16 (×4): qty 4

## 2018-04-16 MED ORDER — VITAMIN C 500 MG PO TABS
500.0000 mg | ORAL_TABLET | Freq: Every day | ORAL | Status: DC
  Administered 2018-04-17 – 2018-04-20 (×4): 500 mg via ORAL
  Filled 2018-04-16 (×5): qty 1

## 2018-04-16 MED ORDER — METOPROLOL TARTRATE 25 MG PO TABS
25.0000 mg | ORAL_TABLET | Freq: Three times a day (TID) | ORAL | Status: DC
Start: 2018-04-16 — End: 2018-04-17
  Administered 2018-04-17 (×3): 25 mg via ORAL
  Filled 2018-04-16 (×3): qty 1

## 2018-04-16 MED ORDER — LUTEIN 6 MG PO CAPS: 1.0000 | ORAL_CAPSULE | Freq: Every day | ORAL | Status: DC

## 2018-04-16 MED ORDER — ENOXAPARIN SODIUM 40 MG/0.4ML ~~LOC~~ SOLN
40.0000 mg | Freq: Every day | SUBCUTANEOUS | Status: DC
  Administered 2018-04-17 – 2018-04-19 (×3): 40 mg via SUBCUTANEOUS
  Filled 2018-04-16 (×4): qty 0.4

## 2018-04-16 NOTE — ED Triage Notes (Signed)
Per [facility], pt from home w/ a c/o SVT. Pt has a hx of SVT and was found with an initial HR was 164. Pt denies pain. States he can tll that his heart is beating fast. Has had three dosage changes with his Metoprolol in the past month. Additional complaints of a headache, SOB, and light headedness. No LOC. No N/V.   122/82 HR 154 RR 18 98%  18 ga IV L forearm Vagal maneuvers attempted. 500 ccs given

## 2018-04-16 NOTE — ED Provider Notes (Addendum)
[place] EMERGENCY DEPARTMENT Provider Note   CSN: [id number] Arrival date & time: 04/16/18  1710     History   Chief Complaint Chief Complaint  Patient presents with  . Tachycardia    HPI Travis Kelly is a 85 y.o. male.  HPI With multiple medical issues, notably mitral valve disorder, paroxysmal SVT, heart failure, resents with palpitations. He is here with his daughter who assists with the HPI. He notes that over the past month he has been titrating his medications with his cardiologist in an effort to control frequent episodes of SVT. He is required adenosine in the past, Valsalva's maneuvers in the past, but today, after developing palpitations, he did not find relief with anything. He has mild associated generalized weakness, notes that over the past days he feels as though he is retaining fluid, has increasing fatigue, with diminished urine production in spite of taking his typical Lasix dosing. No pain, no syncope, no fever, no cough.  Past Medical History:  Diagnosis Date  . LAFB (left anterior fascicular block)   . Macular degeneration   . Mitral valve disorder    Moderate MR  . Nephrolithiasis 2013  . PSVT (paroxysmal supraventricular tachycardia) ([place]) 06/2016    Patient Active Problem List   Diagnosis Date Noted  . Chronic diastolic heart failure ([place]) 07/10/2017  . Chronic renal insufficiency, stage 3 (moderate) ([place]) 07/10/2017  . Knee pain 03/09/2017  . Degenerative joint disease of shoulder region 03/09/2017  . Osteoarthritis of knee 03/09/2017  . LAFB (left anterior fascicular block) 03/09/2017  . Dyspnea on exertion 02/24/2017  . Moderate mitral regurgitation 11/08/2016  . Hyperlipidemia 11/08/2016  . Elevated troponin 07/05/2016  . Normochromic normocytic anemia 07/05/2016  . History of PSVT  07/04/2016  . Age-related macular degeneration, dry, both eyes 04/12/2016  . Exudative age-related macular degeneration of right eye  ([place]) 04/12/2016  . Blepharitis of upper and lower eyelids of both eyes 04/07/2015  . Macular degeneration, age related, nonexudative 04/07/2015  . Pseudophakia of both eyes 04/07/2015  . PVD (posterior vitreous detachment), left 04/07/2015  . Status post cataract extraction and insertion of intraocular lens 01/25/2012  . Cataract, immature 05/10/2011  . Macular degeneration 05/10/2011  . Shoulder pain, bilateral 05/10/2011  . Calculus of kidney 08/13/2010  . Mitral valve disorder 08/12/2010  . Brachial neuritis or radiculitis 07/19/2010  . Cervical spondylosis without myelopathy 07/19/2010  . Lipoma 04/29/2010  . Other chronic pulmonary heart diseases 04/29/2010  . Left bundle branch hemiblock 02/25/2009  . Dyslipidemia 02/07/2007  . Esophageal reflux 02/07/2007  . Osteoarthrosis involving lower leg 02/07/2007  . Unspecified hyperplasia of prostate without urinary obstruction and other lower urinary tract symptoms (LUTS) 02/07/2007    Past Surgical History:  Procedure Laterality Date  . CATARACT EXTRACTION  04/2011        Home Medications    Prior to Admission medications   Medication Sig Start Date End Date Taking? Authorizing Provider  cephALEXin (KEFLEX) 500 MG capsule Take 1 capsule (500 mg total) by mouth 3 (three) times daily. 02/19/18   Lajean Saver, MD  Cholecalciferol (VITAMIN D) 2000 units CAPS Take 2,000 Units by mouth daily.    [provider]  Cyanocobalamin (VITAMIN B 12 PO) Take 5,000 tablets by mouth daily.    [provider]  finasteride (PROSCAR) 5 MG tablet Take 5 mg by mouth daily. 07/25/13   [provider]  furosemide (LASIX) 20 MG tablet TAKE 1 TABLET EVERY DAY 04/03/18  Lorretta Harp, MD  Glucosamine HCl 1000 MG TABS Take 1 tablet by mouth daily.    [provider]  Lutein 6 MG CAPS Take 1 capsule by mouth.    [provider]  meloxicam (MOBIC) 15 MG tablet Take 15 mg by mouth daily as needed for pain.  06/24/13   [provider]  metoprolol tartrate (LOPRESSOR) 25 MG tablet Take 1 tablet (25 mg total) by mouth 3 (three) times daily. 04/09/18   Erlene Quan, PA-C  Misc Natural Products (TURMERIC CURCUMIN) CAPS Take 1 capsule by mouth daily.    [provider]  Multiple Vitamin (MULTIVITAMIN) tablet Take 1 tablet by mouth daily.    [provider]  Multiple Vitamins-Minerals ([place]) CAPS Take 1 tablet by mouth daily.    [provider]  naproxen (NAPROSYN) 375 MG tablet Take 375 mg by mouth daily as needed for mild pain.    [provider]  RA KRILL OIL 500 MG CAPS Take 500 mg by mouth daily.    [provider]  vitamin C (ASCORBIC ACID) 500 MG tablet Take 500 mg by mouth daily.    [provider]    Family History Family History  Problem Relation Age of Onset  . Cancer Mother   . Pneumonia Father     Social History Social History   Tobacco Use  . Smoking status: Never Smoker  . Smokeless tobacco: Never Used  Substance Use Topics  . Alcohol use: Yes    Comment: occ wine  . Drug use: Never     Allergies   Barbital   Review of Systems Review of Systems  Constitutional:       Per HPI, otherwise negative  HENT:       Per HPI, otherwise negative  Respiratory:       Per HPI, otherwise negative  Cardiovascular:       Per HPI, otherwise negative  Gastrointestinal: Negative for vomiting.  Endocrine:       Negative aside from HPI  Genitourinary:       Neg aside from HPI   Musculoskeletal:       Per HPI, otherwise negative  Skin: Negative.   Neurological: Negative for syncope.     Physical Exam Updated Vital Signs BP 104/74   Pulse (!) 147   Resp 20   Ht 5\' 6"  (1.676 m)   Wt 72.6 kg   SpO2 94%   BMI 25.82 kg/m   Physical Exam Vitals signs and nursing note reviewed.  Constitutional:      General: He is not in acute distress.    Appearance: He is well-developed.  HENT:      Head: Normocephalic and atraumatic.  Eyes:     Conjunctiva/sclera: Conjunctivae normal.  Cardiovascular:     Rate and Rhythm: Regular rhythm. Tachycardia present.  Pulmonary:     Breath sounds: Decreased air movement present.  Abdominal:     General: There is no distension.  Skin:    General: Skin is warm and dry.  Neurological:     Mental Status: He is alert and oriented to person, place, and time.      ED Treatments / Results  Labs (all labs ordered are listed, but only abnormal results are displayed) Labs Reviewed  COMPREHENSIVE METABOLIC PANEL - Abnormal; Notable for the following components:      Result Value   Glucose, Bld 109 (*)    BUN 28 (*)  Calcium 8.7 (*)    AST 78 (*)    ALT 72 (*)    GFR calc non Af Amer 52 (*)    All other components within normal limits  BRAIN NATRIURETIC PEPTIDE - Abnormal; Notable for the following components:   B Natriuretic Peptide 1,745.6 (*)    All other components within normal limits  TROPONIN I - Abnormal; Notable for the following components:   Troponin I 0.05 (*)    All other components within normal limits  CBC WITH DIFFERENTIAL/PLATELET - Abnormal; Notable for the following components:   RBC 3.64 (*)    Hemoglobin 11.9 (*)    HCT 37.1 (*)    MCV 101.9 (*)    All other components within normal limits  MAGNESIUM    EKG EKG Interpretation  Date/Time:  Monday April 16 2018 17:11:12 EST Ventricular Rate:  148 PR Interval:    QRS Duration: 124 QT Interval:  344 QTC Calculation: 540 R Axis:   -71 Text Interpretation:  Supraventricular tachycardia Abnormal ekg Confirmed by Carmin Muskrat [phone]) on 04/16/2018 5:13:38 PM   Radiology Dg Chest Port 1 View  Result Date: 04/16/2018 CLINICAL DATA:  Supraventricular tachycardia.  No chest pain. EXAM: PORTABLE CHEST 1 VIEW COMPARISON:  CT 07/05/2016.  Radiographs 07/04/2016. FINDINGS: 1734 hours. There are lower lung volumes with mild bibasilar atelectasis and a probable  small left pleural effusion. There is vascular congestion without overt pulmonary edema. Cardiomegaly and aortic atherosclerosis are stable. There is no pneumothorax or confluent airspace opacity. No acute osseous findings are evident. Telemetry leads overlie the chest. IMPRESSION: Cardiomegaly, vascular congestion and small left pleural effusion. No overt pulmonary edema. Electronically Signed   By: Richardean Sale M.D.   On: 04/16/2018 17:54    Procedures Procedures (including critical care time)  Medications Ordered in ED Medications  furosemide (LASIX) injection 40 mg (has no administration in time range)  metoprolol tartrate (LOPRESSOR) tablet 25 mg (has no administration in time range)     Initial Impression / Assessment and Plan / ED Course  I have reviewed the triage vital signs and the nursing notes.  Pertinent labs & imaging results that were available during my care of the patient were reviewed by me and considered in my medical decision making (see chart for details).    Initial evaluation and reviewed the patient chart, notable for echocardiogram, last year, as below:  Study Conclusions   - Left ventricle: The cavity size was normal. Wall thickness was   increased in a pattern of mild LVH. Systolic function was normal.   The estimated ejection fraction was in the range of 55% to 60%.   Wall motion was normal; there were no regional wall motion   abnormalities. The study is not technically sufficient to allow   evaluation of LV diastolic function. - Aortic valve: There was mild regurgitation. - Mitral valve: Calcified annulus. Moderate prolapse, involving the   posterior leaflet. There was moderate regurgitation. - Left atrium: The atrium was moderately dilated. - Pulmonary arteries: Systolic pressure was mildly increased. PA   peak pressure: 35 mm Hg (S).   Impressions:   - Normal LV function; mild LVH; calcified aortic valve with mild   AI: prolapse of posterior  MV leaflet with at least moderate MR   and may be severe (suggest TEE to further assess); moderate LAE;   trace TR with mildly elevated pulmonary pressure.   8:13 PM Patient findings reviewed with patient and multiple family members. Findings most  notable for substantial elevation in BNP, and given the patient's description of fatigue, weight gain, swelling, or suspicion for heart failure exacerbation, likely contributing to his intractable arrhythmia.  Patient, the patient's x-ray suggests bilateral pulmonary congestion, and pleural effusion.  Patient has a history of SVT, though with a heart rhythm in the 150 range, persistently, there is some suspicion for atrial flutter.  Patient will receive additional beta-blocker, Lasix, IV, admission for further evaluation and management. He continues to deny any chest pain, though he has mildly elevated troponin, this is consistent with multiple prior studies for him. I discussed the patient's case with his cardiology colleagues, who will follow as a consulting service.  11:50 PM Patient had converted to sinus rhythm after vagal maneuvers performed. Prior to this patient had substantial diuresis. Patient has had some improvement in his clinical condition, but given concern for heart failure exacerbation, SVT, patient will be monitored overnight.  Final Clinical Impressions(s) / ED Diagnoses  Acute exacerbation congestive heart failure, diastolic Supraventricular tachycardia  CRITICAL CARE Performed by: Carmin Muskrat Total critical care time: 35 minutes Critical care time was exclusive of separately billable procedures and treating other patients. Critical care was necessary to treat or prevent imminent or life-threatening deterioration. Critical care was time spent personally by me on the following activities: development of treatment plan with patient and/or surrogate as well as nursing, discussions with consultants, evaluation of patient's  response to treatment, examination of patient, obtaining history from patient or surrogate, ordering and performing treatments and interventions, ordering and review of laboratory studies, ordering and review of radiographic studies, pulse oximetry and re-evaluation of patient's condition.    Carmin Muskrat, MD 04/16/18 2014    Carmin Muskrat, MD 04/16/18 2351

## 2018-04-16 NOTE — Progress Notes (Signed)
PHARMACIST - PHYSICIAN ORDER COMMUNICATION  CONCERNING: P&T Medication Policy on Herbal Medications  DESCRIPTION:  This patient's order for:  Lutein  Glucosamine  Turmeric  has been noted.  This product(s) is classified as an "herbal" or natural product. Due to a lack of definitive safety studies or FDA approval, nonstandard manufacturing practices, plus the potential risk of unknown drug-drug interactions while on inpatient medications, the Pharmacy and Therapeutics Committee does not permit the use of "herbal" or natural products of this type within [facility].   ACTION TAKEN: The pharmacy department is unable to verify this order at this time and your patient has been informed of this safety policy. Please reevaluate patient's clinical condition at discharge and address if the herbal or natural product(s) should be resumed at that time.

## 2018-04-16 NOTE — H&P (Signed)
History and Physical    Travis Kelly LZJ:[id number] DOB: 12-23-1922 DOA: 04/16/2018  Referring MD/NP/PA:   PCP: [name], MD   Patient coming from:  The patient is coming from home.  At baseline, pt is independent for most of ADL.        Chief Complaint: Palpitation and heart racing  HPI: Travis Kelly is a 85 y.o. male with medical history significant of mitral valve regurgitation, PSVT, BPH, dCHF, CKD 3, who presents with palpitation and heart racing.  Patient states that he started feeling palpitation and heart racing at about 3 PM, which has worsened.  He was found to have tachycardia with heart rate up to 160s.  Patient has shortness of breath, lightheadedness, but denies chest pain.  His shortness breath is intermittent and mild.  No fever or chills.  No cough.  Patient denies nausea, vomiting, diarrhea, abdominal pain, symptoms of UTI or unilateral weakness.  He has bilateral lower leg edema.   Per report, EMS tried to have done vasovagal maneuver without help top his heart rate, but in ED, nurse tried vasovagal maneuvers again, his heart rate converted to sinus rhythm. This was done after pt was give one dose of IV 40 mg Lasix in ED.  ED Course: pt was found to have positive troponin, 0.05, 0.07, 0.13, BNP 1745, slightly worsening renal function, initially heart rate is 165, currently 90s, oxygen saturation 94% on room air, chest x-ray showed cardiomegaly, vascular congestion and a small left pleural effusion.  Patient is placed on telemetry bed for observation.  Review of Systems:   General: no fevers, chills, has fatigue HEENT: no blurry vision, hearing changes or sore throat Respiratory: no dyspnea, coughing, wheezing CV: no chest pain, has palpitations GI: no nausea, vomiting, abdominal pain, diarrhea, constipation GU: no dysuria, burning on urination, increased urinary frequency, hematuria  Ext: has leg edema Neuro: no unilateral weakness, numbness, or tingling, no  vision change or hearing loss Skin: no rash, no skin tear. MSK: No muscle spasm, no deformity, no limitation of range of movement in spin Heme: No easy bruising.  Travel history: No recent long distant travel.  Allergy:  Allergies  Allergen Reactions  . Barbital Rash and Other (See Comments)    Blue lips and swollen tongue    Past Medical History:  Diagnosis Date  . LAFB (left anterior fascicular block)   . Macular degeneration   . Mitral valve disorder    Moderate MR  . Nephrolithiasis 2013  . PSVT (paroxysmal supraventricular tachycardia) ([place]) 06/2016    Past Surgical History:  Procedure Laterality Date  . CATARACT EXTRACTION  04/2011    Social History:  reports that he has never smoked. He has never used smokeless tobacco. He reports current alcohol use. He reports that he does not use drugs.  Family History:  Family History  Problem Relation Age of Onset  . Cancer Mother   . Pneumonia Father      Prior to Admission medications   Medication Sig Start Date End Date Taking? Authorizing Provider  cephALEXin (KEFLEX) 500 MG capsule Take 1 capsule (500 mg total) by mouth 3 (three) times daily. 02/19/18   Lajean Saver, MD  Cholecalciferol (VITAMIN D) 2000 units CAPS Take 2,000 Units by mouth daily.    [provider]  Cyanocobalamin (VITAMIN B 12 PO) Take 5,000 tablets by mouth daily.    [provider]  finasteride (PROSCAR) 5 MG tablet Take 5 mg by mouth daily. 07/25/13  [provider]  furosemide (LASIX) 20 MG tablet TAKE 1 TABLET EVERY DAY 04/03/18   Lorretta Harp, MD  Glucosamine HCl 1000 MG TABS Take 1 tablet by mouth daily.    [provider]  Lutein 6 MG CAPS Take 1 capsule by mouth.    [provider]  meloxicam (MOBIC) 15 MG tablet Take 15 mg by mouth daily as needed for pain. 06/24/13   [provider]  metoprolol tartrate (LOPRESSOR) 25 MG tablet Take 1 tablet (25 mg total) by mouth 3 (three) times  daily. 04/09/18   Erlene Quan, PA-C  Misc Natural Products (TURMERIC CURCUMIN) CAPS Take 1 capsule by mouth daily.    [provider]  Multiple Vitamin (MULTIVITAMIN) tablet Take 1 tablet by mouth daily.    [provider]  Multiple Vitamins-Minerals ([place]) CAPS Take 1 tablet by mouth daily.    [provider]  naproxen (NAPROSYN) 375 MG tablet Take 375 mg by mouth daily as needed for mild pain.    [provider]  RA KRILL OIL 500 MG CAPS Take 500 mg by mouth daily.    [provider]  vitamin C (ASCORBIC ACID) 500 MG tablet Take 500 mg by mouth daily.    [provider]    Physical Exam: Vitals:   04/17/18 0445 04/17/18 0515 04/17/18 0600 04/17/18 0615  BP: 110/71 108/60 113/65 (!) 111/56  Pulse:  (!) 56 (!) 54 (!) 52  Resp: (!) 23 17 16 18   SpO2:  95% 99% 95%  Weight:      Height:       General: Not in acute distress HEENT:       Eyes: PERRL, EOMI, no scleral icterus.       ENT: No discharge from the ears and nose, no pharynx injection, no tonsillar enlargement.        Neck: positive JVD, no bruit, no mass felt. Heme: No neck lymph node enlargement. Cardiac: S1/S2, currently RRR, No murmurs, No gallops or rubs. Respiratory:  No rales, wheezing, rhonchi or rubs. GI: Soft, nondistended, nontender, no rebound pain, no organomegaly, BS present. GU: No hematuria Ext: 2+ pitting leg edema bilaterally. 2+DP/PT pulse bilaterally. Musculoskeletal: No joint deformities, No joint redness or warmth, no limitation of ROM in spin. Skin: No rashes.  Neuro: Alert, oriented X3, cranial nerves II-XII grossly intact, moves all extremities normally. Psych: Patient is not psychotic, no suicidal or hemocidal ideation.  Labs on Admission: I have personally reviewed following labs and imaging studies  CBC: Recent Labs  Lab 04/16/18 1824  WBC 6.5  NEUTROABS 4.6  HGB 11.9*  HCT 37.1*  MCV 101.9*  PLT 580   Basic  Metabolic Panel: Recent Labs  Lab 04/16/18 1824 04/17/18 0050 04/17/18 0254  NA 138  --  141  K 4.1  --  3.9  CL 106  --  106  CO2 23  --  25  GLUCOSE 109*  --  113*  BUN 28*  --  29*  CREATININE 1.20 1.37* 1.34*  CALCIUM 8.7*  --  8.6*  MG 2.1  --   --    GFR: Estimated Creatinine Clearance: 31.1 mL/min (A) (by C-G formula based on SCr of 1.34 mg/dL (H)). Liver Function Tests: Recent Labs  Lab 04/16/18 1824  AST 78*  ALT 72*  ALKPHOS 73  BILITOT 0.8  PROT 6.9  ALBUMIN 3.6   No results for input(s): LIPASE, AMYLASE in the last 168 hours.  No results for input(s): AMMONIA in the last 168 hours. Coagulation Profile: No results for input(s): INR, PROTIME in the last 168 hours. Cardiac Enzymes: Recent Labs  Lab 04/16/18 1824 04/17/18 0050 04/17/18 0254  TROPONINI 0.05* 0.07* 0.13*   BNP (last 3 results) No results for input(s): PROBNP in the last 8760 hours. HbA1C: Recent Labs    04/17/18 0254  HGBA1C 5.3   CBG: No results for input(s): GLUCAP in the last 168 hours. Lipid Profile: Recent Labs    04/17/18 0254  CHOL 141  HDL 53  LDLCALC 80  TRIG 41  CHOLHDL 2.7   Thyroid Function Tests: Recent Labs    04/17/18 0254  TSH 3.997   Anemia Panel: No results for input(s): VITAMINB12, FOLATE, FERRITIN, TIBC, IRON, RETICCTPCT in the last 72 hours. Urine analysis:    Component Value Date/Time   COLORURINE AMBER (A) 02/19/2018 1548   APPEARANCEUR HAZY (A) 02/19/2018 1548   LABSPEC 1.019 02/19/2018 1548   PHURINE 5.0 02/19/2018 1548   GLUCOSEU NEGATIVE 02/19/2018 1548   HGBUR NEGATIVE 02/19/2018 [place] 02/19/2018 1548   KETONESUR NEGATIVE 02/19/2018 1548   PROTEINUR NEGATIVE 02/19/2018 1548   NITRITE NEGATIVE 02/19/2018 1548   LEUKOCYTESUR TRACE (A) 02/19/2018 1548   Sepsis Labs: @LABRCNTIP (procalcitonin:4,lacticidven:4) )No results found for this or any previous visit (from the past 240 hour(s)).   Radiological Exams on  Admission: Dg Chest Port 1 View  Result Date: 04/16/2018 CLINICAL DATA:  Supraventricular tachycardia.  No chest pain. EXAM: PORTABLE CHEST 1 VIEW COMPARISON:  CT 07/05/2016.  Radiographs 07/04/2016. FINDINGS: 1734 hours. There are lower lung volumes with mild bibasilar atelectasis and a probable small left pleural effusion. There is vascular congestion without overt pulmonary edema. Cardiomegaly and aortic atherosclerosis are stable. There is no pneumothorax or confluent airspace opacity. No acute osseous findings are evident. Telemetry leads overlie the chest. IMPRESSION: Cardiomegaly, vascular congestion and small left pleural effusion. No overt pulmonary edema. Electronically Signed   By: Richardean Sale M.D.   On: 04/16/2018 17:54     EKG: Independently reviewed. SVT, QTC 540, LAD, poor R wave progression.  Assessment/Plan Principal Problem:   Acute on chronic diastolic CHF (congestive heart failure) (HCC) Active Problems:   Elevated troponin   Chronic renal insufficiency, stage 3 (moderate) (HCC)   BPH (benign prostatic hyperplasia)   PSVT (paroxysmal supraventricular tachycardia) (HCC)   Abnormal liver function   Acute on chronic diastolic CHF (congestive heart failure) [facility]): Patient has positive JVD, bilateral 2+ leg edema, elevated BNP 1745, plus vascular congestion on chest x-ray, consistent with CHF exacerbation.  -will place on tele bed for obs -Lasix 40 mg bid by IV -2d echo -Daily weights -strict I/O's -Low salt diet  Elevatd troponin: trop is up to 0.13 so far. No CP.  Most likely due to demand ischemia secondary to CHF exacerbation and SVT. - cycle CE q6 x3 and repeat EKG in the am  - prn Nitroglycerin and aspirin - Risk factor stratification: will check FLP and A1C  - f/u 2d echo  Hx of PSVT (paroxysmal supraventricular tachycardia) ([place]): pt initially has SVT with HR up to 160s. It has converted to sinus rhythm by vasovagal maneuver.  Patient was also treated with  IV labetalol in ED. -Continue metoprolol  Chronic renal insufficiency, stage 3 (moderate) ([place]): Slightly worsening than baseline.  Baseline creatinine 1.0-1.1.  His creatinine is 1.20, BUN 28. -Follow-up renal function by BMP -Hold Mobic and naproxen  BPH (benign prostatic hyperplasia): -  Proscar  Abnormal liver function: ALP 73, AST 78, ALT 72, total bilirubin 0.8, likely due to liver congestion secondary to CHF exacerbation. -Avoid liver toxic medication, such as Tylenol -Check hepatitis panel   DVT ppx: SQ Lovenox Code Status: Full code Family Communication:  Yes, patient's son-in-law and daughter   at bed side Disposition Plan:  Anticipate discharge back to previous home environment Consults called: None Admission status: Obs / tele     Date of Service 04/17/2018    [place] [facility] Pager [phone]  If 7PM-7AM, please contact night-coverage [email or web address] Password [facility] 04/17/2018, 6:59 AM

## 2018-04-17 ENCOUNTER — Observation Stay (HOSPITAL_BASED_OUTPATIENT_CLINIC_OR_DEPARTMENT_OTHER): Payer: No Typology Code available for payment source

## 2018-04-17 ENCOUNTER — Encounter (HOSPITAL_COMMUNITY): Payer: Self-pay | Admitting: General Practice

## 2018-04-17 DIAGNOSIS — I340 Nonrheumatic mitral (valve) insufficiency: Secondary | ICD-10-CM | POA: Diagnosis not present

## 2018-04-17 DIAGNOSIS — I370 Nonrheumatic pulmonary valve stenosis: Secondary | ICD-10-CM | POA: Diagnosis not present

## 2018-04-17 DIAGNOSIS — I361 Nonrheumatic tricuspid (valve) insufficiency: Secondary | ICD-10-CM

## 2018-04-17 DIAGNOSIS — R945 Abnormal results of liver function studies: Secondary | ICD-10-CM | POA: Diagnosis present

## 2018-04-17 LAB — LIPID PANEL
Cholesterol: 141 mg/dL (ref 0–200)
HDL: 53 mg/dL (ref >40)
LDL Cholesterol: 80 mg/dL (ref 0–99)
Total CHOL/HDL Ratio: 2.7 RATIO
Triglycerides: 41 mg/dL (ref <150)
VLDL: 8 mg/dL (ref 0–40)

## 2018-04-17 LAB — BASIC METABOLIC PANEL
Anion gap: 10 (ref 5–15)
BUN: 29 mg/dL — ABNORMAL HIGH (ref 8–23)
CO2: 25 mmol/L (ref 22–32)
Calcium: 8.6 mg/dL — ABNORMAL LOW (ref 8.9–10.3)
Chloride: 106 mmol/L (ref 98–111)
Creatinine, Ser: 1.34 mg/dL — ABNORMAL HIGH (ref 0.61–1.24)
GFR calc Af Amer: 53 mL/min — ABNORMAL LOW (ref >60)
GFR calc non Af Amer: 45 mL/min — ABNORMAL LOW (ref >60)
Glucose, Bld: 113 mg/dL — ABNORMAL HIGH (ref 70–99)
Potassium: 3.9 mmol/L (ref 3.5–5.1)
Sodium: 141 mmol/L (ref 135–145)

## 2018-04-17 LAB — HEMOGLOBIN A1C
Hgb A1c MFr Bld: 5.3 % (ref 4.8–5.6)
Mean Plasma Glucose: 105.41 mg/dL

## 2018-04-17 LAB — CREATININE, SERUM
Creatinine, Ser: 1.37 mg/dL — ABNORMAL HIGH (ref 0.61–1.24)
GFR calc Af Amer: 51 mL/min — ABNORMAL LOW (ref >60)
GFR calc non Af Amer: 44 mL/min — ABNORMAL LOW (ref >60)

## 2018-04-17 LAB — TSH: TSH: 3.997 u[IU]/mL (ref 0.350–4.500)

## 2018-04-17 LAB — TROPONIN I
Troponin I: 0.07 ng/mL (ref <0.03)
Troponin I: 0.13 ng/mL (ref <0.03)
Troponin I: 0.28 ng/mL (ref <0.03)

## 2018-04-17 LAB — ECHOCARDIOGRAM COMPLETE
Height: 66 in
Weight: 2640 oz

## 2018-04-17 MED ORDER — FLECAINIDE ACETATE 50 MG PO TABS
50.0000 mg | ORAL_TABLET | Freq: Two times a day (BID) | ORAL | Status: DC
  Administered 2018-04-17 – 2018-04-20 (×5): 50 mg via ORAL
  Filled 2018-04-17 (×7): qty 1

## 2018-04-17 MED ORDER — CYCLOBENZAPRINE HCL 5 MG PO TABS
7.5000 mg | ORAL_TABLET | Freq: Three times a day (TID) | ORAL | Status: DC | PRN
  Administered 2018-04-17 (×2): 7.5 mg via ORAL
  Filled 2018-04-17: qty 1.5
  Filled 2018-04-17: qty 2

## 2018-04-17 MED ORDER — TRAMADOL HCL 50 MG PO TABS
100.0000 mg | ORAL_TABLET | Freq: Four times a day (QID) | ORAL | Status: DC | PRN
  Administered 2018-04-17 – 2018-04-18 (×4): 100 mg via ORAL
  Filled 2018-04-17 (×4): qty 2

## 2018-04-17 MED ORDER — PERFLUTREN LIPID MICROSPHERE
INTRAVENOUS | Status: AC
  Administered 2018-04-17: 2 mL via INTRAVENOUS
  Filled 2018-04-17: qty 10

## 2018-04-17 MED ORDER — ACETAMINOPHEN 325 MG PO TABS
650.0000 mg | ORAL_TABLET | ORAL | Status: DC | PRN
  Administered 2018-04-17 – 2018-04-18 (×3): 650 mg via ORAL
  Filled 2018-04-17 (×3): qty 2

## 2018-04-17 MED ORDER — METOPROLOL TARTRATE 25 MG PO TABS
25.0000 mg | ORAL_TABLET | Freq: Two times a day (BID) | ORAL | Status: DC
  Administered 2018-04-17 – 2018-04-20 (×5): 25 mg via ORAL
  Filled 2018-04-17 (×5): qty 1

## 2018-04-17 MED ORDER — PERFLUTREN LIPID MICROSPHERE
1.0000 mL | INTRAVENOUS | Status: AC | PRN
  Administered 2018-04-17: 2 mL via INTRAVENOUS
  Filled 2018-04-17: qty 10

## 2018-04-17 NOTE — ED Notes (Signed)
Pt converted into a sinus rhythm via vasovagal maneuvers. Adenosine not given.

## 2018-04-17 NOTE — Consult Note (Addendum)
Cardiology Consultation:   Patient ID: Raun Routh Piano MRN: [id number]; DOB: 1922-10-18  Admit date: 04/16/2018 Date of Consult: 04/17/2018  Primary Care Provider: [name], MD Primary Cardiologist: Dr. Gwenlyn Found Primary Electrophysiologist:  None    Patient Profile:   Georgie Haque Krasner is a 85 y.o. male with a hx of macular degeneration, VHD w/ MR (notes report the patient does not want intervened on, described as at least moderate, may be severe), BPH, CKD (III)and PSVT historically has been responsive to vagal maneuvers who is being seen today for the evaluation of recurrent SVT at the request of Dr. Karleen Hampshire.  History of Present Illness:   Mr. Berger was most recently seen by Oswaldo Done, NP 04/09/18, an add on visit with tachycardia that had started that morning (with reports of a couple self terminated episodes over the prior weekend).  He took his usual home metoprolol 25mg  AM dose and called for an appt when it persisted for a few hours.  He was in his SVT that responded to carotid massage, his metoprolol was increased from 25mg  BID to TID.  They discussed EP referral, the patient wanted to think about it.  They made an appt to see Dr. Lovena Le late Jan, with the plan that if the pt decided not to pursue it he would call and cancel.  He was admitted last night with recurrent tachy-palpitations, associated with some SOB and lightheadedness, no CP.  Vagal maneuvers by EMS were unsuccessful.  Though worked in the ER converting him to [place], he was also given a dose of IV lasix for vascular congestion on his CXR.  He is aware of something "different" in his chest when he has tachycardia, the longer it lasts the weaker he feels, though denies any overt dizziness,lightheadedness, no near syncope or syncope.  He has some awareness of palpitations as well.  No CP with the SVT otherwise.  He was winded yesterday, onset was about 1500, stopped about 2300, remains fatigued today though feels better overall then  last evening.  He reports himself as fairly active, [place].  LABS K+ 3.9 Mag 2.1 BUN/Creat 28/1.20 > 29/1.34 Trop I:  0.05, 0.07, 0.13, 0.28 BNP 1745.6 WBC 6.5 H/H 11/37 Plts 248  TSH 3.997  Past Medical History:  Diagnosis Date  . LAFB (left anterior fascicular block)   . Macular degeneration   . Mitral valve disorder    Moderate MR  . Nephrolithiasis 2013  . PSVT (paroxysmal supraventricular tachycardia) ([place]) 06/2016    Past Surgical History:  Procedure Laterality Date  . CATARACT EXTRACTION  04/2011     Home Medications:  Prior to Admission medications   Medication Sig Start Date End Date Taking? Authorizing Provider  acetaminophen (TYLENOL) 500 MG tablet Take 1,000 mg by mouth daily as needed for mild pain.   Yes [provider]  Cholecalciferol (VITAMIN D) 2000 units CAPS Take 2,000 Units by mouth daily.   Yes [provider]  Cyanocobalamin (VITAMIN B 12 PO) Take 5,000 mg by mouth daily.    Yes [provider]  finasteride (PROSCAR) 5 MG tablet Take 5 mg by mouth daily. 07/25/13  Yes [provider]  fluticasone (FLONASE) 50 MCG/ACT nasal spray Place 1-2 sprays into both nostrils daily as needed for allergies or rhinitis.   Yes [provider]  furosemide (LASIX) 20 MG tablet TAKE 1 TABLET EVERY DAY Patient taking differently: Take 20 mg by mouth daily.  04/03/18  Yes  Lorretta Harp, MD  Glucosamine HCl 1000 MG TABS Take 1 tablet by mouth daily.   Yes [provider]  hydroxypropyl methylcellulose / hypromellose (ISOPTO TEARS / GONIOVISC) 2.5 % ophthalmic solution Place 1 drop into both eyes 2 (two) times daily.   Yes [provider]  Lutein 6 MG CAPS Take 1 capsule by mouth daily.    Yes [provider]  meloxicam (MOBIC) 15 MG tablet Take 15 mg by mouth daily as needed for pain. 06/24/13  Yes [provider]  metoprolol tartrate  (LOPRESSOR) 25 MG tablet Take 1 tablet (25 mg total) by mouth 3 (three) times daily. 04/09/18  Yes Kilroy, Lurena Joiner K, PA-C  Misc Natural Products (TURMERIC CURCUMIN) CAPS Take 1 capsule by mouth daily.   Yes [provider]  Multiple Minerals-Vitamins (CALCIUM-MAGNESIUM-ZINC-D3 PO) Take 1 tablet by mouth daily.   Yes [provider]  Multiple Vitamin (MULTIVITAMIN) tablet Take 1 tablet by mouth daily.   Yes [provider]  Multiple Vitamins-Minerals (Reeder) CAPS Take 1 tablet by mouth daily.   Yes [provider]  naproxen (NAPROSYN) 375 MG tablet Take 375 mg by mouth daily as needed for mild pain.   Yes [provider]  OVER THE COUNTER MEDICATION 1 Container by Mouth Rinse route 2 (two) times daily. [employer] Peroxyl   Yes [provider]  RA KRILL OIL 500 MG CAPS Take 500 mg by mouth daily.   Yes [provider]  vitamin C (ASCORBIC ACID) 500 MG tablet Take 500 mg by mouth daily.   Yes [provider]    Inpatient Medications: Scheduled Meds: . perflutren lipid microspheres (DEFINITY) IV suspension      . aspirin EC  81 mg Oral Daily  . calcium-vitamin D  1 tablet Oral Q breakfast  . cholecalciferol  2,000 Units Oral Daily  . enoxaparin (LOVENOX) injection  40 mg Subcutaneous Daily  . finasteride  5 mg Oral Daily  . furosemide  40 mg Intravenous Q12H  . metoprolol tartrate  25 mg Oral Q8H  . omega-3 acid ethyl esters  1,000 mg Oral Daily  . sodium chloride flush  3 mL Intravenous Q12H  . vitamin B-12  1,000 mcg Oral Daily  . vitamin C  500 mg Oral Daily   Continuous Infusions: . sodium chloride     PRN Meds: sodium chloride, cyclobenzaprine, fluticasone, metoprolol tartrate, nitroGLYCERIN, ondansetron (ZOFRAN) IV, sodium chloride flush, zolpidem  Allergies:    Allergies  Allergen Reactions  . Barbital Rash and Other (See Comments)    Blue lips and swollen tongue    Social History:     Social History   Socioeconomic History  . Marital status: Married    Spouse name: Not on file  . Number of children: Not on file  . Years of education: Not on file  . Highest education level: Not on file  Occupational History  . Not on file  Social Needs  . Financial resource strain: Not on file  . Food insecurity:    Worry: Not on file    Inability: Not on file  . Transportation needs:    Medical: Not on file    Non-medical: Not on file  Tobacco Use  . Smoking status: Never Smoker  . Smokeless tobacco: Never Used  Substance and Sexual Activity  . Alcohol use: Yes    Comment: occ wine  . Drug use: Never  . Sexual activity: Not on file  Lifestyle  .  Physical activity:    Days per week: Not on file    Minutes per session: Not on file  . Stress: Not on file  Relationships  . Social connections:    Talks on phone: Not on file    Gets together: Not on file    Attends religious service: Not on file    Active member of club or organization: Not on file    Attends meetings of clubs or organizations: Not on file    Relationship status: Not on file  . Intimate partner violence:    Fear of current or ex partner: Not on file    Emotionally abused: Not on file    Physically abused: Not on file    Forced sexual activity: Not on file  Other Topics Concern  . Not on file  Social History Narrative  . Not on file    Family History:   Family History  Problem Relation Age of Onset  . Cancer Mother   . Pneumonia Father      ROS:  Please see the history of present illness.  All other ROS reviewed and negative.     Physical Exam/Data:   Vitals:   04/17/18 0615 04/17/18 0715 04/17/18 0815 04/17/18 0930  BP: (!) 111/56 127/80 (!) 106/55 (!) 117/56  Pulse: (!) 52 (!) 55 (!) 50 (!) 58  Resp: 18 19 17    Temp:    (!) 97.3 F (36.3 C)  TempSrc:    Oral  SpO2: 95% 94% 93% 99%  Weight:    74.8 kg  Height:    5\' 6"  (1.676 m)    Intake/Output Summary (Last 24 hours) at  04/17/2018 1137 Last data filed at 04/17/2018 1100 Gross per 24 hour  Intake 150 ml  Output 275 ml  Net -125 ml   Filed Weights   04/16/18 1825 04/17/18 0930  Weight: 72.6 kg 74.8 kg   Body mass index is 26.63 kg/m.  General:  Well nourished, well developed, in no acute distress HEENT: normal Lymph: no adenopathy Neck: no JVD Endocrine:  No thryomegaly Vascular: No carotid bruits  Cardiac:  RRR; 3/6SM, radiates towards axilla Lungs:  Slightly diminished at the bases b/l, no wheezing, rhonchi or rales  Abd: soft, nontender Ext: trace-1+ edema Musculoskeletal:  No deformities,age appropriate atrophy Skin: warm and dry  Neuro:  No gross focal abnormalities noted Psych:  Normal affect   EKG:  The EKG was personally reviewed and demonstrates:   #1 SVT 148bpm, same morphology of his SR #2 SR 66bpm, LAD,  no ischemic looking changes  Telemetry:  Telemetry was personally reviewed and demonstrates:   SR  Relevant CV Studies:  07/05/16: TTE Study Conclusions - Left ventricle: The cavity size was normal. Wall thickness was   increased in a pattern of mild LVH. Systolic function was normal.   The estimated ejection fraction was in the range of 55% to 60%.   Wall motion was normal; there were no regional wall motion   abnormalities. The study is not technically sufficient to allow   evaluation of LV diastolic function. - Aortic valve: There was mild regurgitation. - Mitral valve: Calcified annulus. Moderate prolapse, involving the   posterior leaflet. There was moderate regurgitation. - Left atrium: The atrium was moderately dilated. - Pulmonary arteries: Systolic pressure was mildly increased. PA   peak pressure: 35 mm Hg (S). Impressions: - Normal LV function; mild LVH; calcified aortic valve with mild   AI: prolapse of posterior MV leaflet  with at least moderate MR   and may be severe (suggest TEE to further assess); moderate LAE (3mm measured);   trace TR with mildly  elevated pulmonary pressure  Laboratory Data:  Chemistry Recent Labs  Lab 04/16/18 1824 04/17/18 0050 04/17/18 0254  NA 138  --  141  K 4.1  --  3.9  CL 106  --  106  CO2 23  --  25  GLUCOSE 109*  --  113*  BUN 28*  --  29*  CREATININE 1.20 1.37* 1.34*  CALCIUM 8.7*  --  8.6*  GFRNONAA 52* 44* 45*  GFRAA >60 51* 53*  ANIONGAP 9  --  10    Recent Labs  Lab 04/16/18 1824  PROT 6.9  ALBUMIN 3.6  AST 78*  ALT 72*  ALKPHOS 73  BILITOT 0.8   Hematology Recent Labs  Lab 04/16/18 1824  WBC 6.5  RBC 3.64*  HGB 11.9*  HCT 37.1*  MCV 101.9*  MCH 32.7  MCHC 32.1  RDW 14.1  PLT 248   Cardiac Enzymes Recent Labs  Lab 04/16/18 1824 04/17/18 0050 04/17/18 0254 04/17/18 0856  TROPONINI 0.05* 0.07* 0.13* 0.28*   No results for input(s): TROPIPOC in the last 168 hours.  BNP Recent Labs  Lab 04/16/18 1824  BNP 1,745.6*    DDimer No results for input(s): DDIMER in the last 168 hours.  Radiology/Studies:   Dg Chest Port 1 View Result Date: 04/16/2018 CLINICAL DATA:  Supraventricular tachycardia.  No chest pain. EXAM: PORTABLE CHEST 1 VIEW COMPARISON:  CT 07/05/2016.  Radiographs 07/04/2016. FINDINGS: 1734 hours. There are lower lung volumes with mild bibasilar atelectasis and a probable small left pleural effusion. There is vascular congestion without overt pulmonary edema. Cardiomegaly and aortic atherosclerosis are stable. There is no pneumothorax or confluent airspace opacity. No acute osseous findings are evident. Telemetry leads overlie the chest. IMPRESSION: Cardiomegaly, vascular congestion and small left pleural effusion. No overt pulmonary edema. Electronically Signed   By: Richardean Sale M.D.   On: 04/16/2018 17:54    Assessment and Plan:   1. PSVT, seems to be increasing in frequency, harder to stop     Difficult to tell, looks like short RP tachycardia  The patient would not be against the idea of an ablation and willing to discuss with Dr. Rayann Heman. I  am not certain AAD for him is an option.  A repeat echo is completed, pending read.  This may help guide discussion and management  2. CHF     Getting IV lasix, follow      No rest SOB today, + edema  3. Abnormal Trop     Trending upwards, no anginal symptoms, no ischemic EKG changes appreciated      Likely demand ischemia  4. At least Mod MR by his echo in 2018     In review of notes, he has not wanted further evaluation or intervention     Significant murmur on exam, he confirms he would not be interested in any intervention     Likely part of his HF now, and chronically         For questions or updates, please contact [place] Please consult [email or web address] for contact info under     Signed, Baldwin Jamaica, PA-C  04/17/2018 11:37 AM   I have seen, examined the patient, and reviewed the above assessment and plan.  Changes to above are made where necessary.  On exam, RRR.  Pt with adenosine sensitive  short RP SVT, not improved with metoprolol. Therapeutic strategies for supraventricular tachycardia including medicine and ablation were discussed in detail with the patient today. Risk, benefits, and alternatives to EP study and radiofrequency ablation were also discussed in detail today.  He and his family are clear that they would like to avoid ablation.  We discussed flecainide as an option.  He would like to proceed with this medicine. Start flecainide 50mg  BID Reduce metoprolol to 25mg  BID Continue overnight diuresis Anticipate possible discharge in am.  Minor elevation of troponin is also noted.  No further inpatient workup is planned.  Given his advanced age, he and his family would prefer conservative measures.  Follow-up with Dr Gwenlyn Found in 1 week with an ekg as scheduled I will see again in 4-6 weeks  Co Sign: Thompson Grayer, MD

## 2018-04-17 NOTE — Progress Notes (Signed)
PROGRESS NOTE    Travis Kelly  KYH:[id number] DOB: 04-19-1922 DOA: 04/16/2018 PCP: [name], MD    Brief Narrative: Travis Kelly is a 85 y.o. male with medical history significant of mitral valve regurgitation, PSVT, BPH, dCHF, CKD 3, who presents with palpitation and heart racing.. he was found to have PSVT, once last week and once yesterday both resolved with vagal maneuvers. He was also found to be in acute CHF.   Assessment & Plan:   Principal Problem:   Acute on chronic diastolic CHF (congestive heart failure) (HCC) Active Problems:   Elevated troponin   Chronic renal insufficiency, stage 3 (moderate) (HCC)   BPH (benign prostatic hyperplasia)   PSVT (paroxysmal supraventricular tachycardia) (HCC)   Abnormal liver function  Acute on chronic diastolic CHF (congestive heart failure)  Improving, no chest pain or sob earlier today.  Started diuresis with IV lasix 40 mg BID.  Cardiology consulted in ED for further recommendations.     Paroxysmal SVT: Resolved with vagal maneuver.  Asymptomatic at this time.  On 25 mg of metoprolol TID   H/o of STAGE 3 CKD - watch his renal parameters on IV lasix.    Mild elevation in transaminases probably from liver congestion from acute CHF.  Monitor prn.  No nausea, vomiting or abdominal pain.    Elevated troponin's possibly from demand ischemia from CHF and PSVT.    DVT prophylaxis:  Code Status: full code.  Family Communication: daughter at bedside.  Disposition Plan: pending clinical improvement.   Consultants:   EP consult.    Procedures: None   Antimicrobials: NOne.    Subjective: No chest pain , breathing has improved.  No nausea, vomiting or abdominal pain.  Reports worsening leg cramps from last night.   Objective: Vitals:   04/17/18 0615 04/17/18 0715 04/17/18 0815 04/17/18 0930  BP: (!) 111/56 127/80 (!) 106/55 (!) 117/56  Pulse: (!) 52 (!) 55 (!) 50 (!) 58  Resp: 18 19 17    Temp:    (!)  97.3 F (36.3 C)  TempSrc:    Oral  SpO2: 95% 94% 93% 99%  Weight:    74.8 kg  Height:    5\' 6"  (1.676 m)    Intake/Output Summary (Last 24 hours) at 04/17/2018 1057 Last data filed at 04/17/2018 0930 Gross per 24 hour  Intake -  Output 275 ml  Net -275 ml   Filed Weights   04/16/18 1825 04/17/18 0930  Weight: 72.6 kg 74.8 kg    Examination:  General exam: mild distress from leg cramps, . Respiratory system: Diminished at bases, no wheezing heard.  Cardiovascular system: S1 & S2 heard, RRR. , murmer present.  Gastrointestinal system: Abdomen is soft non tender non distended bowel sounds good.  Central nervous system: Alert and oriented. No focal neurological deficits. Extremities: trace edema.  Skin: No rashes, lesions or ulcers Psychiatry:  Mood & affect appropriate.     Data Reviewed: I have personally reviewed following labs and imaging studies  CBC: Recent Labs  Lab 04/16/18 1824  WBC 6.5  NEUTROABS 4.6  HGB 11.9*  HCT 37.1*  MCV 101.9*  PLT 151   Basic Metabolic Panel: Recent Labs  Lab 04/16/18 1824 04/17/18 0050 04/17/18 0254  NA 138  --  141  K 4.1  --  3.9  CL 106  --  106  CO2 23  --  25  GLUCOSE 109*  --  113*  BUN 28*  --  29*  CREATININE 1.20 1.37* 1.34*  CALCIUM 8.7*  --  8.6*  MG 2.1  --   --    GFR: Estimated Creatinine Clearance: 31.1 mL/min (A) (by C-G formula based on SCr of 1.34 mg/dL (H)). Liver Function Tests: Recent Labs  Lab 04/16/18 1824  AST 78*  ALT 72*  ALKPHOS 73  BILITOT 0.8  PROT 6.9  ALBUMIN 3.6   No results for input(s): LIPASE, AMYLASE in the last 168 hours. No results for input(s): AMMONIA in the last 168 hours. Coagulation Profile: No results for input(s): INR, PROTIME in the last 168 hours. Cardiac Enzymes: Recent Labs  Lab 04/16/18 1824 04/17/18 0050 04/17/18 0254 04/17/18 0856  TROPONINI 0.05* 0.07* 0.13* 0.28*   BNP (last 3 results) No results for input(s): PROBNP in the last 8760  hours. HbA1C: Recent Labs    04/17/18 0254  HGBA1C 5.3   CBG: No results for input(s): GLUCAP in the last 168 hours. Lipid Profile: Recent Labs    04/17/18 0254  CHOL 141  HDL 53  LDLCALC 80  TRIG 41  CHOLHDL 2.7   Thyroid Function Tests: Recent Labs    04/17/18 0254  TSH 3.997   Anemia Panel: No results for input(s): VITAMINB12, FOLATE, FERRITIN, TIBC, IRON, RETICCTPCT in the last 72 hours. Sepsis Labs: No results for input(s): PROCALCITON, LATICACIDVEN in the last 168 hours.  No results found for this or any previous visit (from the past 240 hour(s)).       Radiology Studies: Dg Chest Port 1 View  Result Date: 04/16/2018 CLINICAL DATA:  Supraventricular tachycardia.  No chest pain. EXAM: PORTABLE CHEST 1 VIEW COMPARISON:  CT 07/05/2016.  Radiographs 07/04/2016. FINDINGS: 1734 hours. There are lower lung volumes with mild bibasilar atelectasis and a probable small left pleural effusion. There is vascular congestion without overt pulmonary edema. Cardiomegaly and aortic atherosclerosis are stable. There is no pneumothorax or confluent airspace opacity. No acute osseous findings are evident. Telemetry leads overlie the chest. IMPRESSION: Cardiomegaly, vascular congestion and small left pleural effusion. No overt pulmonary edema. Electronically Signed   By: Richardean Sale M.D.   On: 04/16/2018 17:54        Scheduled Meds: . aspirin EC  81 mg Oral Daily  . calcium-vitamin D  1 tablet Oral Q breakfast  . cholecalciferol  2,000 Units Oral Daily  . enoxaparin (LOVENOX) injection  40 mg Subcutaneous Daily  . finasteride  5 mg Oral Daily  . furosemide  40 mg Intravenous Q12H  . metoprolol tartrate  25 mg Oral Q8H  . omega-3 acid ethyl esters  1,000 mg Oral Daily  . sodium chloride flush  3 mL Intravenous Q12H  . vitamin B-12  1,000 mcg Oral Daily  . vitamin C  500 mg Oral Daily   Continuous Infusions: . sodium chloride       LOS: 0 days    Time spent: 32  MINUTES.     Hosie Poisson, MD [facility] Pager [phone] If 7PM-7AM, please contact night-coverage [email or web address] Password [facility] 04/17/2018, 10:57 AM

## 2018-04-17 NOTE — Progress Notes (Signed)
  Echocardiogram 2D Echocardiogram has been performed.  [name] M 04/17/2018, 11:57 AM

## 2018-04-17 NOTE — ED Notes (Signed)
bfast tray ordered 

## 2018-04-18 ENCOUNTER — Observation Stay (HOSPITAL_COMMUNITY): Payer: No Typology Code available for payment source

## 2018-04-18 DIAGNOSIS — M11261 Other chondrocalcinosis, right knee: Secondary | ICD-10-CM | POA: Diagnosis present

## 2018-04-18 DIAGNOSIS — Z960 Presence of urogenital implants: Secondary | ICD-10-CM | POA: Diagnosis present

## 2018-04-18 DIAGNOSIS — I480 Paroxysmal atrial fibrillation: Secondary | ICD-10-CM | POA: Diagnosis present

## 2018-04-18 DIAGNOSIS — I5032 Chronic diastolic (congestive) heart failure: Secondary | ICD-10-CM | POA: Diagnosis present

## 2018-04-18 DIAGNOSIS — E785 Hyperlipidemia, unspecified: Secondary | ICD-10-CM | POA: Diagnosis present

## 2018-04-18 DIAGNOSIS — Z9181 History of falling: Secondary | ICD-10-CM | POA: Diagnosis not present

## 2018-04-18 DIAGNOSIS — I5033 Acute on chronic diastolic (congestive) heart failure: Secondary | ICD-10-CM | POA: Diagnosis present

## 2018-04-18 DIAGNOSIS — N183 Chronic kidney disease, stage 3 (moderate): Secondary | ICD-10-CM | POA: Diagnosis present

## 2018-04-18 DIAGNOSIS — Z9842 Cataract extraction status, left eye: Secondary | ICD-10-CM | POA: Diagnosis not present

## 2018-04-18 DIAGNOSIS — Z87891 Personal history of nicotine dependence: Secondary | ICD-10-CM | POA: Diagnosis not present

## 2018-04-18 DIAGNOSIS — N400 Enlarged prostate without lower urinary tract symptoms: Secondary | ICD-10-CM | POA: Diagnosis present

## 2018-04-18 DIAGNOSIS — H353210 Exudative age-related macular degeneration, right eye, stage unspecified: Secondary | ICD-10-CM | POA: Diagnosis present

## 2018-04-18 DIAGNOSIS — K219 Gastro-esophageal reflux disease without esophagitis: Secondary | ICD-10-CM | POA: Diagnosis present

## 2018-04-18 DIAGNOSIS — I080 Rheumatic disorders of both mitral and aortic valves: Secondary | ICD-10-CM | POA: Diagnosis present

## 2018-04-18 DIAGNOSIS — M1711 Unilateral primary osteoarthritis, right knee: Secondary | ICD-10-CM | POA: Diagnosis present

## 2018-04-18 DIAGNOSIS — I471 Supraventricular tachycardia: Secondary | ICD-10-CM

## 2018-04-18 DIAGNOSIS — I959 Hypotension, unspecified: Secondary | ICD-10-CM | POA: Diagnosis not present

## 2018-04-18 DIAGNOSIS — M19012 Primary osteoarthritis, left shoulder: Secondary | ICD-10-CM | POA: Diagnosis present

## 2018-04-18 DIAGNOSIS — Z8711 Personal history of peptic ulcer disease: Secondary | ICD-10-CM | POA: Diagnosis not present

## 2018-04-18 DIAGNOSIS — M009 Pyogenic arthritis, unspecified: Secondary | ICD-10-CM | POA: Diagnosis not present

## 2018-04-18 DIAGNOSIS — R002 Palpitations: Secondary | ICD-10-CM | POA: Diagnosis present

## 2018-04-18 DIAGNOSIS — I5043 Acute on chronic combined systolic (congestive) and diastolic (congestive) heart failure: Secondary | ICD-10-CM

## 2018-04-18 DIAGNOSIS — I444 Left anterior fascicular block: Secondary | ICD-10-CM | POA: Diagnosis present

## 2018-04-18 DIAGNOSIS — N179 Acute kidney failure, unspecified: Secondary | ICD-10-CM | POA: Diagnosis not present

## 2018-04-18 DIAGNOSIS — I5042 Chronic combined systolic (congestive) and diastolic (congestive) heart failure: Secondary | ICD-10-CM | POA: Diagnosis present

## 2018-04-18 DIAGNOSIS — E860 Dehydration: Secondary | ICD-10-CM | POA: Diagnosis not present

## 2018-04-18 DIAGNOSIS — M19011 Primary osteoarthritis, right shoulder: Secondary | ICD-10-CM | POA: Diagnosis present

## 2018-04-18 DIAGNOSIS — H353120 Nonexudative age-related macular degeneration, left eye, stage unspecified: Secondary | ICD-10-CM | POA: Diagnosis present

## 2018-04-18 DIAGNOSIS — Z87442 Personal history of urinary calculi: Secondary | ICD-10-CM | POA: Diagnosis not present

## 2018-04-18 LAB — SYNOVIAL CELL COUNT + DIFF, W/ CRYSTALS
Eosinophils-Synovial: 0 % (ref 0–1)
Lymphocytes-Synovial Fld: 3 % (ref 0–20)
Monocyte-Macrophage-Synovial Fluid: 6 % — ABNORMAL LOW (ref 50–90)
Neutrophil, Synovial: 91 % — ABNORMAL HIGH (ref 0–25)
WBC, Synovial: 88500 /mm3 — ABNORMAL HIGH (ref 0–200)

## 2018-04-18 LAB — CBC
HCT: 35.9 % — ABNORMAL LOW (ref 39.0–52.0)
Hemoglobin: 11.7 g/dL — ABNORMAL LOW (ref 13.0–17.0)
MCH: 32.6 pg (ref 26.0–34.0)
MCHC: 32.6 g/dL (ref 30.0–36.0)
MCV: 100 fL (ref 80.0–100.0)
Platelets: 237 10*3/uL (ref 150–400)
RBC: 3.59 MIL/uL — ABNORMAL LOW (ref 4.22–5.81)
RDW: 14.1 % (ref 11.5–15.5)
WBC: 10.6 10*3/uL — ABNORMAL HIGH (ref 4.0–10.5)
nRBC: 0 % (ref 0.0–0.2)

## 2018-04-18 LAB — HEPATITIS PANEL, ACUTE
HCV Ab: <0.1 s/co ratio (ref 0.0–0.9)
Hep A IgM: NEGATIVE
Hep B C IgM: NEGATIVE
Hepatitis B Surface Ag: NEGATIVE

## 2018-04-18 LAB — BASIC METABOLIC PANEL
Anion gap: 11 (ref 5–15)
BUN: 28 mg/dL — AB (ref 8–23)
CO2: 32 mmol/L (ref 22–32)
Calcium: 8.7 mg/dL — ABNORMAL LOW (ref 8.9–10.3)
Chloride: 95 mmol/L — ABNORMAL LOW (ref 98–111)
Creatinine, Ser: 1.48 mg/dL — ABNORMAL HIGH (ref 0.61–1.24)
GFR calc Af Amer: 47 mL/min — ABNORMAL LOW (ref >60)
GFR calc non Af Amer: 40 mL/min — ABNORMAL LOW (ref >60)
Glucose, Bld: 108 mg/dL — ABNORMAL HIGH (ref 70–99)
Potassium: 3.8 mmol/L (ref 3.5–5.1)
Sodium: 138 mmol/L (ref 135–145)

## 2018-04-18 LAB — URIC ACID: Uric Acid, Serum: 6.9 mg/dL (ref 3.7–8.6)

## 2018-04-18 MED ORDER — SODIUM CHLORIDE 0.9 % IV BOLUS
500.0000 mL | Freq: Once | INTRAVENOUS | Status: AC
  Administered 2018-04-18: 500 mL via INTRAVENOUS

## 2018-04-18 MED ORDER — METHYLPREDNISOLONE ACETATE 40 MG/ML IJ SUSP
40.0000 mg | Freq: Once | INTRAMUSCULAR | Status: DC
  Filled 2018-04-18: qty 1

## 2018-04-18 MED ORDER — BUPIVACAINE HCL (PF) 0.5 % IJ SOLN
10.0000 mL | Freq: Once | INTRAMUSCULAR | Status: DC
  Filled 2018-04-18: qty 10

## 2018-04-18 NOTE — Consult Note (Addendum)
Reason for Consult:Right knee effusion Referring Physician: A Diangelo Radel [name] is an 85 y.o. male.  HPI: Travis Kelly was admitted a couple of days ago with SVT/afib w/RVR and fluid overload. He also fell about a week ago and has had some right knee swelling. He has seen Dr. Tonita Cong in the past and had the joint aspirated for effusions 2/2 OA (according to pt). The pt is having some difficulties with mentation currently and history is a little suspect. He c/o right knee pain that's bad enough it's limiting his ability to ambulate and participate with PT. He denies hx/o gout or fevers, chills, sweats, N/V.  Past Medical History:  Diagnosis Date  . Age-related macular degeneration, dry, left eye   . Age-related macular degeneration, wet, right eye ([place])   . Arthritis    "right knee" (04/17/2018)  . CHF (congestive heart failure) ([place])   . GERD (gastroesophageal reflux disease)   . Heart murmur   . History of duodenal ulcer   . History of hiatal hernia   . LAFB (left anterior fascicular block)   . Mitral valve disorder    Moderate MR  . Nephrolithiasis 2013  . Pneumonia 1938; ?date   "once as a child; once as an adult" (04/17/2018)  . PSVT (paroxysmal supraventricular tachycardia) ([place]) 06/2016    Past Surgical History:  Procedure Laterality Date  . CARDIAC CATHETERIZATION    . CATARACT EXTRACTION W/ INTRAOCULAR LENS  IMPLANT, BILATERAL Bilateral 2013  . SKIN BIOPSY Left    "thigh; suspected CA; it was not"    Family History  Problem Relation Age of Onset  . Cancer Mother   . Pneumonia Father     Social History:  reports that he has quit smoking. He has never used smokeless tobacco. He reports current alcohol use of about 3.0 standard drinks of alcohol per week. He reports that he does not use drugs.  Allergies:  Allergies  Allergen Reactions  . Barbital Rash and Other (See Comments)    Blue lips and swollen tongue    Medications: I have reviewed the patient's current  medications.  Results for orders placed or performed during the hospital encounter of 04/16/18 (from the past 48 hour(s))  Comprehensive metabolic panel     Status: Abnormal   Collection Time: 04/16/18  6:24 PM  Result Value Ref Range   Sodium 138 135 - 145 mmol/L   Potassium 4.1 3.5 - 5.1 mmol/L   Chloride 106 98 - 111 mmol/L   CO2 23 22 - 32 mmol/L   Glucose, Bld 109 (H) 70 - 99 mg/dL   BUN 28 (H) 8 - 23 mg/dL   Creatinine, Ser 1.20 0.61 - 1.24 mg/dL   Calcium 8.7 (L) 8.9 - 10.3 mg/dL   Total Protein 6.9 6.5 - 8.1 g/dL   Albumin 3.6 3.5 - 5.0 g/dL   AST 78 (H) 15 - 41 U/L   ALT 72 (H) 0 - 44 U/L   Alkaline Phosphatase 73 38 - 126 U/L   Total Bilirubin 0.8 0.3 - 1.2 mg/dL   GFR calc non Af Amer 52 (L) >60 mL/min   GFR calc Af Amer >60 >60 mL/min   Anion gap 9 5 - 15    Comment: Performed at [facility], [address] [place], [place] [address]  Brain natriuretic peptide     Status: Abnormal   Collection Time: 04/16/18  6:24 PM  Result Value Ref Range   B Natriuretic Peptide 1,745.6 (  H) 0.0 - 100.0 pg/mL    Comment: Performed at [facility], [place] [address] [place], [place] [address]  Troponin I - Once     Status: Abnormal   Collection Time: 04/16/18  6:24 PM  Result Value Ref Range   Troponin I 0.05 (HH) <0.03 ng/mL    Comment: CRITICAL RESULT CALLED TO, READ BACK BY AND VERIFIED WITH: P PREZEA,RN 1934 04/16/2017 WBOND 04/16/2018 Performed at [facility] [address], [place], [place] [address] CORRECTED ON 01/06 AT 2313: PREVIOUSLY REPORTED AS 0.05 CRITICAL RESULT CALLED TO, READ BACK BY AND VERIFIED WITH: P PREZEA,RN 1934 04/16/2017 WBOND   CBC with Differential     Status: Abnormal   Collection Time: 04/16/18  6:24 PM  Result Value Ref Range   WBC 6.5 4.0 - 10.5 K/uL   RBC 3.64 (L) 4.22 - 5.81 MIL/uL   Hemoglobin 11.9 (L) 13.0 - 17.0 g/dL   HCT 37.1 (L) 39.0 - 52.0 %   MCV 101.9 (H) 80.0 - 100.0 fL   MCH 32.7 26.0 - 34.0 pg   MCHC 32.1  30.0 - 36.0 g/dL   RDW 14.1 11.5 - 15.5 %   Platelets 248 150 - 400 K/uL   nRBC 0.0 0.0 - 0.2 %   Neutrophils Relative % 71 %   Neutro Abs 4.6 1.7 - 7.7 K/uL   Lymphocytes Relative 18 %   Lymphs Abs 1.2 0.7 - 4.0 K/uL   Monocytes Relative 9 %   Monocytes Absolute 0.6 0.1 - 1.0 K/uL   Eosinophils Relative 1 %   Eosinophils Absolute 0.1 0.0 - 0.5 K/uL   Basophils Relative 1 %   Basophils Absolute 0.0 0.0 - 0.1 K/uL   Immature Granulocytes 0 %   Abs Immature Granulocytes 0.02 0.00 - 0.07 K/uL    Comment: Performed at [facility], [address], [place], [place] [address]  Magnesium     Status: None   Collection Time: 04/16/18  6:24 PM  Result Value Ref Range   Magnesium 2.1 1.7 - 2.4 mg/dL    Comment: Performed at [facility], [place] [address] [place], [place] [address]  Troponin I - Now Then Q6H     Status: Abnormal   Collection Time: 04/17/18 12:50 AM  Result Value Ref Range   Troponin I 0.07 (HH) <0.03 ng/mL    Comment: CRITICAL VALUE NOTED.  VALUE IS CONSISTENT WITH PREVIOUSLY REPORTED AND CALLED VALUE. Performed at [facility], [place] [address] [place], [place] [address]   Creatinine, serum     Status: Abnormal   Collection Time: 04/17/18 12:50 AM  Result Value Ref Range   Creatinine, Ser 1.37 (H) 0.61 - 1.24 mg/dL   GFR calc non Af Amer 44 (L) >60 mL/min   GFR calc Af Amer 51 (L) >60 mL/min    Comment: Performed at [facility] [address], [place], [place] [address]  Hepatitis panel, acute     Status: None   Collection Time: 04/17/18 12:50 AM  Result Value Ref Range   Hepatitis B Surface Ag Negative Negative   HCV Ab <0.1 0.0 - 0.9 s/co ratio    Comment: (NOTE)                                  Negative:     < 0.8  Indeterminate: 0.8 - 0.9                                  Positive:     > 0.9 The CDC recommends that a positive HCV antibody result be followed up with a HCV Nucleic Acid Amplification test  (865784). Performed At: [facility] [place], [place] [id number] Rush Farmer MD [id number]    Hep A IgM Negative Negative   Hep B C IgM Negative Negative  Hemoglobin A1c     Status: None   Collection Time: 04/17/18  2:54 AM  Result Value Ref Range   Hgb A1c MFr Bld 5.3 4.8 - 5.6 %    Comment: (NOTE) Pre diabetes:          5.7%-6.4% Diabetes:              >6.4% Glycemic control for   <7.0% adults with diabetes    Mean Plasma Glucose 105.41 mg/dL    Comment: Performed at [facility] [address] [place], [place] [address]  Lipid panel     Status: None   Collection Time: 04/17/18  2:54 AM  Result Value Ref Range   Cholesterol 141 0 - 200 mg/dL   Triglycerides 41 <150 mg/dL   HDL 53 >40 mg/dL   Total CHOL/HDL Ratio 2.7 RATIO   VLDL 8 0 - 40 mg/dL   LDL Cholesterol 80 0 - 99 mg/dL    Comment:        Total Cholesterol/HDL:CHD Risk Coronary Heart Disease Risk Table                     Men   Women  1/2 Average Risk   3.4   3.3  Average Risk       5.0   4.4  2 X Average Risk   9.6   7.1  3 X Average Risk  23.4   11.0        Use the calculated Patient Ratio above and the CHD Risk Table to determine the patient's CHD Risk.        ATP III CLASSIFICATION (LDL):  <100     mg/dL   Optimal  100-129  mg/dL   Near or Above                    Optimal  130-159  mg/dL   Borderline  160-189  mg/dL   High  >190     mg/dL   Very High Performed at [facility] [address], [place], [place] [address]   Troponin I - Now Then Q6H     Status: Abnormal   Collection Time: 04/17/18  2:54 AM  Result Value Ref Range   Troponin I 0.13 (HH) <0.03 ng/mL    Comment: CRITICAL VALUE NOTED.  VALUE IS CONSISTENT WITH PREVIOUSLY REPORTED AND CALLED VALUE. Performed at [facility], [place] [address], [place], [place] [address]   TSH     Status: None   Collection Time: 04/17/18  2:54 AM  Result Value Ref Range   TSH 3.997 0.350 - 4.500 uIU/mL     Comment: Performed by a 3rd Generation assay with a functional sensitivity of <=0.01 uIU/mL. Performed at [facility], [place] [address], [place], [place] [address]   Basic metabolic panel     Status: Abnormal   Collection  Time: 04/17/18  2:54 AM  Result Value Ref Range   Sodium 141 135 - 145 mmol/L   Potassium 3.9 3.5 - 5.1 mmol/L   Chloride 106 98 - 111 mmol/L   CO2 25 22 - 32 mmol/L   Glucose, Bld 113 (H) 70 - 99 mg/dL   BUN 29 (H) 8 - 23 mg/dL   Creatinine, Ser 1.34 (H) 0.61 - 1.24 mg/dL   Calcium 8.6 (L) 8.9 - 10.3 mg/dL   GFR calc non Af Amer 45 (L) >60 mL/min   GFR calc Af Amer 53 (L) >60 mL/min   Anion gap 10 5 - 15    Comment: Performed at [facility] [address], [place], [place] [address]  Troponin I - Now Then Q6H     Status: Abnormal   Collection Time: 04/17/18  8:56 AM  Result Value Ref Range   Troponin I 0.28 (HH) <0.03 ng/mL    Comment: CRITICAL VALUE NOTED.  VALUE IS CONSISTENT WITH PREVIOUSLY REPORTED AND CALLED VALUE. Performed at [facility], [place] [address], [place], [place] [address]   Basic metabolic panel     Status: Abnormal   Collection Time: 04/18/18  7:35 AM  Result Value Ref Range   Sodium 138 135 - 145 mmol/L   Potassium 3.8 3.5 - 5.1 mmol/L   Chloride 95 (L) 98 - 111 mmol/L   CO2 32 22 - 32 mmol/L   Glucose, Bld 108 (H) 70 - 99 mg/dL   BUN 28 (H) 8 - 23 mg/dL   Creatinine, Ser 1.48 (H) 0.61 - 1.24 mg/dL   Calcium 8.7 (L) 8.9 - 10.3 mg/dL   GFR calc non Af Amer 40 (L) >60 mL/min   GFR calc Af Amer 47 (L) >60 mL/min   Anion gap 11 5 - 15    Comment: Performed at [facility] [address] [place], [place] [address]  CBC     Status: Abnormal   Collection Time: 04/18/18  7:35 AM  Result Value Ref Range   WBC 10.6 (H) 4.0 - 10.5 K/uL   RBC 3.59 (L) 4.22 - 5.81 MIL/uL   Hemoglobin 11.7 (L) 13.0 - 17.0 g/dL   HCT 35.9 (L) 39.0 - 52.0 %   MCV 100.0 80.0 - 100.0 fL   MCH 32.6 26.0 - 34.0 pg   MCHC 32.6 30.0 - 36.0  g/dL   RDW 14.1 11.5 - 15.5 %   Platelets 237 150 - 400 K/uL   nRBC 0.0 0.0 - 0.2 %    Comment: Performed at [facility], [place] [address] [place],  [address]    Dg Chest Port 1 View  Result Date: 04/16/2018 CLINICAL DATA:  Supraventricular tachycardia.  No chest pain. EXAM: PORTABLE CHEST 1 VIEW COMPARISON:  CT 07/05/2016.  Radiographs 07/04/2016. FINDINGS: 1734 hours. There are lower lung volumes with mild bibasilar atelectasis and a probable small left pleural effusion. There is vascular congestion without overt pulmonary edema. Cardiomegaly and aortic atherosclerosis are stable. There is no pneumothorax or confluent airspace opacity. No acute osseous findings are evident. Telemetry leads overlie the chest. IMPRESSION: Cardiomegaly, vascular congestion and small left pleural effusion. No overt pulmonary edema. Electronically Signed   By: Richardean Sale M.D.   On: 04/16/2018 17:54   Dg Knee Right Port  Result Date: 04/18/2018 CLINICAL DATA:  Right knee pain. EXAM: PORTABLE RIGHT KNEE - 1-2 VIEW COMPARISON:  None. FINDINGS: There is medial joint space narrowing with  moderate marginal osteophyte formation in the medial and patellofemoral compartments. There is a joint effusion with soft tissue swelling in the region of the distal quadriceps tendon. There is also calcification in the suprapatellar recess of the joint. Arterial vascular calcifications around the knee. IMPRESSION: Moderate osteoarthritis of the right knee with a joint effusion and soft tissue swelling in the region of the distal quadriceps tendon. Electronically Signed   By: Lorriane Shire M.D.   On: 04/18/2018 11:52    Review of Systems  Constitutional: Negative for weight loss.  HENT: Negative for ear discharge, ear pain, hearing loss and tinnitus.   Eyes: Negative for blurred vision, double vision, photophobia and pain.  Respiratory: Negative for cough, sputum production and shortness of breath.   Cardiovascular:  Negative for chest pain.  Gastrointestinal: Negative for abdominal pain, nausea and vomiting.  Genitourinary: Negative for dysuria, flank pain, frequency and urgency.  Musculoskeletal: Positive for joint pain (Right knee). Negative for back pain, falls, myalgias and neck pain.  Neurological: Negative for dizziness, tingling, sensory change, focal weakness, loss of consciousness and headaches.  Endo/Heme/Allergies: Does not bruise/bleed easily.  Psychiatric/Behavioral: Negative for depression, memory loss and substance abuse. The patient is not nervous/anxious.    Blood pressure (!) 96/56, pulse (!) 56, temperature (!) 97.4 F (36.3 C), temperature source Oral, resp. rate 20, height 5\' 6"  (1.676 m), weight 75.3 kg, SpO2 (!) 89 %. Physical Exam  Constitutional: He appears well-developed and well-nourished. No distress.  HENT:  Head: Normocephalic and atraumatic.  Eyes: Conjunctivae are normal. Right eye exhibits no discharge. Left eye exhibits no discharge. No scleral icterus.  Neck: Normal range of motion.  Cardiovascular: Normal rate and regular rhythm.  Respiratory: Effort normal. No respiratory distress.  Musculoskeletal:     Comments: RLE No traumatic wounds, ecchymosis, or rash  Mod diffuse TTP, pain with A/PROM but able to range through ~45 degrees  Mod knee effusion  Knee stable to varus/ valgus and anterior/posterior stress  Sens DPN, SPN, TN paresthetic  Motor EHL, ext, flex, evers 5/5  DP 1+, PT 0, No significant edema  Neurological: He is alert.  Skin: Skin is warm and dry. He is not diaphoretic.  Psychiatric: He has a normal mood and affect. His behavior is normal.    Assessment/Plan: Right knee effusion -- Will aspirate and, if appropriate, inject with depomedrol. Fluid for cell count and crystals, GS/C&S if cloudy. He should f/u with Dr. Tonita Cong after discharge.  Travis Abu, PA-C Orthopedic Surgery [phone] 04/18/2018, 3:47 PM    Addendum dictation:  04/19/2018  Patient is sitting comfortably complaining of right knee pain.  Obvious effusion is noted.  No ecchymosis.  Patient is able to actively and passively range the knee with some discomfort but not debilitating pain.  He has intact peripheral pulses bilaterally.  No focal neurological deficits in the lower extremity.  Moderate knee effusion is noted.  The knee is stable to varus and valgus stress testing.  Compartments in the lower extremity are soft and nontender.  Results of the knee aspirate: Yellow fluid appearance with positive intracellular calcium pyrophosphate crystals.  White blood cell count: 88,500.  Neutrophil count is 91.  Clinical exam is essentially unchanged from yesterday evening.  Aspirate is consistent with pseudogout but there was concern because of the elevated white count.  Antibiotic coverage was requested which I think is reasonable.  The patient already had an articular aspiration and injection of steroid.  At this point time I will review  the history and findings with Dr. Tonita Cong.  At this point I do not think this is a truly septic joint that requires emergent surgical I&D.  This is most likely pseudogout.  Continue antibiotics until Dr. Tonita Cong has an opportunity to evaluate.

## 2018-04-18 NOTE — Progress Notes (Addendum)
BP low at afternoon vital check.   Checked a manual BP.  96/56.  Patient has only had 225 ml urinary output since last Lasix administration.   Paged MD.   Patient feels sleepy, but otherwise no SOB, CP or Confusion.

## 2018-04-18 NOTE — Progress Notes (Signed)
Paged MD- patient's BP still low 82/58 by manual.   MD messaged back- to give another bolus of 500 ml over 2 hours.  Will hold PM lasix

## 2018-04-18 NOTE — Progress Notes (Signed)
PT Cancellation Note  Patient Details Name: Travis Kelly MRN: [id number] DOB: [birth year]   Cancelled Treatment:    Reason Eval/Treat Not Completed: Medical issues which prohibited therapy;Patient not medically ready(Chart reviewed; pt still with uptrending troponins, also awaiting ortho consult. Will follow acutely and evaluate once appropriate. )   2:56 PM, 04/18/18 Etta Grandchild, PT, DPT Physical Therapist - [place] [phone] (Pager)  [phone] (Office)      Ambrie Carte C 04/18/2018, 2:56 PM

## 2018-04-18 NOTE — Progress Notes (Signed)
   04/18/18 2117  Vitals  BP (!) 91/58  MAP (mmHg) 69  BP Method Automatic  Patient Position (if appropriate) Lying  Pulse Rate (!) 58  Pulse Rate Source Monitor  Resp 17  Oxygen Therapy  SpO2 94 %  O2 Device Nasal Cannula  O2 Flow Rate (L/min) 1 L/min  Pt's BP low, HR=58. Tylene Fantasia NP notified and ordered to hold metoprolol and flecainide tonight. Pt and son made aware.

## 2018-04-18 NOTE — Progress Notes (Signed)
Doing well today No arrhythmias overnight  Clinically improved  OK to discharge form EP standpoint I will arrange follow-up with me  Thompson Grayer MD, [facility] 04/18/2018 8:44 AM

## 2018-04-18 NOTE — Progress Notes (Signed)
PROGRESS NOTE    Travis Kelly  VEH:[id number] DOB: Oct 05, 1922 DOA: 04/16/2018 PCP: [name], MD   Brief Narrative:  85 year old with history of mitral valve regurgitation, paroxysmal SVT, BPH, diastolic CHF, CKD stage III came to the hospital with complains of palpitation.  Patient was found to be in paroxysmal atrial fibrillation which resolved with vagal maneuvers but he was also noted to be fluid overloaded during the hospitalization.  Patient was started on IV Lasix 40 mg twice daily and over the course of couple of days became more euvolemic.  No further episodes of SVT were noted. Patient reported right lower extremity swelling secondary to fall about a week ago.  Denies any previous history of gout.  X-ray showed joint effusion therefore orthopedic was consulted.   Assessment & Plan:   Principal Problem:   Acute on chronic diastolic CHF (congestive heart failure) (HCC) Active Problems:   Elevated troponin   Chronic renal insufficiency, stage 3 (moderate) (HCC)   BPH (benign prostatic hyperplasia)   PSVT (paroxysmal supraventricular tachycardia) (HCC)   Abnormal liver function  Right knee swelling concerning for effusion - Orthopedic has been consulted, awaiting callback -X-ray shows effusion -Pain control, rest, ice, elevation.  Hypotension -Possibly secondary to overdiuresis.  400 cc of normal saline bolus ordered.  Acute on chronic diastolic congestive heart failure, class II -Patient appears to be more euvolemic.  No chest pain -Transition to oral Lasix.  Appreciate cardiology input -Outpatient follow-up with cardiology.  Paroxysmal SVT -This is resolved with vagal maneuver.  Metoprolol reduced to 25 mg twice daily.Flecainide 50 mg twice daily started  Chronic kidney disease stage III -Creatinine appears to be at baseline.  Follow-up outpatient.  DVT prophylaxis: Lovenox Code Status: Full code Family Communication: Grandson at bedside, spoke with patient's  wife over the phone Disposition Plan: Maintain inpatient stay until blood pressure is better.  Currently patient is hypotensive with systolic in 83M.  Consultants:   Cardiology  Procedures:   None  Antimicrobials:   None   Subjective: Patient reports of right knee pain and swelling this morning this is worsened over the last 24 hours.  Patient admits of falling about a week ago on the right knee.  Review of Systems Otherwise negative except as per HPI, including: General: Denies fever, chills, night sweats or unintended weight loss. Resp: Denies cough, wheezing, shortness of breath. Cardiac: Denies chest pain, palpitations, orthopnea, paroxysmal nocturnal dyspnea. GI: Denies abdominal pain, nausea, vomiting, diarrhea or constipation GU: Denies dysuria, frequency, hesitancy or incontinence MS: Denies muscle aches Neuro: Denies headache, neurologic deficits (focal weakness, numbness, tingling), abnormal gait Psych: Denies anxiety, depression, SI/HI/AVH Skin: Denies new rashes or lesions ID: Denies sick contacts, exotic exposures, travel  Objective: Vitals:   04/18/18 0822 04/18/18 0853 04/18/18 1226 04/18/18 1236  BP: (!) 92/54 (!) 117/59 (!) 85/50 (!) 96/56  Pulse: 63 68 (!) 55 (!) 56  Resp: 17  20   Temp: (!) 97.5 F (36.4 C)  (!) 97.4 F (36.3 C)   TempSrc: Oral  Oral   SpO2: 92% 99% (!) 89%   Weight:      Height:        Intake/Output Summary (Last 24 hours) at 04/18/2018 1315 Last data filed at 04/18/2018 1111 Gross per 24 hour  Intake -  Output 925 ml  Net -925 ml   Filed Weights   04/16/18 1825 04/17/18 0930 04/18/18 0511  Weight: 72.6 kg 74.8 kg 75.3 kg    Examination:  General exam:  Appears calm and comfortable  Respiratory system: Clear to auscultation. Respiratory effort normal. Cardiovascular system: S1 & S2 heard, RRR. No JVD, murmurs, rubs, gallops or clicks. No pedal edema. Gastrointestinal system: Abdomen is nondistended, soft and nontender. No  organomegaly or masses felt. Normal bowel sounds heard. Central nervous system: Alert and oriented. No focal neurological deficits. Extremities: Right knee swelling noted.  Limited range of motion Skin: No rashes, lesions or ulcers Psychiatry: Judgement and insight appear normal. Mood & affect appropriate.     Data Reviewed:   CBC: Recent Labs  Lab 04/16/18 1824 04/18/18 0735  WBC 6.5 10.6*  NEUTROABS 4.6  --   HGB 11.9* 11.7*  HCT 37.1* 35.9*  MCV 101.9* 100.0  PLT 248 387   Basic Metabolic Panel: Recent Labs  Lab 04/16/18 1824 04/17/18 0050 04/17/18 0254 04/18/18 0735  NA 138  --  141 138  K 4.1  --  3.9 3.8  CL 106  --  106 95*  CO2 23  --  25 32  GLUCOSE 109*  --  113* 108*  BUN 28*  --  29* 28*  CREATININE 1.20 1.37* 1.34* 1.48*  CALCIUM 8.7*  --  8.6* 8.7*  MG 2.1  --   --   --    GFR: Estimated Creatinine Clearance: 28.1 mL/min (A) (by C-G formula based on SCr of 1.48 mg/dL (H)). Liver Function Tests: Recent Labs  Lab 04/16/18 1824  AST 78*  ALT 72*  ALKPHOS 73  BILITOT 0.8  PROT 6.9  ALBUMIN 3.6   No results for input(s): LIPASE, AMYLASE in the last 168 hours. No results for input(s): AMMONIA in the last 168 hours. Coagulation Profile: No results for input(s): INR, PROTIME in the last 168 hours. Cardiac Enzymes: Recent Labs  Lab 04/16/18 1824 04/17/18 0050 04/17/18 0254 04/17/18 0856  TROPONINI 0.05* 0.07* 0.13* 0.28*   BNP (last 3 results) No results for input(s): PROBNP in the last 8760 hours. HbA1C: Recent Labs    04/17/18 0254  HGBA1C 5.3   CBG: No results for input(s): GLUCAP in the last 168 hours. Lipid Profile: Recent Labs    04/17/18 0254  CHOL 141  HDL 53  LDLCALC 80  TRIG 41  CHOLHDL 2.7   Thyroid Function Tests: Recent Labs    04/17/18 0254  TSH 3.997   Anemia Panel: No results for input(s): VITAMINB12, FOLATE, FERRITIN, TIBC, IRON, RETICCTPCT in the last 72 hours. Sepsis Labs: No results for input(s):  PROCALCITON, LATICACIDVEN in the last 168 hours.  No results found for this or any previous visit (from the past 240 hour(s)).       Radiology Studies: Dg Chest Port 1 View  Result Date: 04/16/2018 CLINICAL DATA:  Supraventricular tachycardia.  No chest pain. EXAM: PORTABLE CHEST 1 VIEW COMPARISON:  CT 07/05/2016.  Radiographs 07/04/2016. FINDINGS: 1734 hours. There are lower lung volumes with mild bibasilar atelectasis and a probable small left pleural effusion. There is vascular congestion without overt pulmonary edema. Cardiomegaly and aortic atherosclerosis are stable. There is no pneumothorax or confluent airspace opacity. No acute osseous findings are evident. Telemetry leads overlie the chest. IMPRESSION: Cardiomegaly, vascular congestion and small left pleural effusion. No overt pulmonary edema. Electronically Signed   By: Richardean Sale M.D.   On: 04/16/2018 17:54   Dg Knee Right Port  Result Date: 04/18/2018 CLINICAL DATA:  Right knee pain. EXAM: PORTABLE RIGHT KNEE - 1-2 VIEW COMPARISON:  None. FINDINGS: There is medial joint space narrowing with moderate marginal  osteophyte formation in the medial and patellofemoral compartments. There is a joint effusion with soft tissue swelling in the region of the distal quadriceps tendon. There is also calcification in the suprapatellar recess of the joint. Arterial vascular calcifications around the knee. IMPRESSION: Moderate osteoarthritis of the right knee with a joint effusion and soft tissue swelling in the region of the distal quadriceps tendon. Electronically Signed   By: Lorriane Shire M.D.   On: 04/18/2018 11:52        Scheduled Meds: . aspirin EC  81 mg Oral Daily  . calcium-vitamin D  1 tablet Oral Q breakfast  . cholecalciferol  2,000 Units Oral Daily  . enoxaparin (LOVENOX) injection  40 mg Subcutaneous Daily  . finasteride  5 mg Oral Daily  . flecainide  50 mg Oral Q12H  . furosemide  40 mg Intravenous Q12H  . metoprolol  tartrate  25 mg Oral BID  . omega-3 acid ethyl esters  1,000 mg Oral Daily  . sodium chloride flush  3 mL Intravenous Q12H  . vitamin B-12  1,000 mcg Oral Daily  . vitamin C  500 mg Oral Daily   Continuous Infusions: . sodium chloride    . sodium chloride       LOS: 0 days   Time spent= 25 mins    Ankit Arsenio Loader, MD [facility]  If 7PM-7AM, please contact night-coverage [email or web address] 04/18/2018, 1:15 PM

## 2018-04-18 NOTE — Procedures (Signed)
Procedure: Right knee aspiration and injection  Indication: Right knee effusion(s)  Surgeon: Silvestre Gunner, PA-C  Assist: None  Anesthesia: None  EBL: None  Complications: None  Findings: After risks/benefits explained patient desires to undergo procedure. Consent obtained and time out performed. The right knee was sterilely prepped and aspirated. 66ml turbid yellow fluid obtained. 26ml 0.5% Marcaine and 40mg  Depomedrol instilled. Pt tolerated the procedure well.    Lisette Abu, PA-C Orthopedic Surgery [phone]

## 2018-04-19 ENCOUNTER — Inpatient Hospital Stay (HOSPITAL_COMMUNITY): Payer: No Typology Code available for payment source

## 2018-04-19 DIAGNOSIS — N179 Acute kidney failure, unspecified: Secondary | ICD-10-CM

## 2018-04-19 DIAGNOSIS — M009 Pyogenic arthritis, unspecified: Secondary | ICD-10-CM

## 2018-04-19 LAB — MAGNESIUM: Magnesium: 1.8 mg/dL (ref 1.7–2.4)

## 2018-04-19 LAB — BASIC METABOLIC PANEL
Anion gap: 10 (ref 5–15)
BUN: 40 mg/dL — ABNORMAL HIGH (ref 8–23)
CO2: 27 mmol/L (ref 22–32)
Calcium: 8.3 mg/dL — ABNORMAL LOW (ref 8.9–10.3)
Chloride: 96 mmol/L — ABNORMAL LOW (ref 98–111)
Creatinine, Ser: 2.54 mg/dL — ABNORMAL HIGH (ref 0.61–1.24)
GFR calc Af Amer: 24 mL/min — ABNORMAL LOW (ref >60)
GFR calc non Af Amer: 21 mL/min — ABNORMAL LOW (ref >60)
GLUCOSE: 138 mg/dL — AB (ref 70–99)
Potassium: 4.3 mmol/L (ref 3.5–5.1)
Sodium: 133 mmol/L — ABNORMAL LOW (ref 135–145)

## 2018-04-19 LAB — OSMOLALITY, URINE: Osmolality, Ur: 352 mOsm/kg (ref 300–900)

## 2018-04-19 LAB — NA AND K (SODIUM & POTASSIUM), RAND UR
Potassium Urine: 33 mmol/L
SODIUM UR: 40 mmol/L

## 2018-04-19 LAB — CHLORIDE, URINE, RANDOM: Chloride Urine: 42 mmol/L

## 2018-04-19 MED ORDER — VANCOMYCIN HCL 10 G IV SOLR
1500.0000 mg | Freq: Once | INTRAVENOUS | Status: AC
  Administered 2018-04-19: 1500 mg via INTRAVENOUS
  Filled 2018-04-19: qty 1500

## 2018-04-19 MED ORDER — VANCOMYCIN HCL IN DEXTROSE 1-5 GM/200ML-% IV SOLN: 1000.0000 mg | INTRAVENOUS | Status: DC

## 2018-04-19 MED ORDER — SODIUM CHLORIDE 0.9 % IV SOLN
2.0000 g | INTRAVENOUS | Status: DC
  Administered 2018-04-19: 2 g via INTRAVENOUS
  Filled 2018-04-19 (×2): qty 20

## 2018-04-19 MED ORDER — ENOXAPARIN SODIUM 30 MG/0.3ML ~~LOC~~ SOLN
30.0000 mg | Freq: Every day | SUBCUTANEOUS | Status: DC
  Administered 2018-04-20: 30 mg via SUBCUTANEOUS
  Filled 2018-04-19: qty 0.3

## 2018-04-19 NOTE — Evaluation (Addendum)
Physical Therapy Evaluation Patient Details Name: Travis Kelly MRN: [id number] DOB: 27-Aug-1922 Today's Date: 04/19/2018   History of Present Illness  Pt is a 85 y.o. male admitted from [place] on 04/16/18 with c/o palpitation. Pt found to have paroxysmal afib reolved with vagal maneuvers; also noted to be in fluid overload, worked up for CHF. Pt reports significant R knee swelling, imaging showed joint effusion; s/p R knee aspiration and injection 1/8. PMH includes CHF, CKD III, MVR, paroxysmal SVT.    Clinical Impression  Pt presents with an overall decrease in functional mobility secondary to above. PTA, pt resides at [place] apartment with wife; pt ambulatory with RW, intermittent use of scooter due to chronic knee pain; wife is w/c bound requiring aide assist for mobility. Today, pt required min-modA for mobility with RW; limited by generalized weakness, decreased activity tolerance at pain. At high risk for falls. Pt motivated to regain PLOF. Pt would benefit from continued acute PT services to maximize functional mobility and independence prior to d/c with SNF-level therapies.     Follow Up Recommendations SNF;Supervision for mobility/OOB    Equipment Recommendations  None recommended by PT    Recommendations for Other Services       Precautions / Restrictions Precautions Precautions: Fall Restrictions Weight Bearing Restrictions: No      Mobility  Bed Mobility Overal bed mobility: Modified Independent             General bed mobility comments: HOB elevated, increased time and effort; use of bed rail  Transfers Overall transfer level: Needs assistance Equipment used: Rolling walker (2 wheeled) Transfers: Sit to/from Stand Sit to Stand: Mod assist         General transfer comment: ModA for trunk elevation standing to RW; pt with significant posterior lean requiring modA to prevent LOB and multimodal cues for upright posture with standing and pre-gait  activity  Ambulation/Gait Ambulation/Gait assistance: Mod assist;Min assist Gait Distance (Feet): 14 Feet   Gait Pattern/deviations: Step-to pattern;Decreased weight shift to right;Antalgic;Trunk flexed;Leaning posteriorly Gait velocity: Decreased Gait velocity interpretation: <1.31 ft/sec, indicative of household ambulator General Gait Details: Amb with RW and initial modA to prevent posterior LOB due to lean, progressing to minA for balance; anterior weight translation improved with amb distance  Stairs            Wheelchair Mobility    Modified Rankin (Stroke Patients Only)       Balance Overall balance assessment: Needs assistance   Sitting balance-Leahy Scale: Fair Sitting balance - Comments: Able to reach bilateral ankles sitting EOB     Standing balance-Leahy Scale: Poor Standing balance comment: Reliant on UE support and external assist                             Pertinent Vitals/Pain Pain Assessment: Faces Faces Pain Scale: Hurts a little bit Pain Location: R knee Pain Descriptors / Indicators: Sore;Guarding Pain Intervention(s): Monitored during session;Limited activity within patient's tolerance;Ice applied    Home Living Family/patient expects to be discharged to:: Private residence([facility]) Living Arrangements: Spouse/significant other Available Help at Discharge: Family;Personal care attendant;Available PRN/intermittently Type of Home: Apartment Home Access: Level entry;Elevator     Home Layout: One level Home Equipment: Walker - 2 wheels;Cane - single point;Shower seat - built Teacher, adult education - 4 wheels Additional Comments: Lives in [place]. Wife wheelchair/scooter bound, has personal aide assist for ADLs and transfers.     Prior Function  Level of Independence: Independent with assistive device(s)         Comments: Was using SPC until fall, has been using RW since. Able to ambulate to dining hall;  sometimes uses wife's scooter when she stays in room. Working with HHPT 2x/wk     Hand Dominance   Dominant Hand: Right    Extremity/Trunk Assessment   Upper Extremity Assessment Upper Extremity Assessment: Defer to OT evaluation    Lower Extremity Assessment Lower Extremity Assessment: Generalized weakness    Cervical / Trunk Assessment Cervical / Trunk Assessment: Kyphotic  Communication   Communication: No difficulties  Cognition Arousal/Alertness: Awake/alert Behavior During Therapy: WFL for tasks assessed/performed Overall Cognitive Status: Within Functional Limits for tasks assessed                                 General Comments: WFL for simple tasks; some slowed processing noted, likely baseline cognition      General Comments General comments (skin integrity, edema, etc.): Daughter present and supportive    Exercises     Assessment/Plan    PT Assessment Patient needs continued PT services  PT Problem List Decreased strength;Decreased range of motion;Decreased activity tolerance;Decreased balance;Decreased mobility;Pain       PT Treatment Interventions DME instruction;Gait training;Functional mobility training;Therapeutic activities;Therapeutic exercise;Balance training;Patient/family education    PT Goals (Current goals can be found in the Care Plan section)  Acute Rehab PT Goals Patient Stated Goal: Get stronger to return home PT Goal Formulation: With patient/family Time For Goal Achievement: 05/03/18 Potential to Achieve Goals: Good    Frequency Min 2X/week   Barriers to discharge        Co-evaluation               AM-PAC PT "6 Clicks" Mobility  Outcome Measure Help needed turning from your back to your side while in a flat bed without using bedrails?: None Help needed moving from lying on your back to sitting on the side of a flat bed without using bedrails?: None Help needed moving to and from a bed to a chair  (including a wheelchair)?: A Lot Help needed standing up from a chair using your arms (e.g., wheelchair or bedside chair)?: A Little Help needed to walk in hospital room?: A Lot Help needed climbing 3-5 steps with a railing? : Total 6 Click Score: 16    End of Session Equipment Utilized During Treatment: Gait belt Activity Tolerance: Patient tolerated treatment well Patient left: in chair;with call bell/phone within reach;with family/visitor present Nurse Communication: Mobility status PT Visit Diagnosis: Other abnormalities of gait and mobility (R26.89);Muscle weakness (generalized) (M62.81)    Time: 0932-6712 PT Time Calculation (min) (ACUTE ONLY): 19 min   Charges:   PT Evaluation $PT Eval Moderate Complexity: [place], PT, DPT Acute Rehabilitation Services  Pager [phone] Office [phone]  Derry Lory 04/19/2018, 1:15 PM

## 2018-04-19 NOTE — Progress Notes (Signed)
Addendum dictation: I have discussed the patient's clinical exam and aspirate findings with Dr.Beane.  Findings most likely represent pseudogout.  Recommend antibiotics tonight and reevaluation in the morning.  If he continues to have significant swelling one could consider re-aspiration.  If the patient develops constitutional symptoms such as fevers, chills, elevated white blood cell count, or significant increase in pain that we may need to consider formal I&D of the knee.

## 2018-04-19 NOTE — Evaluation (Signed)
Occupational Therapy Evaluation Patient Details Name: Travis Kelly MRN: [id number] DOB: 02-Jul-1922 Today's Date: 04/19/2018    History of Present Illness Pt is a 85 y.o. male admitted from [place] on 04/16/18 with c/o palpitation. Pt found to have paroxysmal afib reolved with vagal maneuvers; also noted to be in fluid overload, worked up for CHF. Pt reports significant R knee swelling, imaging showed joint effusion; s/p R knee aspiration and injection 1/8. PMH includes CHF, CKD III, MVR, paroxysmal SVT.   Clinical Impression   Pt with decline in function and safety with ADLs and ADL mobility with decreased strength, balance and endurance. PTA pt lived at [facility] with his wife and used RW for mobility and was able to ambulate to dining room and complete ADLs. Pt very motivated to return to PLOF. Pt would benefit from acute OT services to maximize level of function and safety    Follow Up Recommendations  SNF;Supervision/Assistance - 24 hour    Equipment Recommendations  None recommended by OT    Recommendations for Other Services       Precautions / Restrictions Precautions Precautions: Fall Restrictions Weight Bearing Restrictions: No RLE Weight Bearing: Weight bearing as tolerated      Mobility Bed Mobility Overal bed mobility: Modified Independent             General bed mobility comments: HOB elevated, increased time and effort; use of bed rail  Transfers Overall transfer level: Needs assistance Equipment used: Rolling walker (2 wheeled) Transfers: Sit to/from Stand Sit to Stand: Mod assist         General transfer comment: ModA for trunk elevation standing to RW; pt with significant posterior lean requiring modA to prevent LOB and multimodal cues for upright posture with standing and pre-gait activity    Balance Overall balance assessment: Needs assistance Sitting-balance support: No upper extremity supported;Feet supported Sitting balance-Leahy Scale:  Fair Sitting balance - Comments: Able to reach bilateral ankles sitting EOB   Standing balance support: Bilateral upper extremity supported;During functional activity Standing balance-Leahy Scale: Poor Standing balance comment: Reliant on UE support and external assist                           ADL either performed or assessed with clinical judgement   ADL Overall ADL's : Needs assistance/impaired Eating/Feeding: Independent;Sitting   Grooming: Wash/dry hands;Wash/dry face;Min guard;Sitting   Upper Body Bathing: Minimal assistance;Sitting   Lower Body Bathing: Moderate assistance   Upper Body Dressing : Minimal assistance;Sitting   Lower Body Dressing: Maximal assistance   Toilet Transfer: Moderate assistance;Ambulation;RW;BSC;Cueing for safety;Cueing for sequencing   Toileting- Clothing Manipulation and Hygiene: Total assistance;Sit to/from stand       Functional mobility during ADLs: Moderate assistance;Rolling walker;Cueing for safety       Vision Baseline Vision/History: Wears glasses Patient Visual Report: No change from baseline       Perception     Praxis      Pertinent Vitals/Pain Pain Assessment: Faces Faces Pain Scale: Hurts a little bit Pain Location: R knee Pain Descriptors / Indicators: Sore;Guarding Pain Intervention(s): Limited activity within patient's tolerance;Monitored during session;Repositioned;Ice applied     Hand Dominance Right   Extremity/Trunk Assessment Upper Extremity Assessment Upper Extremity Assessment: Generalized weakness   Lower Extremity Assessment Lower Extremity Assessment: Defer to PT evaluation   Cervical / Trunk Assessment Cervical / Trunk Assessment: Kyphotic   Communication Communication Communication: No difficulties   Cognition Arousal/Alertness: Awake/alert Behavior During  Therapy: WFL for tasks assessed/performed Overall Cognitive Status: Within Functional Limits for tasks assessed                                  General Comments: WFL for simple tasks; some slowed processing noted, likely baseline cognition   General Comments  Daughter present and supportive    Exercises     Shoulder Instructions      Home Living Family/patient expects to be discharged to:: Assisted living Living Arrangements: Spouse/significant other Available Help at Discharge: Family;Personal care attendant;Available PRN/intermittently Type of Home: Apartment Home Access: Level entry;Elevator     Home Layout: One level     Bathroom Shower/Tub: Occupational psychologist: Handicapped height Bathroom Accessibility: Yes   Home Equipment: Environmental consultant - 2 wheels;Cane - single point;Shower seat - built Teacher, adult education - 4 wheels   Additional Comments: Lives in [place]. Wife wheelchair/scooter bound, has personal aide assist for ADLs and transfers.       Prior Functioning/Environment Level of Independence: Independent with assistive device(s)        Comments: Was using SPC until fall, has been using RW since. Able to ambulate to dining hall; sometimes uses wife's scooter when she stays in room. Working with HHPT 2x/wk        OT Problem List: Decreased strength;Decreased activity tolerance;Decreased knowledge of use of DME or AE;Impaired balance (sitting and/or standing);Pain      OT Treatment/Interventions: Self-care/ADL training;DME and/or AE instruction;Therapeutic activities;Patient/family education    OT Goals(Current goals can be found in the care plan section) Acute Rehab OT Goals Patient Stated Goal: Get stronger to return home OT Goal Formulation: With patient/family Time For Goal Achievement: 05/03/18 Potential to Achieve Goals: Good ADL Goals Pt Will Perform Grooming: with min guard assist;standing Pt Will Perform Upper Body Bathing: with min guard assist;with supervision;with set-up;sitting Pt Will Perform Lower Body Bathing: with min  assist;sitting/lateral leans Pt Will Perform Upper Body Dressing: with min guard assist;with supervision;with set-up;sitting Pt Will Transfer to Toilet: with min assist;ambulating;regular height toilet;grab bars  OT Frequency: Min 2X/week   Barriers to D/C: Decreased caregiver support          Co-evaluation              AM-PAC OT "6 Clicks" Daily Activity     Outcome Measure Help from another person eating meals?: None Help from another person taking care of personal grooming?: A Little Help from another person toileting, which includes using toliet, bedpan, or urinal?: A Lot Help from another person bathing (including washing, rinsing, drying)?: A Lot Help from another person to put on and taking off regular upper body clothing?: A Little Help from another person to put on and taking off regular lower body clothing?: A Lot 6 Click Score: 16   End of Session Equipment Utilized During Treatment: Gait belt;Rolling walker;Other (comment)(BSC)  Activity Tolerance: Patient tolerated treatment well Patient left: in chair;with call bell/phone within reach;with family/visitor present  OT Visit Diagnosis: Unsteadiness on feet (R26.81);Other abnormalities of gait and mobility (R26.89);History of falling (Z91.81);Muscle weakness (generalized) (M62.81);Pain Pain - Right/Left: Right Pain - part of body: Knee                Time: 1244-1301 OT Time Calculation (min): 17 min Charges:  OT General Charges $OT Visit: 1 Visit OT Evaluation $OT Eval Moderate Complexity: 1 Mod    Britt Bottom 04/19/2018, 2:04  PM

## 2018-04-19 NOTE — Progress Notes (Signed)
PROGRESS NOTE    Travis Kelly  BHA:[id number] DOB: [birth year] DOA: 04/16/2018 PCP: [name], MD   Brief Narrative:  85 year old with history of mitral valve regurgitation, paroxysmal SVT, BPH, diastolic CHF, CKD stage III came to the hospital with complains of palpitation.  Patient was found to be in paroxysmal atrial fibrillation which resolved with vagal maneuvers but he was also noted to be fluid overloaded during the hospitalization.  Patient was started on IV Lasix 40 mg twice daily and over the course of couple of days became more euvolemic.  No further episodes of SVT were noted. Patient reported right lower extremity swelling secondary to fall about a week ago.  Denies any previous history of gout.  X-ray showed joint effusion therefore orthopedic was consulted. Synovial Fluids showed elevated WBC concerning for septic arthritis.    Assessment & Plan:   Principal Problem:   Acute on chronic diastolic CHF (congestive heart failure) (HCC) Active Problems:   Elevated troponin   Chronic renal insufficiency, stage 3 (moderate) (HCC)   BPH (benign prostatic hyperplasia)   PSVT (paroxysmal supraventricular tachycardia) (HCC)   Abnormal liver function   Acute on chronic combined systolic and diastolic CHF (congestive heart failure) (HCC)  Right knee swelling concerning for effusion s/p Drainage  Septic Arthritis, crystalloid arthropathy. Likely from Trauma  - s/p Drainage by Ortho yesterday. Will follow up on Gram stain and cultures. Drawn Blood Cultures. Start patient on Vanc and Rocephin, will tailor with Culture date.  -We will hold off on blood pressure medications if necessary.  AKI on CKD Stage III - Suspect this is from hypotension versus overdiuresis.  Hold off on oral Lasix -Check urine electrolytes, renal ultrasound - Avoid nephrotoxic drugs.  Monitor urine output.  Chronic diastolic congestive heart failure, class II -Intravascularly volume depleted.  No chest  pain -Hold oral Lasix at this time due to elevated renal function and dehydration.  Appreciate cardiology input -Outpatient follow-up with cardiology.  Paroxysmal SVT -This is resolved with vagal maneuver.  Metoprolol reduced to 25 mg twice daily.Flecainide 50 mg twice daily started  DVT prophylaxis: Lovenox Code Status: Full code Family Communication: Daughter is at the bedside Disposition Plan: Patient requires an hospital stay for further care and monitoring especially with no new diagnosis of septic arthritis and acute kidney injury.  He needs work-up for this.  Consultants:   Cardiology  Procedures:   None  Antimicrobials:   Vanc 1/9 >  Rocephin 1/9 >   Subjective: Patient had low blood pressures yesterday responded to gentle hydration.  No complaints besides right knee pain this morning.  Daughter is at the bedside.  Review of Systems Otherwise negative except as per HPI, including: General = no fevers, chills, dizziness, malaise, fatigue HEENT/EYES = negative for pain, redness, loss of vision, double vision, blurred vision, loss of hearing, sore throat, hoarseness, dysphagia Cardiovascular= negative for chest pain, palpitation, murmurs, lower extremity swelling Respiratory/lungs= negative for shortness of breath, cough, hemoptysis, wheezing, mucus production Gastrointestinal= negative for nausea, vomiting,, abdominal pain, melena, hematemesis Genitourinary= negative for Dysuria, Hematuria, Change in Urinary Frequency MSK = Negative for myalgias Neurology= Negative for headache, seizures, numbness, tingling  Psychiatry= Negative for anxiety, depression, suicidal and homocidal ideation Allergy/Immunology= Medication/Food allergy as listed  Skin= Negative for Rash, lesions, ulcers, itching  Objective: Vitals:   04/18/18 2117 04/19/18 0038 04/19/18 0629 04/19/18 1101  BP: (!) 91/58 (!) 104/53 (!) 100/57   Pulse: (!) 58 63 63   Resp: 17 20 20  Temp: 98.7 F (37.1  C) 97.9 F (36.6 C) 98.4 F (36.9 C) 99.3 F (37.4 C)  TempSrc: Oral Oral Oral Oral  SpO2: 94% 96% 97%   Weight:   75 kg   Height:        Intake/Output Summary (Last 24 hours) at 04/19/2018 1141 Last data filed at 04/19/2018 0651 Gross per 24 hour  Intake 1465.85 ml  Output 475 ml  Net 990.85 ml   Filed Weights   04/17/18 0930 04/18/18 0511 04/19/18 0629  Weight: 74.8 kg 75.3 kg 75 kg    Examination:  Constitutional: NAD, calm, comfortable Eyes: PERRL, lids and conjunctivae normal ENMT: Mucous membranes are moist. Posterior pharynx clear of any exudate or lesions.Normal dentition.  Neck: normal, supple, no masses, no thyromegaly Respiratory: Slightly diminished breath sounds at the bases Cardiovascular: Regular rate and rhythm, no murmurs / rubs / gallops. No extremity edema. 2+ pedal pulses. No carotid bruits.  Abdomen: no tenderness, no masses palpated. No hepatosplenomegaly. Bowel sounds positive.  Musculoskeletal: Right knee and appears to be slightly swollen but no erythema noted.  Icepack in place.  Limited range of motion. Skin: no rashes, lesions, ulcers. No induration Neurologic: CN 2-12 grossly intact. Sensation intact, DTR normal. Strength 4/5 in all 4.  Psychiatric: Normal judgment and insight. Alert and oriented x 3. Normal mood.    Data Reviewed:   CBC: Recent Labs  Lab 04/16/18 1824 04/18/18 0735  WBC 6.5 10.6*  NEUTROABS 4.6  --   HGB 11.9* 11.7*  HCT 37.1* 35.9*  MCV 101.9* 100.0  PLT 248 742   Basic Metabolic Panel: Recent Labs  Lab 04/16/18 1824 04/17/18 0050 04/17/18 0254 04/18/18 0735 04/19/18 0413  NA 138  --  141 138 133*  K 4.1  --  3.9 3.8 4.3  CL 106  --  106 95* 96*  CO2 23  --  25 32 27  GLUCOSE 109*  --  113* 108* 138*  BUN 28*  --  29* 28* 40*  CREATININE 1.20 1.37* 1.34* 1.48* 2.54*  CALCIUM 8.7*  --  8.6* 8.7* 8.3*  MG 2.1  --   --   --  1.8   GFR: Estimated Creatinine Clearance: 16.4 mL/min (A) (by C-G formula based  on SCr of 2.54 mg/dL (H)). Liver Function Tests: Recent Labs  Lab 04/16/18 1824  AST 78*  ALT 72*  ALKPHOS 73  BILITOT 0.8  PROT 6.9  ALBUMIN 3.6   No results for input(s): LIPASE, AMYLASE in the last 168 hours. No results for input(s): AMMONIA in the last 168 hours. Coagulation Profile: No results for input(s): INR, PROTIME in the last 168 hours. Cardiac Enzymes: Recent Labs  Lab 04/16/18 1824 04/17/18 0050 04/17/18 0254 04/17/18 0856  TROPONINI 0.05* 0.07* 0.13* 0.28*   BNP (last 3 results) No results for input(s): PROBNP in the last 8760 hours. HbA1C: Recent Labs    04/17/18 0254  HGBA1C 5.3   CBG: No results for input(s): GLUCAP in the last 168 hours. Lipid Profile: Recent Labs    04/17/18 0254  CHOL 141  HDL 53  LDLCALC 80  TRIG 41  CHOLHDL 2.7   Thyroid Function Tests: Recent Labs    04/17/18 0254  TSH 3.997   Anemia Panel: No results for input(s): VITAMINB12, FOLATE, FERRITIN, TIBC, IRON, RETICCTPCT in the last 72 hours. Sepsis Labs: No results for input(s): PROCALCITON, LATICACIDVEN in the last 168 hours.  No results found for this or any previous visit (from the  past 240 hour(s)).       Radiology Studies: Dg Knee Right Port  Result Date: 04/18/2018 CLINICAL DATA:  Right knee pain. EXAM: PORTABLE RIGHT KNEE - 1-2 VIEW COMPARISON:  None. FINDINGS: There is medial joint space narrowing with moderate marginal osteophyte formation in the medial and patellofemoral compartments. There is a joint effusion with soft tissue swelling in the region of the distal quadriceps tendon. There is also calcification in the suprapatellar recess of the joint. Arterial vascular calcifications around the knee. IMPRESSION: Moderate osteoarthritis of the right knee with a joint effusion and soft tissue swelling in the region of the distal quadriceps tendon. Electronically Signed   By: Lorriane Shire M.D.   On: 04/18/2018 11:52        Scheduled Meds: . aspirin EC   81 mg Oral Daily  . bupivacaine  10 mL Infiltration Once  . calcium-vitamin D  1 tablet Oral Q breakfast  . cholecalciferol  2,000 Units Oral Daily  . [START ON 04/20/2018] enoxaparin (LOVENOX) injection  30 mg Subcutaneous Daily  . finasteride  5 mg Oral Daily  . flecainide  50 mg Oral Q12H  . methylPREDNISolone acetate  40 mg Intra-articular Once  . metoprolol tartrate  25 mg Oral BID  . omega-3 acid ethyl esters  1,000 mg Oral Daily  . sodium chloride flush  3 mL Intravenous Q12H  . vitamin B-12  1,000 mcg Oral Daily  . vitamin C  500 mg Oral Daily   Continuous Infusions: . sodium chloride    . cefTRIAXone (ROCEPHIN)  IV    . vancomycin    . [START ON 04/21/2018] vancomycin       LOS: 1 day   Time spent= 35 mins    Tomorrow Dehaas Arsenio Loader, MD [facility]  If 7PM-7AM, please contact night-coverage [email or web address] 04/19/2018, 11:41 AM

## 2018-04-19 NOTE — Progress Notes (Signed)
   04/19/18 0629  Vitals  Temp 98.4 F (36.9 C)  Temp Source Oral  BP (!) 100/57  MAP (mmHg) 70  BP Location Right Arm  BP Method Automatic  Patient Position (if appropriate) Lying  Pulse Rate 63  Resp 20  Oxygen Therapy  SpO2 97 %  O2 Device Nasal Cannula  O2 Flow Rate (L/min) 1 L/min  Height and Weight  Weight 75 kg  Type of Scale Used Standing  Type of Weight Actual  BMI (Calculated) 26.7  pt's BP is still low, pt is due for lasix iv this morning, Tylene Fantasia NP made aware and ordered to hold lasix.

## 2018-04-19 NOTE — Progress Notes (Signed)
Pharmacy Antibiotic Note  Travis Kelly is a 85 y.o. male admitted on 04/16/2018 with septic arthritis.  Pharmacy has been consulted for Vancomycin / Ceftriaxone dosing.  Plan: Vancomycin 1500 mg iv x 1 then 1000 mg iv Q 48 hours Ceftriaxone 2 grams iv Q 24 hours  Height: 5\' 6"  (167.6 cm) Weight: 165 lb 5.5 oz (75 kg) IBW/kg (Calculated) : 63.8  Temp (24hrs), Avg:98.1 F (36.7 C), Min:97.4 F (36.3 C), Max:98.7 F (37.1 C)  Recent Labs  Lab 04/16/18 1824 04/17/18 0050 04/17/18 0254 04/18/18 0735 04/19/18 0413  WBC 6.5  --   --  10.6*  --   CREATININE 1.20 1.37* 1.34* 1.48* 2.54*    Estimated Creatinine Clearance: 16.4 mL/min (A) (by C-G formula based on SCr of 2.54 mg/dL (H)).    Allergies  Allergen Reactions  . Barbital Rash and Other (See Comments)    Blue lips and swollen tongue     Thank you for allowing pharmacy to be a part of this patient's care. Anette Guarneri, PharmD [phone] 04/19/2018 10:43 AM

## 2018-04-19 NOTE — Clinical Social Work Note (Signed)
Clinical Social Work Assessment  Patient Details  Name: Travis Kelly MRN: [id number] Date of Birth: Jul 04, 1922  Date of referral:  04/19/18               Reason for consult:  Facility Placement, Discharge Planning                Permission sought to share information with:  Facility Sport and exercise psychologist, Family Supports Permission granted to share information::  Yes, Verbal Permission Granted  Name::     Travis Kelly  Agency::  [facility]  Relationship::  Daughter. Granddaughter at bedside and other daughter on speakerphone.  Contact Information:  [phone]  Housing/Transportation Living arrangements for the past 2 months:  [place] of Information:  Patient, Medical Team, [place], Other (Comment Required)(Granddaughter) Patient Interpreter Needed:  None Criminal Activity/Legal Involvement Pertinent to Current Situation/Hospitalization:  No - Comment as needed Significant Relationships:  Adult Children, Other Family Members, Spouse Lives with:  Spouse Do you feel safe going back to the place where you live?  Yes Need for family participation in patient care:  Yes (Comment)  Care giving concerns:  Patient is a resident at [facility]. PT recommending SNF placement once medically stable for discharge.   Social Worker assessment / plan:  CSW met with patient. Daughter and granddaughter at bedside. Other daughter on speakerphone. CSW introduced role and explained that PT recommendations would be discussed. Patient confirmed he lives at [facility] with his wife. He would like to return there with home health rather than SNF. Patient reports "I hate them" in reference to SNF. Patient gets home health with [facility] twice per week. Daughter interested in increasing frequency once coming out of the hospital. They are also interested in getting more assistance for patient's spouse since he will be unable to assist her as much as before. CSW  called Travis Kelly, liaison with [facility]. She encouraged daughter to call her to discuss home health needs. CSW provided daughter with her phone number. RNCM is aware of home health preference. No further concerns. CSW signing off as social work intervention is no longer needed.   Employment status:  Retired Nurse, adult PT Recommendations:  [place] / Referral to community resources:  [place]  Patient/Family's Response to care:  Patient and his family prefer to return to [facility] with home health. Patient's family supportive and involved in patient's care. Patient and his family appreciated social work intervention.  Patient/Family's Understanding of and Emotional Response to Diagnosis, Current Treatment, and Prognosis:  Patient and his family have a good understanding of the reason for admission and his need for continued therapy after discharge. Patient and his family appear happy with hospital care.  Emotional Assessment Appearance:  Appears stated age Attitude/Demeanor/Rapport:  Engaged, Gracious Affect (typically observed):  Appropriate, Calm, Pleasant Orientation:  Oriented to Self, Oriented to Place, Oriented to  Time, Oriented to Situation Alcohol / Substance use:  Never Used Psych involvement (Current and /or in the community):  No (Comment)  Discharge Needs  Concerns to be addressed:  Care Coordination Readmission within the last 30 days:  No Current discharge risk:  Dependent with Mobility Barriers to Discharge:  Continued Medical Work up   Travis Chroman, LCSW 04/19/2018, 3:45 PM

## 2018-04-20 ENCOUNTER — Ambulatory Visit: Payer: Medicare HMO | Admitting: Cardiology

## 2018-04-20 ENCOUNTER — Inpatient Hospital Stay (HOSPITAL_COMMUNITY): Payer: No Typology Code available for payment source

## 2018-04-20 LAB — BASIC METABOLIC PANEL
ANION GAP: 11 (ref 5–15)
BUN: 52 mg/dL — ABNORMAL HIGH (ref 8–23)
CO2: 25 mmol/L (ref 22–32)
Calcium: 8.5 mg/dL — ABNORMAL LOW (ref 8.9–10.3)
Chloride: 94 mmol/L — ABNORMAL LOW (ref 98–111)
Creatinine, Ser: 2.35 mg/dL — ABNORMAL HIGH (ref 0.61–1.24)
GFR calc Af Amer: 27 mL/min — ABNORMAL LOW (ref >60)
GFR calc non Af Amer: 23 mL/min — ABNORMAL LOW (ref >60)
Glucose, Bld: 146 mg/dL — ABNORMAL HIGH (ref 70–99)
Potassium: 4.2 mmol/L (ref 3.5–5.1)
Sodium: 130 mmol/L — ABNORMAL LOW (ref 135–145)

## 2018-04-20 LAB — CBC
HCT: 35.2 % — ABNORMAL LOW (ref 39.0–52.0)
Hemoglobin: 11.7 g/dL — ABNORMAL LOW (ref 13.0–17.0)
MCH: 32.7 pg (ref 26.0–34.0)
MCHC: 33.2 g/dL (ref 30.0–36.0)
MCV: 98.3 fL (ref 80.0–100.0)
Platelets: 244 10*3/uL (ref 150–400)
RBC: 3.58 MIL/uL — ABNORMAL LOW (ref 4.22–5.81)
RDW: 13.6 % (ref 11.5–15.5)
WBC: 10.6 10*3/uL — ABNORMAL HIGH (ref 4.0–10.5)
nRBC: 0 % (ref 0.0–0.2)

## 2018-04-20 LAB — GLUCOSE, CAPILLARY: Glucose-Capillary: 129 mg/dL — ABNORMAL HIGH (ref 70–99)

## 2018-04-20 LAB — MAGNESIUM: Magnesium: 2 mg/dL (ref 1.7–2.4)

## 2018-04-20 MED ORDER — TRAMADOL HCL 50 MG PO TABS: 50.0000 mg | ORAL_TABLET | Freq: Four times a day (QID) | ORAL | 0 refills | Status: AC | PRN

## 2018-04-20 MED ORDER — METOCLOPRAMIDE HCL 5 MG/ML IJ SOLN
10.0000 mg | Freq: Once | INTRAMUSCULAR | Status: AC
  Administered 2018-04-20: 10 mg via INTRAVENOUS
  Filled 2018-04-20: qty 2

## 2018-04-20 MED ORDER — ASPIRIN 81 MG PO TBEC: 81.0000 mg | DELAYED_RELEASE_TABLET | Freq: Every day | ORAL | 0 refills | Status: AC

## 2018-04-20 MED ORDER — FLECAINIDE ACETATE 50 MG PO TABS: 50.0000 mg | ORAL_TABLET | Freq: Two times a day (BID) | ORAL | 0 refills | Status: DC

## 2018-04-20 MED ORDER — METOPROLOL TARTRATE 25 MG PO TABS: 25.0000 mg | ORAL_TABLET | Freq: Two times a day (BID) | ORAL | 0 refills | Status: DC

## 2018-04-20 MED FILL — ASPIRIN LOW DOSE 81 MG TBEC: 81 | 30 days supply | Qty: 30 | Fill #0

## 2018-04-20 MED FILL — FLECAINIDE ACETATE 50 MG TA: 50 | 30 days supply | Qty: 60 | Fill #0

## 2018-04-20 NOTE — Progress Notes (Signed)
Physical Therapy Treatment Patient Details Name: Travis Kelly MRN: [id number] DOB: Sep 21, 1922 Today's Date: 04/20/2018    History of Present Illness Pt is a 85 y.o. male admitted from [facility] on 04/16/18 with c/o palpitation. Pt found to have paroxysmal afib reolved with vagal maneuvers; also noted to be in fluid overload, worked up for CHF. Pt reports significant R knee swelling, imaging showed joint effusion; s/p R knee aspiration and injection 1/8. PMH includes CHF, CKD III, MVR, paroxysmal SVT.   PT Comments    Pt progressing with mobility. Initially requiring modA to stand, progressing to min guard with RW when pt standing from surface with sturdy UE support. Ambulatory with RW and min guard for balance; intermittent instability, no overt LOB. Daughter present and supportive. Pt remains motivated to participate in order to return to PLOF. Will continue to follow acutely.  SpO2 95% on RA    Follow Up Recommendations  SNF;Supervision for mobility/OOB     Equipment Recommendations  None recommended by PT    Recommendations for Other Services       Precautions / Restrictions Precautions Precautions: Fall Restrictions Weight Bearing Restrictions: No    Mobility  Bed Mobility Overal bed mobility: Modified Independent             General bed mobility comments: HOB elevated, increased time and effort; use of bed rail  Transfers Overall transfer level: Needs assistance Equipment used: Rolling walker (2 wheeled) Transfers: Sit to/from Stand Sit to Stand: Mod assist;Min guard         General transfer comment: Increased time attempting standing while seated EOB, eventually requiring modA from elevated bed height. Post-ambulation, pt able to perform sit<>stand from recliner with min guard; heavy reliance on UE support to push into standing  Ambulation/Gait Ambulation/Gait assistance: Min guard Gait Distance (Feet): 100 Feet Assistive device: Rolling walker (2 wheeled) Gait  Pattern/deviations: Step-through pattern;Decreased stride length;Trunk flexed Gait velocity: Decreased Gait velocity interpretation: <1.8 ft/sec, indicate of risk for recurrent falls General Gait Details: Slow, slightly antalgic gait with RW and intermittent min guard for balance; instability noted with turns, but no overt LOB.   Stairs             Wheelchair Mobility    Modified Rankin (Stroke Patients Only)       Balance Overall balance assessment: Needs assistance Sitting-balance support: No upper extremity supported;Feet supported Sitting balance-Leahy Scale: Fair     Standing balance support: Bilateral upper extremity supported;During functional activity;Single extremity supported Standing balance-Leahy Scale: Poor Standing balance comment: Reliant on at least single UE support for dynamic standing balance                            Cognition Arousal/Alertness: Awake/alert Behavior During Therapy: WFL for tasks assessed/performed Overall Cognitive Status: Within Functional Limits for tasks assessed                                 General Comments: Per daughter, pt woke up disoriented overnight. Today, pt initially thinking he was in apartment at [facility], but able to self correct saying, "No, this is the hospital." [facility] for simple tasks; some slowed processing noted, likely baseline cognition      Exercises      General Comments General comments (skin integrity, edema, etc.): Daughter present and supportive      Pertinent Vitals/Pain Pain Assessment: Faces Faces Pain  Scale: Hurts a little bit Pain Location: R knee Pain Descriptors / Indicators: Sore Pain Intervention(s): Monitored during session    Home Living                      Prior Function            PT Goals (current goals can now be found in the care plan section) Acute Rehab PT Goals Patient Stated Goal: Get stronger to return home PT Goal Formulation:  With patient/family Time For Goal Achievement: 05/03/18 Potential to Achieve Goals: Good Progress towards PT goals: Progressing toward goals    Frequency    Min 2X/week      PT Plan Current plan remains appropriate    Co-evaluation              AM-PAC PT "6 Clicks" Mobility   Outcome Measure  Help needed turning from your back to your side while in a flat bed without using bedrails?: None Help needed moving from lying on your back to sitting on the side of a flat bed without using bedrails?: None Help needed moving to and from a bed to a chair (including a wheelchair)?: A Lot Help needed standing up from a chair using your arms (e.g., wheelchair or bedside chair)?: A Little Help needed to walk in hospital room?: A Little Help needed climbing 3-5 steps with a railing? : A Lot 6 Click Score: 18    End of Session Equipment Utilized During Treatment: Gait belt Activity Tolerance: Patient tolerated treatment well Patient left: in chair;with call bell/phone within reach;with family/visitor present;with chair alarm set Nurse Communication: Mobility status PT Visit Diagnosis: Other abnormalities of gait and mobility (R26.89);Muscle weakness (generalized) (M62.81)     Time: 6333-5456 PT Time Calculation (min) (ACUTE ONLY): 26 min  Charges:  $Gait Training: 8-22 mins $Therapeutic Activity: 8-22 mins                    Mabeline Caras, PT, DPT Acute Rehabilitation Services  Pager [phone] Office [place] 04/20/2018, 8:55 AM

## 2018-04-20 NOTE — Care Management Important Message (Signed)
Important Message  Patient Details  Name: ZALYN AMEND MRN: [id number] Date of Birth: 02-08-1923   Medicare Important Message Given:  Yes    Quinzell Malcomb P Naylor 04/20/2018, 11:48 AM

## 2018-04-20 NOTE — Progress Notes (Addendum)
Subjective: Follow up for R knee pain  Reports much improved today. Feels he is ambulating better with it. No other c/o regarding the knee. No fever or chills. Denies any pain.   Objective: Vital signs in last 24 hours: Temp:  [97.9 F (36.6 C)-99.7 F (37.6 C)] 98.7 F (37.1 C) (01/10 0441) Pulse Rate:  [59-82] 82 (01/10 0441) Resp:  [18-20] 20 (01/10 0441) BP: (100-130)/(47-71) 107/64 (01/10 0441) SpO2:  [93 %-99 %] 94 % (01/10 0441) Weight:  [75 kg] 75 kg (01/10 0356)  Intake/Output from previous day: 01/09 0701 - 01/10 0700 In: 240 [P.O.:240] Out: 500 [Urine:500] Intake/Output this shift: Total I/O In: -  Out: 150 [Urine:150]  Recent Labs    04/18/18 0735 04/20/18 0424  HGB 11.7* 11.7*   Recent Labs    04/18/18 0735 04/20/18 0424  WBC 10.6* 10.6*  RBC 3.59* 3.58*  HCT 35.9* 35.2*  PLT 237 244   Recent Labs    04/19/18 0413 04/20/18 0424  NA 133* 130*  K 4.3 4.2  CL 96* 94*  CO2 27 25  BUN 40* 52*  CREATININE 2.54* 2.35*  GLUCOSE 138* 146*  CALCIUM 8.3* 8.5*   No results for input(s): LABPT, INR in the last 72 hours.  Neurologically intact ABD soft Neurovascular intact Sensation intact distally Intact pulses distally Dorsiflexion/Plantar flexion intact No cellulitis present Compartment soft R knee nontender. No erythema or ecchymosis. No increased warmth of suggestion of infection.  No pain with PROM of the knee. Minimal swelling at this point.    Assessment/Plan: R knee pseudogout  Aspirate consistent with pseudogout, no concern for infection currently especially with response to aspiration and cortisone injection Cultures with no growth but final cx is still pending- will watch through weekend to be sure no growth otherwise may need to consider washout Discussed with Dr. Tonita Cong Follow up outpatient as needed if reoccurs   Cecilie Kicks 04/20/2018, 9:18 AM

## 2018-04-20 NOTE — Progress Notes (Signed)
   04/20/18 0204  Vitals  Temp 98 F (36.7 C)  Temp Source Oral  BP (!) 105/59  MAP (mmHg) 72  BP Method Automatic  Patient Position (if appropriate) Lying  Pulse Rate 60  Pulse Rate Source Monitor  Resp 19  Oxygen Therapy  SpO2 93 %  O2 Device Nasal Cannula  O2 Flow Rate (L/min) 1 L/min  Pt is diaphoretic and vommitted about 50cc of brownish vomitus, vitals checked, pt's CBG=129. Alger Memos  NP notified, awaiting call back.

## 2018-04-20 NOTE — Progress Notes (Addendum)
Patient plans to return to [facility] at discharge; CM called [facility], they are aware of the discharge home today; orders to be faxed to [phone] for resumption of services; CM talked to daughter [name], she stated that she will contact the [place] for a hospital bed and scooter; Aneta Mins [phone]

## 2018-04-20 NOTE — Discharge Summary (Signed)
Physician Discharge Summary  Travis Kelly ZSW:[id number] DOB: 1922/08/25 DOA: 04/16/2018  PCP: [name], MD  Admit date: 04/16/2018 Discharge date: 04/20/2018  Admitted From: Home Disposition: Assisted living with PT.  Patient does not want to go to rehabilitation.  Recommendations for Outpatient Follow-up:  1. Follow up with PCP in 1 weeks 2. Please obtain BMP/CBC in 3 days your next doctors visit.  3. Flecainide 50 mg twice daily started.  Advised to follow-up outpatient with cardiology appointment has been made.  Will need repeat EKG during follow-up with outpatient primary care 4. Advised to hold off on Lasix until outpatient lab work has been done and further instructions will be given by primary care provider 5. Take oral tramadol as needed for pain. 6. Take metoprolol 25 mg twice daily. 7. Follow outpatient with orthopedic as needed.  Home Health: None Equipment/Devices: None Discharge Condition: Stable CODE STATUS: Full code Diet recommendation: Heart healthy  Brief/Interim Summary: 85 year old with history of mitral valve regurgitation, paroxysmal SVT, BPH, diastolic CHF, CKD stage III came to the hospital with complains of palpitation.  Patient was found to be in paroxysmal atrial fibrillation which resolved with vagal maneuvers but he was also noted to be fluid overloaded during the hospitalization.  Patient was started on IV Lasix 40 mg twice daily and over the course of couple of days became more euvolemic.  No further episodes of SVT were noted. Patient reported right lower extremity swelling secondary to fall about a week ago.  Denies any previous history of gout.  X-ray showed joint effusion therefore orthopedic was consulted. Synovial Fluids showed elevated WBC concerning for septic arthritis.   Initially patient was started on vancomycin and Rocephin but the following day Gram stain was negative for any organisms.  No organisms were seen on synovial fluid as well  therefore it was all likely secondary to inflammatory crystalline arthropathy.  Case was discussed with Dr. Megan Salon from infectious disease, antibiotics were discontinued.  It was recommended to control the pain and can follow-up outpatient with primary care physician and orthopedics as needed. During the hospitalization patient's creatinine also increased from 1.5-2.5 but then started trending down to 2.3.  This was thought secondary to 1 episode of hypotension and dehydration therefore received gentle hydration.  On the day of discharge, patient did not want to stay back in the hospital and insisted to go home, patient's daughter want this as well.  I clearly instructed the patient that he needs to follow-up outpatient with primary care provider in the next 3-4 days and get repeat lab work to ensure renal function continues to improve.  He understands this.  I also instructed him to hold off on his Lasix until seen by primary care provider.  Further recommendations to be given depending on his lab work. At this point patient has reached max and benefit from hospital stay and stable to be discharged with outpatient follow-up recommendations as stated above.   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 -  Unlikely this is septic arthritis in the morning as there is no  organisms seen, patient has no leukocytosis, knee does not appear to be erythematous. -Status post drainage by Ortho on 1/8.  Gram stain and synovial fluid-no  organisms seen.  Case was discussed with Dr. Megan Salon from infectious disease.  Will discontinue antibiotics and treat pain.  He will need to follow-up outpatient as needed with primary care provider and orthopedic. -Patient requested tramadol for pain control therefore prescription given.  AKI on CKD Stage III - Renal function has slowly improved from 2.5 to 2.3.  Renal ultrasound is negative.  Urine electrolytes show slight dehydration.  I have advised patient to follow-up outpatient with primary care provider in the next 2-3 days and get repeat lab work in the meantime hold off on Lasix.  Chronic diastolic congestive heart failure, class II -Denies any chest pain.  Holding off on Lasix until seen by outpatient primary care provider. -Follow outpatient with cardiology.  Paroxysmal SVT -This is resolved with vagal maneuver.  Metoprolol reduced to 25 mg twice daily.Flecainide 50 mg twice daily started  On Lovenox for DVT prophylaxis while here Daughter is at the bedside Discharge today in stable condition.  Discharge Instructions   Allergies as of 04/20/2018      Reactions   Barbital Rash, Other (See Comments)   Blue lips and swollen tongue      Medication List    TAKE these medications   acetaminophen 500 MG tablet Commonly known as:  TYLENOL Take 1,000 mg by mouth daily as needed for mild pain.   aspirin 81 MG EC tablet Take 1 tablet (81 mg total) by mouth daily.   CALCIUM-MAGNESIUM-ZINC-D3 PO Take 1 tablet by mouth daily.   finasteride 5 MG tablet Commonly known as:  PROSCAR Take 5 mg by mouth daily.   flecainide 50 MG tablet Commonly known as:  TAMBOCOR Take 1 tablet (50 mg total) by mouth every 12 (twelve) hours.   fluticasone 50 MCG/ACT nasal spray Commonly known as:  FLONASE Place 1-2 sprays into both nostrils daily as needed for allergies or rhinitis.   furosemide 20 MG tablet Commonly known as:  LASIX TAKE 1 TABLET EVERY DAY   Glucosamine HCl 1000 MG  Tabs Take 1 tablet by mouth daily.   hydroxypropyl methylcellulose / hypromellose 2.5 % ophthalmic solution Commonly known as:  ISOPTO TEARS / GONIOVISC Place 1 drop into both eyes 2 (two) times daily.   Lutein 6 MG Caps Take 1 capsule by mouth daily.   meloxicam 15 MG tablet Commonly known as:  MOBIC Take 15 mg by mouth daily as needed for pain.   metoprolol tartrate 25 MG tablet Commonly known as:  LOPRESSOR Take 1 tablet (25 mg total) by mouth 2 (two) times daily. What changed:  when to take this   multivitamin tablet Take 1 tablet by mouth daily.   naproxen 375 MG tablet Commonly known as:  NAPROSYN Take 375 mg by mouth daily as needed for mild pain.   OCUVITE EYE HEALTH FORMULA Caps Take 1 tablet by mouth daily.   OVER THE COUNTER MEDICATION 1 Container by Mouth Rinse route 2 (two) times daily. [employer] Peroxyl   RA KRILL OIL 500 MG Caps Take 500 mg by mouth daily.   traMADol 50 MG tablet Commonly known as:  ULTRAM Take 1 tablet (50 mg total) by mouth every 6 (six) hours as needed for severe pain.   Turmeric Curcumin Caps Take 1 capsule by mouth daily.   VITAMIN B 12 PO Take 5,000 mg by mouth daily.  vitamin C 500 MG tablet Commonly known as:  ASCORBIC ACID Take 500 mg by mouth daily.   Vitamin D 50 MCG (2000 UT) Caps Take 2,000 Units by mouth daily.      Follow-up Information    Erlene Quan, PA-C Follow up.   Specialties:  Cardiology, Radiology Why:  04/20/2018 @ 11:30AM Contact information: [address] [place] [address] [phone]        Thompson Grayer, MD Follow up.   Specialty:  Cardiology Why:  05/21/2018 @ 11:30AM Contact information: [place] [address] [phone]        [name], MD. Schedule an appointment as soon as possible for a visit in 1 week(s).   Specialty:  Internal Medicine Contact information: [place] [address] [phone]           Allergies  Allergen Reactions  . Barbital Rash and Other (See Comments)    Blue lips and swollen tongue    You were cared for by a hospitalist during your hospital stay. If you have any questions about your discharge medications or the care you received while you were in the hospital after you are discharged, you can call the unit and asked to speak with the hospitalist on call if the hospitalist that took care of you is not available. Once you are discharged, your primary care physician will handle any further medical issues. Please note that no refills for any discharge medications will be authorized once you are discharged, as it is imperative that you return to your primary care physician (or establish a relationship with a primary care physician if you do not have one) for your aftercare needs so that they can reassess your need for medications and monitor your lab values.  Consultations:  Orthopedic  Cardiology   Procedures/Studies: US Renal  Result Date: 04/19/2018 CLINICAL DATA:  Acute kidney injury, history nephrolithiasis, CHF, stage III chronic kidney disease EXAM: RENAL / URINARY TRACT ULTRASOUND COMPLETE COMPARISON:  None FINDINGS: Right Kidney: Renal measurements: 10.5 x 5.3 x 5.5 cm = volume: 159.2 mL. Cortical thinning. Increased cortical echogenicity. Small cyst at upper pole, 2.2 x 2.0 x 2.5 cm. No additional mass, hydronephrosis or shadowing calcification. Left Kidney: Renal measurements: 11.4 x 5.3 x 5.0 cm = volume: 159.6 mL. Cortical thinning. Increased cortical echogenicity. Simple cyst at at inferior pole 6.2 x 6.3 x 4.3 cm. Additional cyst at inferior pole 2.6 x 2.3 x 2.7 cm. Adjacent tiny cysts 14 x 7 x 17 mm. No hydronephrosis or solid mass. No shadowing calculi. Bladder: Appears normal for degree of bladder distention. Incidentally noted enlargement of prostate gland 5.0 x 5.6 x 4.0 cm. Incidentally noted cholelithiasis. IMPRESSION: BILATERAL renal cysts. Medical  renal disease changes of both kidneys. Prostatic enlargement. Cholelithiasis. Electronically Signed   By: Lavonia Dana M.D.   On: 04/19/2018 15:04   Dg Chest Port 1 View  Result Date: 04/16/2018 CLINICAL DATA:  Supraventricular tachycardia.  No chest pain. EXAM: PORTABLE CHEST 1 VIEW COMPARISON:  CT 07/05/2016.  Radiographs 07/04/2016. FINDINGS: 1734 hours. There are lower lung volumes with mild bibasilar atelectasis and a probable small left pleural effusion. There is vascular congestion without overt pulmonary edema. Cardiomegaly and aortic atherosclerosis are stable. There is no pneumothorax or confluent airspace opacity. No acute osseous findings are evident. Telemetry leads overlie the chest. IMPRESSION: Cardiomegaly, vascular congestion and small left pleural effusion. No overt pulmonary edema. Electronically Signed   By:  Richardean Sale M.D.   On: 04/16/2018 17:54   Dg Knee Right Port  Result Date: 04/18/2018 CLINICAL DATA:  Right knee pain. EXAM: PORTABLE RIGHT KNEE - 1-2 VIEW COMPARISON:  None. FINDINGS: There is medial joint space narrowing with moderate marginal osteophyte formation in the medial and patellofemoral compartments. There is a joint effusion with soft tissue swelling in the region of the distal quadriceps tendon. There is also calcification in the suprapatellar recess of the joint. Arterial vascular calcifications around the knee. IMPRESSION: Moderate osteoarthritis of the right knee with a joint effusion and soft tissue swelling in the region of the distal quadriceps tendon. Electronically Signed   By: Lorriane Shire M.D.   On: 04/18/2018 11:52   Dg Abd Portable 1v  Result Date: 04/20/2018 CLINICAL DATA:  85 y/o  M; nausea and vomiting tonight. EXAM: PORTABLE ABDOMEN - 1 VIEW COMPARISON:  None. FINDINGS: The bowel gas pattern is normal. No radio-opaque calculi or other significant radiographic abnormality are seen. Advanced multilevel degenerative changes of the lumbar spine and  mild lumbar levocurvature. Mild left-sided hip osteoarthrosis with periarticular osteophytosis. IMPRESSION: Negative. Electronically Signed   By: Kristine Garbe M.D.   On: 04/20/2018 04:53      Subjective: Had an extensive discussion with the patient and his daughter at bedside.  Patient wants to go home today.  He understands his renal function is only slowly improved but he states he will follow-up outpatient with all the instructions provided.   Discharge Exam: Vitals:   04/20/18 0356 04/20/18 0441  BP: 130/66 107/64  Pulse: 63 82  Resp: 20 20  Temp: 97.9 F (36.6 C) 98.7 F (37.1 C)  SpO2: 99% 94%   Vitals:   04/19/18 2327 04/20/18 0204 04/20/18 0356 04/20/18 0441  BP: 107/71 (!) 105/59 130/66 107/64  Pulse: (!) 59 60 63 82  Resp:  19 20 20   Temp:  98 F (36.7 C) 97.9 F (36.6 C) 98.7 F (37.1 C)  TempSrc:  Oral Oral Oral  SpO2: 95% 93% 99% 94%  Weight:   75 kg   Height:        General: Pt is alert, awake, not in acute distress Cardiovascular: RRR, S1/S2 +, no rubs, no gallops Respiratory: CTA bilaterally, no wheezing, no rhonchi Abdominal: Soft, NT, ND, bowel sounds + Extremities: no edema, no cyanosis Right knee slightly swollen but no erythema noted.  He is able to flex more.   The results of significant diagnostics from this hospitalization (including imaging, microbiology, ancillary and laboratory) are listed below for reference.     Microbiology: Recent Results (from the past 240 hour(s))  Body fluid culture     Status: None (Preliminary result)   Collection Time: 04/19/18  7:38 AM  Result Value Ref Range Status   Specimen Description KNEE RIGHT  Final   Special Requests NONE  Final   Gram Stain   Final    ABUNDANT WBC PRESENT, PREDOMINANTLY PMN NO ORGANISMS SEEN Performed at [facility], [address] [place], [place] [address]    Culture PENDING  Incomplete   Report Status PENDING  Incomplete     Labs: BNP (last 3  results) Recent Labs    04/16/18 1824  BNP 8,676.7*   Basic Metabolic Panel: Recent Labs  Lab 04/16/18 1824 04/17/18 0050 04/17/18 0254 04/18/18 0735 04/19/18 0413 04/20/18 0424  NA 138  --  141 138 133* 130*  K 4.1  --  3.9 3.8 4.3 4.2  CL  106  --  106 95* 96* 94*  CO2 23  --  25 32 27 25  GLUCOSE 109*  --  113* 108* 138* 146*  BUN 28*  --  29* 28* 40* 52*  CREATININE 1.20 1.37* 1.34* 1.48* 2.54* 2.35*  CALCIUM 8.7*  --  8.6* 8.7* 8.3* 8.5*  MG 2.1  --   --   --  1.8 2.0   Liver Function Tests: Recent Labs  Lab 04/16/18 1824  AST 78*  ALT 72*  ALKPHOS 73  BILITOT 0.8  PROT 6.9  ALBUMIN 3.6   No results for input(s): LIPASE, AMYLASE in the last 168 hours. No results for input(s): AMMONIA in the last 168 hours. CBC: Recent Labs  Lab 04/16/18 1824 04/18/18 0735 04/20/18 0424  WBC 6.5 10.6* 10.6*  NEUTROABS 4.6  --   --   HGB 11.9* 11.7* 11.7*  HCT 37.1* 35.9* 35.2*  MCV 101.9* 100.0 98.3  PLT 248 237 244   Cardiac Enzymes: Recent Labs  Lab 04/16/18 1824 04/17/18 0050 04/17/18 0254 04/17/18 0856  TROPONINI 0.05* 0.07* 0.13* 0.28*   BNP: Invalid input(s): POCBNP CBG: Recent Labs  Lab 04/20/18 0159  GLUCAP 129*   D-Dimer No results for input(s): DDIMER in the last 72 hours. Hgb A1c No results for input(s): HGBA1C in the last 72 hours. Lipid Profile No results for input(s): CHOL, HDL, LDLCALC, TRIG, CHOLHDL, LDLDIRECT in the last 72 hours. Thyroid function studies No results for input(s): TSH, T4TOTAL, T3FREE, THYROIDAB in the last 72 hours.  Invalid input(s): FREET3 Anemia work up No results for input(s): VITAMINB12, FOLATE, FERRITIN, TIBC, IRON, RETICCTPCT in the last 72 hours. Urinalysis    Component Value Date/Time   COLORURINE AMBER (A) 02/19/2018 1548   APPEARANCEUR HAZY (A) 02/19/2018 1548   LABSPEC 1.019 02/19/2018 1548   PHURINE 5.0 02/19/2018 1548   GLUCOSEU NEGATIVE 02/19/2018 1548   HGBUR NEGATIVE 02/19/2018 1548    BILIRUBINUR NEGATIVE 02/19/2018 1548   KETONESUR NEGATIVE 02/19/2018 1548   PROTEINUR NEGATIVE 02/19/2018 1548   NITRITE NEGATIVE 02/19/2018 1548   LEUKOCYTESUR TRACE (A) 02/19/2018 1548   Sepsis Labs Invalid input(s): PROCALCITONIN,  WBC,  LACTICIDVEN Microbiology Recent Results (from the past 240 hour(s))  Body fluid culture     Status: None (Preliminary result)   Collection Time: 04/19/18  7:38 AM  Result Value Ref Range Status   Specimen Description KNEE RIGHT  Final   Special Requests NONE  Final   Gram Stain   Final    ABUNDANT WBC PRESENT, PREDOMINANTLY PMN NO ORGANISMS SEEN Performed at [facility], [place] [address], [place], [place] [address]    Culture PENDING  Incomplete   Report Status PENDING  Incomplete     Time coordinating discharge:  I have spent 35 minutes face to face with the patient and on the ward discussing the patients care, assessment, plan and disposition with other care givers. >50% of the time was devoted counseling the patient about the risks and benefits of treatment/Discharge disposition and coordinating care.   SIGNED:   Damita Lack, MD  [facility] 04/20/2018, 9:28 AM Pager   If 7PM-7AM, please contact night-coverage [email or web address] Password [email or web address]

## 2018-04-20 NOTE — Progress Notes (Signed)
Pt vomited again about 75cc dark brown emesis. Alger Memos NP notified, awaiting call back.

## 2018-04-20 NOTE — Progress Notes (Signed)
Discussed discharge with pt and daughter including medications and need to follow up with labs and PCP.   Discussed with MD and Daughter- will hold lasix until lab work is drawn Monday or Tuesday of next week and pt talks to PCP.

## 2018-04-20 NOTE — Progress Notes (Signed)
Alger Memos NP ordered Stat  abd xray, reglan 10mg  IV, CBC stat.

## 2018-04-22 LAB — BODY FLUID CULTURE: Culture: NO GROWTH

## 2018-04-23 ENCOUNTER — Encounter: Payer: Self-pay | Admitting: Cardiology

## 2018-04-23 DIAGNOSIS — Z6828 Body mass index (BMI) 28.0-28.9, adult: Secondary | ICD-10-CM | POA: Diagnosis not present

## 2018-04-23 DIAGNOSIS — R278 Other lack of coordination: Secondary | ICD-10-CM | POA: Diagnosis not present

## 2018-04-23 DIAGNOSIS — I5032 Chronic diastolic (congestive) heart failure: Secondary | ICD-10-CM | POA: Diagnosis not present

## 2018-04-23 DIAGNOSIS — I472 Ventricular tachycardia: Secondary | ICD-10-CM | POA: Diagnosis not present

## 2018-04-23 DIAGNOSIS — R262 Difficulty in walking, not elsewhere classified: Secondary | ICD-10-CM | POA: Diagnosis not present

## 2018-04-23 DIAGNOSIS — R2681 Unsteadiness on feet: Secondary | ICD-10-CM | POA: Diagnosis not present

## 2018-04-23 DIAGNOSIS — I471 Supraventricular tachycardia: Secondary | ICD-10-CM | POA: Diagnosis not present

## 2018-04-23 DIAGNOSIS — M6281 Muscle weakness (generalized): Secondary | ICD-10-CM | POA: Diagnosis not present

## 2018-04-24 DIAGNOSIS — I472 Ventricular tachycardia: Secondary | ICD-10-CM | POA: Diagnosis not present

## 2018-04-24 DIAGNOSIS — M6281 Muscle weakness (generalized): Secondary | ICD-10-CM | POA: Diagnosis not present

## 2018-04-24 DIAGNOSIS — R262 Difficulty in walking, not elsewhere classified: Secondary | ICD-10-CM | POA: Diagnosis not present

## 2018-04-24 DIAGNOSIS — R278 Other lack of coordination: Secondary | ICD-10-CM | POA: Diagnosis not present

## 2018-04-24 DIAGNOSIS — R2681 Unsteadiness on feet: Secondary | ICD-10-CM | POA: Diagnosis not present

## 2018-04-24 LAB — ANAEROBIC CULTURE: Special Requests: NORMAL

## 2018-04-25 DIAGNOSIS — R278 Other lack of coordination: Secondary | ICD-10-CM | POA: Diagnosis not present

## 2018-04-25 DIAGNOSIS — R262 Difficulty in walking, not elsewhere classified: Secondary | ICD-10-CM | POA: Diagnosis not present

## 2018-04-25 DIAGNOSIS — R2681 Unsteadiness on feet: Secondary | ICD-10-CM | POA: Diagnosis not present

## 2018-04-25 DIAGNOSIS — M6281 Muscle weakness (generalized): Secondary | ICD-10-CM | POA: Diagnosis not present

## 2018-04-25 DIAGNOSIS — I472 Ventricular tachycardia: Secondary | ICD-10-CM | POA: Diagnosis not present

## 2018-04-26 DIAGNOSIS — I472 Ventricular tachycardia: Secondary | ICD-10-CM | POA: Diagnosis not present

## 2018-04-26 DIAGNOSIS — M6281 Muscle weakness (generalized): Secondary | ICD-10-CM | POA: Diagnosis not present

## 2018-04-26 DIAGNOSIS — R262 Difficulty in walking, not elsewhere classified: Secondary | ICD-10-CM | POA: Diagnosis not present

## 2018-04-26 DIAGNOSIS — R278 Other lack of coordination: Secondary | ICD-10-CM | POA: Diagnosis not present

## 2018-04-26 DIAGNOSIS — R2681 Unsteadiness on feet: Secondary | ICD-10-CM | POA: Diagnosis not present

## 2018-04-27 DIAGNOSIS — R262 Difficulty in walking, not elsewhere classified: Secondary | ICD-10-CM | POA: Diagnosis not present

## 2018-04-27 DIAGNOSIS — I472 Ventricular tachycardia: Secondary | ICD-10-CM | POA: Diagnosis not present

## 2018-04-27 DIAGNOSIS — R278 Other lack of coordination: Secondary | ICD-10-CM | POA: Diagnosis not present

## 2018-04-27 DIAGNOSIS — M6281 Muscle weakness (generalized): Secondary | ICD-10-CM | POA: Diagnosis not present

## 2018-04-27 DIAGNOSIS — R2681 Unsteadiness on feet: Secondary | ICD-10-CM | POA: Diagnosis not present

## 2018-04-30 DIAGNOSIS — R278 Other lack of coordination: Secondary | ICD-10-CM | POA: Diagnosis not present

## 2018-04-30 DIAGNOSIS — I472 Ventricular tachycardia: Secondary | ICD-10-CM | POA: Diagnosis not present

## 2018-04-30 DIAGNOSIS — R2681 Unsteadiness on feet: Secondary | ICD-10-CM | POA: Diagnosis not present

## 2018-04-30 DIAGNOSIS — M6281 Muscle weakness (generalized): Secondary | ICD-10-CM | POA: Diagnosis not present

## 2018-04-30 DIAGNOSIS — R262 Difficulty in walking, not elsewhere classified: Secondary | ICD-10-CM | POA: Diagnosis not present

## 2018-05-01 DIAGNOSIS — R262 Difficulty in walking, not elsewhere classified: Secondary | ICD-10-CM | POA: Diagnosis not present

## 2018-05-01 DIAGNOSIS — M6281 Muscle weakness (generalized): Secondary | ICD-10-CM | POA: Diagnosis not present

## 2018-05-01 DIAGNOSIS — R278 Other lack of coordination: Secondary | ICD-10-CM | POA: Diagnosis not present

## 2018-05-01 DIAGNOSIS — I472 Ventricular tachycardia: Secondary | ICD-10-CM | POA: Diagnosis not present

## 2018-05-01 DIAGNOSIS — R2681 Unsteadiness on feet: Secondary | ICD-10-CM | POA: Diagnosis not present

## 2018-05-02 DIAGNOSIS — I472 Ventricular tachycardia: Secondary | ICD-10-CM | POA: Diagnosis not present

## 2018-05-02 DIAGNOSIS — R262 Difficulty in walking, not elsewhere classified: Secondary | ICD-10-CM | POA: Diagnosis not present

## 2018-05-02 DIAGNOSIS — R278 Other lack of coordination: Secondary | ICD-10-CM | POA: Diagnosis not present

## 2018-05-02 DIAGNOSIS — M6281 Muscle weakness (generalized): Secondary | ICD-10-CM | POA: Diagnosis not present

## 2018-05-02 DIAGNOSIS — R2681 Unsteadiness on feet: Secondary | ICD-10-CM | POA: Diagnosis not present

## 2018-05-03 ENCOUNTER — Encounter: Payer: Self-pay | Admitting: Cardiology

## 2018-05-03 ENCOUNTER — Ambulatory Visit: Payer: Medicare HMO | Admitting: Cardiology

## 2018-05-03 DIAGNOSIS — M11261 Other chondrocalcinosis, right knee: Secondary | ICD-10-CM | POA: Diagnosis not present

## 2018-05-03 DIAGNOSIS — M6281 Muscle weakness (generalized): Secondary | ICD-10-CM | POA: Diagnosis not present

## 2018-05-03 DIAGNOSIS — N183 Chronic kidney disease, stage 3 unspecified: Secondary | ICD-10-CM

## 2018-05-03 DIAGNOSIS — Z8679 Personal history of other diseases of the circulatory system: Secondary | ICD-10-CM

## 2018-05-03 DIAGNOSIS — I472 Ventricular tachycardia: Secondary | ICD-10-CM | POA: Diagnosis not present

## 2018-05-03 DIAGNOSIS — R262 Difficulty in walking, not elsewhere classified: Secondary | ICD-10-CM | POA: Diagnosis not present

## 2018-05-03 DIAGNOSIS — I5043 Acute on chronic combined systolic (congestive) and diastolic (congestive) heart failure: Secondary | ICD-10-CM

## 2018-05-03 DIAGNOSIS — R778 Other specified abnormalities of plasma proteins: Secondary | ICD-10-CM

## 2018-05-03 DIAGNOSIS — I340 Nonrheumatic mitral (valve) insufficiency: Secondary | ICD-10-CM | POA: Diagnosis not present

## 2018-05-03 DIAGNOSIS — R278 Other lack of coordination: Secondary | ICD-10-CM | POA: Diagnosis not present

## 2018-05-03 DIAGNOSIS — R7989 Other specified abnormal findings of blood chemistry: Secondary | ICD-10-CM | POA: Diagnosis not present

## 2018-05-03 DIAGNOSIS — R2681 Unsteadiness on feet: Secondary | ICD-10-CM | POA: Diagnosis not present

## 2018-05-03 MED ORDER — FUROSEMIDE 20 MG PO TABS: ORAL_TABLET | ORAL | 2 refills | Status: DC

## 2018-05-03 NOTE — Assessment & Plan Note (Signed)
CHF when admitted jan 2020 with PSVT-CXR then showed vascular congestion- BNP 1750

## 2018-05-03 NOTE — Assessment & Plan Note (Signed)
PSVT- Flecainide added Jan 2020 (declined RFA) 

## 2018-05-03 NOTE — Patient Instructions (Signed)
Medication Instructions:  START Lasix Take 1 tablet Mondays, Wednesdays and Fridays If you need a refill on your cardiac medications before your next appointment, please call your pharmacy.   Lab work: Your physician recommends that you return for lab work in: next week [place] BMET, BNP If you have labs (blood work) drawn today and your tests are completely normal, you will receive your results only by: Marland Kitchen MyChart Message (if you have MyChart) OR . A paper copy in the mail If you have any lab test that is abnormal or we need to change your treatment, we will call you to review the results.  Testing/Procedures: NONE  Follow-Up: At [facility], you and your health needs are our priority.  As part of our continuing mission to provide you with exceptional heart care, we have created designated Provider Care Teams.  These Care Teams include your primary Cardiologist (physician) and Advanced Practice Providers (APPs -  Physician Assistants and Nurse Practitioners) who all work together to provide you with the care you need, when you need it. . Your physician recommends that you schedule a follow-up appointment in: LAST WEEK IN February WITH DR BERRY ONLY.  Any Other Special Instructions Will Be Listed Below (If Applicable).

## 2018-05-03 NOTE — Progress Notes (Signed)
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 He just stopped driving 3 years ago because of macular degeneration degeneration.  He lives in [place] with his wife.  His cardiologist was Dr. Marc Morgans but hehas sincedtransitionedto Dr Fernanda Drum of proximity.  He has a history of moderate mitral regurgitation with normal LVF by echo March 2018.  He wishes no invasive procedures for his mitral valve.  [facility] which brokewith Valsalva maneuver and was placed onlow dose metoprolol.   He has hadoccasionalepisodes of PSVT since which have similarly broke with vagal maneuvers.  He did end up in the ED in Nov 2019 with palpitations but was in NSR-64 by the time he was evaluated.  He was treated for a suspected UTI then (though f/u urine culture was negative).  He is fairly active.  He is on Lasix 20 mg three times a week for chronic combined CHF and his weight has been stable, 166-171 lbs.   He was seen in the office 04/09/18 with SVT. It broke with carotid massage and his Lopressor was increased to 25 mg TID.  He had recurrent PSVT and was admitted through the ED 04/16/18.  He broke with vagal maneuver in the ED.  He had vascular congestion on CXR and Lasix was added.  He was seen by Dr Rayann Heman and options were discussed.  The patient and family again expressed they're wish to avoid invasive Rx and opted for Flecainide.  Echo showed moderate MR, moderate pulmonary HTN (PA 50 mmHg) and normal LVF.  He had a bump in his SCr to 2.5 with diuresis and his lasix was held at discharge.  OP follow up BMP by his PCP on 04/23/2018 showed a SCr of 1.4.  During that hospitalization he also had his Rt  knee aspirated secondary to an effusion s/p recent fall as an OP. He was discharged 04/20/2018.   Since discharge he has done pretty well.  He denies any recurrent PSVT.  He does have issues with arthritis and had some Rt elbow pain and swelling which improved with NSAIDs and ice.  His weight is up to 171 lbs his discharge weight of 165 lbs.   Current Outpatient Medications  Medication Sig Dispense Refill  . acetaminophen (TYLENOL) 500 MG tablet Take 1,000 mg by mouth daily as needed for mild pain.    Marland Kitchen aspirin EC 81 MG EC tablet Take 1 tablet (81 mg total) by mouth daily. 30 tablet 0  . Cholecalciferol (VITAMIN D) 2000 units CAPS Take 2,000 Units by mouth daily.    . Cyanocobalamin (VITAMIN B 12 PO) Take 5,000 mg by mouth daily.     . finasteride (PROSCAR) 5 MG tablet Take 5 mg by mouth daily.    . flecainide (TAMBOCOR) 50 MG tablet Take 1 tablet (50 mg total) by mouth every 12 (twelve) hours. 60 tablet 0  . fluticasone (FLONASE) 50 MCG/ACT nasal spray Place 1-2 sprays into both nostrils daily as needed for allergies or rhinitis.    . Glucosamine HCl 1000 MG TABS Take 1 tablet by mouth daily.    . hydroxypropyl methylcellulose / hypromellose (ISOPTO TEARS / GONIOVISC) 2.5 % ophthalmic solution Place 1 drop into both eyes 2 (two) times daily.    Marland Kitchen  Lutein 6 MG CAPS Take 1 capsule by mouth daily.     . meloxicam (MOBIC) 15 MG tablet Take 15 mg by mouth daily as needed for pain.    . metoprolol tartrate (LOPRESSOR) 25 MG tablet Take 1 tablet (25 mg total) by mouth 2 (two) times daily. 60 tablet 0  . Misc Natural Products (TURMERIC CURCUMIN) CAPS Take 1 capsule by mouth daily.    . Multiple Minerals-Vitamins (CALCIUM-MAGNESIUM-ZINC-D3 PO) Take 1 tablet by mouth daily.    . Multiple Vitamin (MULTIVITAMIN) tablet Take 1 tablet by mouth daily.    . Multiple Vitamins-Minerals (OCUVITE EYE HEALTH FORMULA) CAPS Take 1 tablet by mouth daily.    . naproxen (NAPROSYN) 375 MG tablet Take 375 mg by mouth  daily as needed for mild pain.    Marland Kitchen OVER THE COUNTER MEDICATION 1 Container by Mouth Rinse route 2 (two) times daily. [employer] Peroxyl    . RA KRILL OIL 500 MG CAPS Take 500 mg by mouth daily.    . traMADol (ULTRAM) 50 MG tablet Take 1 tablet (50 mg total) by mouth every 6 (six) hours as needed for severe pain. 30 tablet 0  . vitamin C (ASCORBIC ACID) 500 MG tablet Take 500 mg by mouth daily.    . furosemide (LASIX) 20 MG tablet TAKE 1 TABLET ON MONDAY WEDNESDAY FRIDAY ONLY 30 tablet 2   No current facility-administered medications for this visit.     Allergies  Allergen Reactions  . Barbital Rash and Other (See Comments)    Blue lips and swollen tongue    Past Medical History:  Diagnosis Date  . Age-related macular degeneration, dry, left eye   . Age-related macular degeneration, wet, right eye ([place])   . Arthritis    "right knee" (04/17/2018)  . CHF (congestive heart failure) ([place])   . GERD (gastroesophageal reflux disease)   . Heart murmur   . History of duodenal ulcer   . History of hiatal hernia   . LAFB (left anterior fascicular block)   . Mitral valve disorder    Moderate MR  . Nephrolithiasis 2013  . Pneumonia 1938; ?date   "once as a child; once as an adult" (04/17/2018)  . PSVT (paroxysmal supraventricular tachycardia) ([place]) 06/2016    Social History   Socioeconomic History  . Marital status: Married    Spouse name: Not on file  . Number of children: Not on file  . Years of education: Not on file  . Highest education level: Not on file  Occupational History  . Not on file  Social Needs  . Financial resource strain: Not on file  . Food insecurity:    Worry: Not on file    Inability: Not on file  . Transportation needs:    Medical: Not on file    Non-medical: Not on file  Tobacco Use  . Smoking status: Former Research scientist (life sciences)  . Smokeless tobacco: Never Used  . Tobacco comment: 04/17/2018 "quit smoking when I was 14"  Substance and Sexual Activity  . Alcohol use: Yes      Alcohol/week: 3.0 standard drinks    Types: 3 Glasses of wine per week  . Drug use: Never  . Sexual activity: Not on file  Lifestyle  . Physical activity:    Days per week: Not on file    Minutes per session: Not on file  . Stress: Not on file  Relationships  . Social connections:    Talks on phone: Not on file  Gets together: Not on file    Attends religious service: Not on file    Active member of club or organization: Not on file    Attends meetings of clubs or organizations: Not on file    Relationship status: Not on file  . Intimate partner violence:    Fear of current or ex partner: Not on file    Emotionally abused: Not on file    Physically abused: Not on file    Forced sexual activity: Not on file  Other Topics Concern  . Not on file  Social History Narrative  . Not on file     Family History  Problem Relation Age of Onset  . Cancer Mother   . Pneumonia Father      Review of Systems: General: negative for chills, fever, night sweats or weight changes.  Cardiovascular: negative for chest pain, dyspnea on exertion, edema, orthopnea, palpitations, paroxysmal nocturnal dyspnea or shortness of breath Dermatological: negative for rash Respiratory: negative for cough or wheezing Urologic: negative for hematuria-pt to see Dr Lawerance Bach for BPH symptoms Abdominal: negative for nausea, vomiting, diarrhea, bright red blood per rectum, melena, or hematemesis Neurologic: negative for visual changes, syncope, or dizziness All other systems reviewed and are otherwise negative except as noted above.    Blood pressure 132/82, pulse 80, height 5\' 6"  (1.676 m), weight 171 lb 9.6 oz (77.8 kg).  General appearance: alert, cooperative, appears stated age, no distress and kyphotic Neck: no JVD Lungs: decreased Lt base, no rales Heart: regular rate and rhythm and 2-3/6 MR murmur Extremities: 1+ pitting edema Skin: pale, warm, dry Neurologic: Grossly normal  EKG NSR-80,  1st degree AVB, IVCD, QTc 489  ASSESSMENT AND PLAN:   History of PSVT  PSVT- Flecainide added Jan 2020 (declined RFA)-holding NSR  Acute on chronic combined systolic and diastolic CHF (congestive heart failure) ([place])  CHF when admitted Jan 2020 with PSVT-CXR then showed vascular congestion- BNP 1750. Weight up and slight volume overload on exam today  Chronic renal insufficiency, stage 3 (moderate) (HCC) Transient AKI with diuresis Jan 2020- Lasix held. SCr down to 1.4 on Jan 13th  Moderate mitral regurgitation Moderate mitral regurgitation with normal LVF by echo Jan 2020 Also mild AS and moderate pulmonary HTN (PA 50 mmHg)   PLAN  Resume Lasix 20 mg MWF. Check BMP and BNP in one week.  F/U with Dr Gwenlyn Found later in Feb, keep f/u with Dr Rayann Heman as scheduled.  I have asked the patient to limit use of NSAIDs as much as possible, using ice and Tylenol instead.   Kerin Ransom PA-C 05/03/2018 10:01 AM

## 2018-05-03 NOTE — Assessment & Plan Note (Signed)
Moderate mitral regurgitation with normal LVF by echo Jan 2020 Also mild AS and moderate pulmonary HTN (PA 50 mmHg) 

## 2018-05-03 NOTE — Assessment & Plan Note (Signed)
Transient AKI with diuresis jan 2020- SCr down to 1.4 on Jan 13th

## 2018-05-04 DIAGNOSIS — R262 Difficulty in walking, not elsewhere classified: Secondary | ICD-10-CM | POA: Diagnosis not present

## 2018-05-04 DIAGNOSIS — M6281 Muscle weakness (generalized): Secondary | ICD-10-CM | POA: Diagnosis not present

## 2018-05-04 DIAGNOSIS — I472 Ventricular tachycardia: Secondary | ICD-10-CM | POA: Diagnosis not present

## 2018-05-04 DIAGNOSIS — R278 Other lack of coordination: Secondary | ICD-10-CM | POA: Diagnosis not present

## 2018-05-04 DIAGNOSIS — R2681 Unsteadiness on feet: Secondary | ICD-10-CM | POA: Diagnosis not present

## 2018-05-07 DIAGNOSIS — R278 Other lack of coordination: Secondary | ICD-10-CM | POA: Diagnosis not present

## 2018-05-07 DIAGNOSIS — M6281 Muscle weakness (generalized): Secondary | ICD-10-CM | POA: Diagnosis not present

## 2018-05-07 DIAGNOSIS — R262 Difficulty in walking, not elsewhere classified: Secondary | ICD-10-CM | POA: Diagnosis not present

## 2018-05-07 DIAGNOSIS — R2681 Unsteadiness on feet: Secondary | ICD-10-CM | POA: Diagnosis not present

## 2018-05-07 DIAGNOSIS — I472 Ventricular tachycardia: Secondary | ICD-10-CM | POA: Diagnosis not present

## 2018-05-07 NOTE — Addendum Note (Signed)
Addended by: Vennie Homans on: 05/07/2018 04:34 PM   Modules accepted: Orders

## 2018-05-08 ENCOUNTER — Ambulatory Visit: Payer: Medicare HMO | Admitting: Internal Medicine

## 2018-05-08 DIAGNOSIS — R2681 Unsteadiness on feet: Secondary | ICD-10-CM | POA: Diagnosis not present

## 2018-05-08 DIAGNOSIS — R278 Other lack of coordination: Secondary | ICD-10-CM | POA: Diagnosis not present

## 2018-05-08 DIAGNOSIS — R262 Difficulty in walking, not elsewhere classified: Secondary | ICD-10-CM | POA: Diagnosis not present

## 2018-05-08 DIAGNOSIS — M6281 Muscle weakness (generalized): Secondary | ICD-10-CM | POA: Diagnosis not present

## 2018-05-08 DIAGNOSIS — I472 Ventricular tachycardia: Secondary | ICD-10-CM | POA: Diagnosis not present

## 2018-05-09 DIAGNOSIS — Z8679 Personal history of other diseases of the circulatory system: Secondary | ICD-10-CM | POA: Diagnosis not present

## 2018-05-09 DIAGNOSIS — M6281 Muscle weakness (generalized): Secondary | ICD-10-CM | POA: Diagnosis not present

## 2018-05-09 DIAGNOSIS — R2681 Unsteadiness on feet: Secondary | ICD-10-CM | POA: Diagnosis not present

## 2018-05-09 DIAGNOSIS — R262 Difficulty in walking, not elsewhere classified: Secondary | ICD-10-CM | POA: Diagnosis not present

## 2018-05-09 DIAGNOSIS — M11261 Other chondrocalcinosis, right knee: Secondary | ICD-10-CM | POA: Diagnosis not present

## 2018-05-09 DIAGNOSIS — I340 Nonrheumatic mitral (valve) insufficiency: Secondary | ICD-10-CM | POA: Diagnosis not present

## 2018-05-09 DIAGNOSIS — R7989 Other specified abnormal findings of blood chemistry: Secondary | ICD-10-CM | POA: Diagnosis not present

## 2018-05-09 DIAGNOSIS — R278 Other lack of coordination: Secondary | ICD-10-CM | POA: Diagnosis not present

## 2018-05-09 DIAGNOSIS — I5043 Acute on chronic combined systolic (congestive) and diastolic (congestive) heart failure: Secondary | ICD-10-CM | POA: Diagnosis not present

## 2018-05-09 DIAGNOSIS — I472 Ventricular tachycardia: Secondary | ICD-10-CM | POA: Diagnosis not present

## 2018-05-10 LAB — BASIC METABOLIC PANEL
BUN/Creatinine Ratio: 23 (ref 10–24)
BUN: 29 mg/dL (ref 10–36)
CO2: 22 mmol/L (ref 20–29)
Calcium: 9.1 mg/dL (ref 8.6–10.2)
Chloride: 102 mmol/L (ref 96–106)
Creatinine, Ser: 1.26 mg/dL (ref 0.76–1.27)
GFR calc Af Amer: 56 mL/min/{1.73_m2} — ABNORMAL LOW (ref >59)
GFR calc non Af Amer: 49 mL/min/{1.73_m2} — ABNORMAL LOW (ref >59)
Glucose: 121 mg/dL — ABNORMAL HIGH (ref 65–99)
Potassium: 4.6 mmol/L (ref 3.5–5.2)
Sodium: 142 mmol/L (ref 134–144)

## 2018-05-10 LAB — PRO B NATRIURETIC PEPTIDE: NT-Pro BNP: 7252 pg/mL — ABNORMAL HIGH (ref 0–486)

## 2018-05-11 ENCOUNTER — Ambulatory Visit: Payer: Medicare HMO | Admitting: Cardiology

## 2018-05-11 DIAGNOSIS — R262 Difficulty in walking, not elsewhere classified: Secondary | ICD-10-CM | POA: Diagnosis not present

## 2018-05-11 DIAGNOSIS — I472 Ventricular tachycardia: Secondary | ICD-10-CM | POA: Diagnosis not present

## 2018-05-11 DIAGNOSIS — R2681 Unsteadiness on feet: Secondary | ICD-10-CM | POA: Diagnosis not present

## 2018-05-11 DIAGNOSIS — M6281 Muscle weakness (generalized): Secondary | ICD-10-CM | POA: Diagnosis not present

## 2018-05-11 DIAGNOSIS — R278 Other lack of coordination: Secondary | ICD-10-CM | POA: Diagnosis not present

## 2018-05-16 ENCOUNTER — Other Ambulatory Visit: Payer: Self-pay

## 2018-05-16 DIAGNOSIS — R278 Other lack of coordination: Secondary | ICD-10-CM | POA: Diagnosis not present

## 2018-05-16 DIAGNOSIS — R2681 Unsteadiness on feet: Secondary | ICD-10-CM | POA: Diagnosis not present

## 2018-05-16 DIAGNOSIS — R262 Difficulty in walking, not elsewhere classified: Secondary | ICD-10-CM | POA: Diagnosis not present

## 2018-05-16 DIAGNOSIS — M6281 Muscle weakness (generalized): Secondary | ICD-10-CM | POA: Diagnosis not present

## 2018-05-16 MED ORDER — FLECAINIDE ACETATE 50 MG PO TABS: 50.0000 mg | ORAL_TABLET | Freq: Two times a day (BID) | ORAL | 1 refills | Status: DC

## 2018-05-17 DIAGNOSIS — M6281 Muscle weakness (generalized): Secondary | ICD-10-CM | POA: Diagnosis not present

## 2018-05-17 DIAGNOSIS — R262 Difficulty in walking, not elsewhere classified: Secondary | ICD-10-CM | POA: Diagnosis not present

## 2018-05-17 DIAGNOSIS — R2681 Unsteadiness on feet: Secondary | ICD-10-CM | POA: Diagnosis not present

## 2018-05-17 DIAGNOSIS — R278 Other lack of coordination: Secondary | ICD-10-CM | POA: Diagnosis not present

## 2018-05-18 DIAGNOSIS — R278 Other lack of coordination: Secondary | ICD-10-CM | POA: Diagnosis not present

## 2018-05-18 DIAGNOSIS — M6281 Muscle weakness (generalized): Secondary | ICD-10-CM | POA: Diagnosis not present

## 2018-05-18 DIAGNOSIS — R2681 Unsteadiness on feet: Secondary | ICD-10-CM | POA: Diagnosis not present

## 2018-05-18 DIAGNOSIS — R262 Difficulty in walking, not elsewhere classified: Secondary | ICD-10-CM | POA: Diagnosis not present

## 2018-05-21 ENCOUNTER — Ambulatory Visit: Payer: Medicare HMO | Admitting: Internal Medicine

## 2018-05-21 ENCOUNTER — Encounter: Payer: Self-pay | Admitting: Internal Medicine

## 2018-05-21 VITALS — BP 132/80 | HR 79 | Ht 66.5 in | Wt 163.4 lb

## 2018-05-21 DIAGNOSIS — I471 Supraventricular tachycardia: Secondary | ICD-10-CM | POA: Diagnosis not present

## 2018-05-21 DIAGNOSIS — I340 Nonrheumatic mitral (valve) insufficiency: Secondary | ICD-10-CM

## 2018-05-21 DIAGNOSIS — I5032 Chronic diastolic (congestive) heart failure: Secondary | ICD-10-CM | POA: Diagnosis not present

## 2018-05-21 NOTE — Patient Instructions (Addendum)
Medication Instructions:  Your physician recommends that you continue on your current medications as directed. Please refer to the Current Medication list given to you today.  Labwork: None ordered.  Testing/Procedures: None ordered.  Follow-Up: Your physician wants you to follow-up in: 2 months with Dr. Sherene Sires visit   Any Other Special Instructions Will Be Listed Below (If Applicable).  If you need a refill on your cardiac medications before your next appointment, please call your pharmacy.

## 2018-05-21 NOTE — Progress Notes (Signed)
PCP: [name], MD Primary Cardiologist: Dr Gwenlyn Found Primary EP: Dr Dewayne Hatch [name] is a 85 y.o. male who presents today for routine electrophysiology followup.  Since I saw him in the hospital, the patient reports doing very well. No further SVT.  Tolerating flecainide. Today, he denies symptoms of palpitations, chest pain, shortness of breath,  lower extremity edema, dizziness, presyncope, or syncope.  The patient is otherwise without complaint today.   Past Medical History:  Diagnosis Date  . Age-related macular degeneration, dry, left eye   . Age-related macular degeneration, wet, right eye ([place])   . Arthritis    "right knee" (04/17/2018)  . CHF (congestive heart failure) ([place])   . GERD (gastroesophageal reflux disease)   . Heart murmur   . History of duodenal ulcer   . History of hiatal hernia   . LAFB (left anterior fascicular block)   . Mitral valve disorder    Moderate MR  . Nephrolithiasis 2013  . Pneumonia 1938; ?date   "once as a child; once as an adult" (04/17/2018)  . PSVT (paroxysmal supraventricular tachycardia) ([place]) 06/2016   Past Surgical History:  Procedure Laterality Date  . CARDIAC CATHETERIZATION    . CATARACT EXTRACTION W/ INTRAOCULAR LENS  IMPLANT, BILATERAL Bilateral 2013  . SKIN BIOPSY Left    "thigh; suspected CA; it was not"    ROS- all systems are reviewed and negatives except as per HPI above  Current Outpatient Medications  Medication Sig Dispense Refill  . acetaminophen (TYLENOL) 500 MG tablet Take 1,000 mg by mouth daily as needed for mild pain.    . Cholecalciferol (VITAMIN D) 2000 units CAPS Take 2,000 Units by mouth daily.    . Cyanocobalamin (VITAMIN B 12 PO) Take 5,000 mg by mouth daily.     . finasteride (PROSCAR) 5 MG tablet Take 5 mg by mouth daily.    . flecainide (TAMBOCOR) 50 MG tablet Take 1 tablet (50 mg total) by mouth every 12 (twelve) hours for 30 days. 60 tablet 1  . fluticasone (FLONASE) 50 MCG/ACT nasal spray  Place 1-2 sprays into both nostrils daily as needed for allergies or rhinitis.    . furosemide (LASIX) 20 MG tablet TAKE 1 TABLET ON MONDAY WEDNESDAY FRIDAY ONLY 30 tablet 2  . Glucosamine HCl 1000 MG TABS Take 1 tablet by mouth daily.    . hydroxypropyl methylcellulose / hypromellose (ISOPTO TEARS / GONIOVISC) 2.5 % ophthalmic solution Place 1 drop into both eyes 2 (two) times daily.    . Lutein 6 MG CAPS Take 1 capsule by mouth daily.     . meloxicam (MOBIC) 15 MG tablet Take 15 mg by mouth daily as needed for pain.    . Misc Natural Products (TURMERIC CURCUMIN) CAPS Take 1 capsule by mouth daily.    . Multiple Minerals-Vitamins (CALCIUM-MAGNESIUM-ZINC-D3 PO) Take 1 tablet by mouth daily.    . Multiple Vitamin (MULTIVITAMIN) tablet Take 1 tablet by mouth daily.    . Multiple Vitamins-Minerals (OCUVITE EYE HEALTH FORMULA) CAPS Take 1 tablet by mouth daily.    . naproxen (NAPROSYN) 375 MG tablet Take 375 mg by mouth daily as needed for mild pain.    Marland Kitchen OVER THE COUNTER MEDICATION 1 Container by Mouth Rinse route 2 (two) times daily. [employer] Peroxyl    . RA KRILL OIL 500 MG CAPS Take 500 mg by mouth daily.    . vitamin C (ASCORBIC ACID) 500 MG tablet Take 500 mg by mouth daily.    Marland Kitchen  metoprolol tartrate (LOPRESSOR) 25 MG tablet Take 1 tablet (25 mg total) by mouth 2 (two) times daily. 60 tablet 0   No current facility-administered medications for this visit.     Physical Exam: Vitals:   05/21/18 1137  BP: 132/80  Pulse: 79  SpO2: 98%  Weight: 163 lb 6.4 oz (74.1 kg)  Height: 5' 6.5" (1.689 m)    GEN- The patient is elderly appearing, alert and oriented x 3 today.   Head- normocephalic, atraumatic Eyes-  Sclera clear, conjunctiva pink Ears- hearing intact Oropharynx- clear Lungs- Clear to ausculation bilaterally, normal work of breathing Heart- Regular rate and rhythm, 2/6 SEM LUSB, 2/6 holosystlic murmur at the apex GI- soft, NT, ND, + BS Extremities- no clubbing, cyanosis, or  edema  Wt Readings from Last 3 Encounters:  05/21/18 163 lb 6.4 oz (74.1 kg)  05/03/18 171 lb 9.6 oz (77.8 kg)  04/20/18 165 lb 5.5 oz (75 kg)    EKG tracing ordered today is personally reviewed and shows sinus with first degree AV block, LBBB  Assessment and Plan:  1.  SVT Currently controlled with flecainide Patient and family are clear in their wishes to avoid ablation. No changes today  2. First degree AV block/ LBBB Will follow closely Consider reducing flecainide or metoprolol in the future.  I would not advise increasing flecainide if we can help it.  3. Chronic diastolic dysfunction Stable No change required today  4. Moderate MR, mild AS Stable on exam  Return to see me in 2 months  Travis Grayer MD, [facility] 05/21/2018 11:59 AM

## 2018-05-29 ENCOUNTER — Telehealth: Payer: Self-pay | Admitting: *Deleted

## 2018-05-29 DIAGNOSIS — I5043 Acute on chronic combined systolic (congestive) and diastolic (congestive) heart failure: Secondary | ICD-10-CM | POA: Diagnosis not present

## 2018-05-29 DIAGNOSIS — Z5181 Encounter for therapeutic drug level monitoring: Secondary | ICD-10-CM | POA: Diagnosis not present

## 2018-05-29 NOTE — Telephone Encounter (Signed)
Patient came into office for labs, BNP/BMET orders placed.

## 2018-05-30 LAB — BASIC METABOLIC PANEL
BUN/Creatinine Ratio: 22 (ref 10–24)
BUN: 27 mg/dL (ref 10–36)
CO2: 27 mmol/L (ref 20–29)
Calcium: 8.9 mg/dL (ref 8.6–10.2)
Chloride: 100 mmol/L (ref 96–106)
Creatinine, Ser: 1.24 mg/dL (ref 0.76–1.27)
GFR calc Af Amer: 58 mL/min/{1.73_m2} — ABNORMAL LOW (ref >59)
GFR calc non Af Amer: 50 mL/min/{1.73_m2} — ABNORMAL LOW (ref >59)
Glucose: 66 mg/dL (ref 65–99)
Potassium: 4.2 mmol/L (ref 3.5–5.2)
Sodium: 137 mmol/L (ref 134–144)

## 2018-05-30 LAB — PRO B NATRIURETIC PEPTIDE: NT-Pro BNP: 4532 pg/mL — ABNORMAL HIGH (ref 0–486)

## 2018-06-05 ENCOUNTER — Ambulatory Visit: Payer: Medicare HMO | Admitting: Cardiovascular Disease

## 2018-06-06 ENCOUNTER — Ambulatory Visit: Payer: Medicare HMO | Admitting: Cardiovascular Disease

## 2018-06-06 ENCOUNTER — Encounter: Payer: Self-pay | Admitting: Cardiovascular Disease

## 2018-06-06 DIAGNOSIS — Z8679 Personal history of other diseases of the circulatory system: Secondary | ICD-10-CM | POA: Diagnosis not present

## 2018-06-06 NOTE — Patient Instructions (Signed)
Medication Instructions:  Your physician recommends that you continue on your current medications as directed. Please refer to the Current Medication list given to you today.  If you need a refill on your cardiac medications before your next appointment, please call your pharmacy.   Lab work: NONE If you have labs (blood work) drawn today and your tests are completely normal, you will receive your results only by: Marland Kitchen MyChart Message (if you have MyChart) OR . A paper copy in the mail If you have any lab test that is abnormal or we need to change your treatment, we will call you to review the results.  Testing/Procedures: NONE  Follow-Up: At [facility], you and your health needs are our priority.  As part of our continuing mission to provide you with exceptional heart care, we have created designated Provider Care Teams.  These Care Teams include your primary Cardiologist (physician) and Advanced Practice Providers (APPs -  Physician Assistants and Nurse Practitioners) who all work together to provide you with the care you need, when you need it. . You will need a follow up appointment in 3 months with Kerin Ransom, PA-C and in 6 months with Dr. Gwenlyn Found.  Please call our office 2 months in advance to schedule this appointment.  You may see Dr. Gwenlyn Found or one of the following Advanced Practice Providers on your designated Care Team:   . Kerin Ransom, [place] . Almyra Deforest, PA-C . Fabian Sharp, PA-C . Jory Sims, DNP . Rosaria Ferries, PA-C . Roby Lofts, PA-C . Sande Rives, PA-C

## 2018-06-06 NOTE — Progress Notes (Signed)
06/06/2018 Travis Kelly   1922-12-13  [id number]  Primary Physician [name], MD Primary Cardiologist: Lorretta Harp MD [name], [place]  HPI:  Travis Kelly is a 85 y.o.  married, father of 68, grandfather of 68 grandchildren was accompanied by his daughterNora.  I last saw him in the office 02/06/2018.  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 He just stopped driving 3 years ago because of macular degeneration degeneration. He lives in [place] retirement [place] with his wife. His cardiologist was Dr. Marc Morgans but he is transitioning to me because of proximity. He has a history of moderate mitral regurgitation, hyperlipidemia and left anterior fascicular block. He wishes no invasive procedures for his mitral valve. [facility] which broke. With Valsalva maneuver. He was placed on when necessary metoprolol. He has had several episodes of PSVT since which have similarly broke with vagal maneuvers. He is fairly active.  Since I saw him a 3 months ago he was admitted [facility]/16/2018 with PSVT.  He was seen in consultation the following day by Dr. Rayann Heman who put him on flecainide 50 mg p.o. twice daily which has resulted in marked improvement in his PSVT episodes.  He has had no adverse episodes since discharge 6 weeks ago.   Current Meds  Medication Sig  . acetaminophen (TYLENOL) 500 MG tablet Take 1,000 mg by mouth daily as needed for mild pain.  . Cholecalciferol (VITAMIN D) 2000 units CAPS Take 2,000 Units by mouth daily.  . Cyanocobalamin (VITAMIN B 12 PO) Take 5,000 mg by mouth daily.   . finasteride (PROSCAR) 5 MG tablet Take 5 mg by mouth daily.  . flecainide (TAMBOCOR) 50 MG tablet Take 1 tablet (50 mg total) by mouth every 12 (twelve) hours for 30 days.  . fluticasone (FLONASE) 50 MCG/ACT nasal spray Place 1-2 sprays into both nostrils daily as  needed for allergies or rhinitis.  . furosemide (LASIX) 20 MG tablet Take 20 mg by mouth daily.  . Glucosamine HCl 1000 MG TABS Take 1 tablet by mouth daily.  . hydroxypropyl methylcellulose / hypromellose (ISOPTO TEARS / GONIOVISC) 2.5 % ophthalmic solution Place 1 drop into both eyes 2 (two) times daily.  . Lutein 6 MG CAPS Take 1 capsule by mouth daily.   . meloxicam (MOBIC) 15 MG tablet Take 15 mg by mouth daily as needed for pain.  . metoprolol tartrate (LOPRESSOR) 12.5 mg TABS tablet Take 12.5 mg by mouth 2 (two) times daily.  . Misc Natural Products (TURMERIC CURCUMIN) CAPS Take 1 capsule by mouth daily.  . Multiple Minerals-Vitamins (CALCIUM-MAGNESIUM-ZINC-D3 PO) Take 1 tablet by mouth daily.  . Multiple Vitamin (MULTIVITAMIN) tablet Take 1 tablet by mouth daily.  . Multiple Vitamins-Minerals (OCUVITE EYE HEALTH FORMULA) CAPS Take 1 tablet by mouth daily.  . naproxen (NAPROSYN) 375 MG tablet Take 375 mg by mouth daily as needed for mild pain.  Marland Kitchen OVER THE COUNTER MEDICATION 1 Container by Mouth Rinse route 2 (two) times daily. [employer] Peroxyl  . RA KRILL OIL 500 MG CAPS Take 500 mg by mouth daily.  . vitamin C (ASCORBIC ACID) 500 MG tablet Take 500 mg by mouth daily.     Allergies  Allergen Reactions  . Rocephin [Ceftriaxone Sodium In Dextrose]     Vomiting, hallucinations  . Barbital Rash and Other (See Comments)    Blue lips and swollen tongue  Social History   Socioeconomic History  . Marital status: Married    Spouse name: Not on file  . Number of children: Not on file  . Years of education: Not on file  . Highest education level: Not on file  Occupational History  . Not on file  Social Needs  . Financial resource strain: Not on file  . Food insecurity:    Worry: Not on file    Inability: Not on file  . Transportation needs:    Medical: Not on file    Non-medical: Not on file  Tobacco Use  . Smoking status: Former Research scientist (life sciences)  . Smokeless tobacco: Never Used  .  Tobacco comment: 04/17/2018 "quit smoking when I was 14"  Substance and Sexual Activity  . Alcohol use: Yes    Alcohol/week: 3.0 standard drinks    Types: 3 Glasses of wine per week  . Drug use: Never  . Sexual activity: Not on file  Lifestyle  . Physical activity:    Days per week: Not on file    Minutes per session: Not on file  . Stress: Not on file  Relationships  . Social connections:    Talks on phone: Not on file    Gets together: Not on file    Attends religious service: Not on file    Active member of club or organization: Not on file    Attends meetings of clubs or organizations: Not on file    Relationship status: Not on file  . Intimate partner violence:    Fear of current or ex partner: Not on file    Emotionally abused: Not on file    Physically abused: Not on file    Forced sexual activity: Not on file  Other Topics Concern  . Not on file  Social History Narrative  . Not on file     Review of Systems: General: negative for chills, fever, night sweats or weight changes.  Cardiovascular: negative for chest pain, dyspnea on exertion, edema, orthopnea, palpitations, paroxysmal nocturnal dyspnea or shortness of breath Dermatological: negative for rash Respiratory: negative for cough or wheezing Urologic: negative for hematuria Abdominal: negative for nausea, vomiting, diarrhea, bright red blood per rectum, melena, or hematemesis Neurologic: negative for visual changes, syncope, or dizziness All other systems reviewed and are otherwise negative except as noted above.    Blood pressure 121/73, pulse 73, height 5\' 6"  (1.676 m), weight 169 lb 3.2 oz (76.7 kg), SpO2 95 %.  General appearance: alert and no distress Neck: no adenopathy, no carotid bruit, no JVD, supple, symmetrical, trachea midline and thyroid not enlarged, symmetric, no tenderness/mass/nodules Lungs: clear to auscultation bilaterally Heart: 2/6 systolic ejection murmur at the base Extremities:  extremities normal, atraumatic, no cyanosis or edema Pulses: 2+ and symmetric Skin: Skin color, texture, turgor normal. No rashes or lesions Neurologic: Alert and oriented X 3, normal strength and tone. Normal symmetric reflexes. Normal coordination and gait  EKG not performed today  ASSESSMENT AND PLAN:   History of PSVT  History of PSVT which was accelerating and progressing in severity now on flecainide 50 mg p.o. twice daily with marked improvement in his symptoms.      Lorretta Harp MD FACP,FACC,FAHA, [facility] 06/06/2018 12:25 PM

## 2018-06-06 NOTE — Assessment & Plan Note (Signed)
History of PSVT which was accelerating and progressing in severity now on flecainide 50 mg p.o. twice daily with marked improvement in his symptoms.

## 2018-06-07 ENCOUNTER — Other Ambulatory Visit: Payer: Self-pay | Admitting: Cardiovascular Disease

## 2018-06-10 ENCOUNTER — Other Ambulatory Visit: Payer: Self-pay | Admitting: Cardiology

## 2018-06-11 DIAGNOSIS — R3121 Asymptomatic microscopic hematuria: Secondary | ICD-10-CM | POA: Diagnosis not present

## 2018-06-11 DIAGNOSIS — N401 Enlarged prostate with lower urinary tract symptoms: Secondary | ICD-10-CM | POA: Diagnosis not present

## 2018-06-11 DIAGNOSIS — N138 Other obstructive and reflux uropathy: Secondary | ICD-10-CM | POA: Diagnosis not present

## 2018-06-11 DIAGNOSIS — N200 Calculus of kidney: Secondary | ICD-10-CM | POA: Diagnosis not present

## 2018-06-11 DIAGNOSIS — N402 Nodular prostate without lower urinary tract symptoms: Secondary | ICD-10-CM | POA: Diagnosis not present

## 2018-07-09 ENCOUNTER — Other Ambulatory Visit: Payer: Self-pay | Admitting: Cardiology

## 2018-07-10 ENCOUNTER — Telehealth: Payer: Self-pay | Admitting: Internal Medicine

## 2018-07-10 NOTE — Telephone Encounter (Signed)
New Message  Patients daughter [name] called in reference to patients upcoming appt in April. He is in a facility and con not leave they are okay with setting up a virtual visit. Please call to discuss.

## 2018-07-10 NOTE — Telephone Encounter (Signed)
Returned call to daughter.  Conferenced father into call.  Advised would be doing virtual visit for his upcoming appt.  MyChart invitation sent to Pt.  Pt email [email or web address].  Advised Pt would receive a call with further instructions when it gets closer to his appt.  Pt indicates understanding.

## 2018-07-28 ENCOUNTER — Telehealth: Payer: Self-pay

## 2018-07-28 NOTE — Telephone Encounter (Signed)
Called pt regarding appt on 07/30/18. I was not able to contact pt or leave VM.

## 2018-07-30 ENCOUNTER — Encounter: Payer: Self-pay | Admitting: Internal Medicine

## 2018-07-30 ENCOUNTER — Telehealth: Payer: Self-pay | Admitting: Internal Medicine

## 2018-07-30 ENCOUNTER — Other Ambulatory Visit: Payer: Self-pay

## 2018-07-30 ENCOUNTER — Telehealth (INDEPENDENT_AMBULATORY_CARE_PROVIDER_SITE_OTHER): Payer: Medicare HMO | Admitting: Internal Medicine

## 2018-07-30 VITALS — BP 128/72 | HR 80 | Wt 156.0 lb

## 2018-07-30 DIAGNOSIS — I5032 Chronic diastolic (congestive) heart failure: Secondary | ICD-10-CM | POA: Diagnosis not present

## 2018-07-30 DIAGNOSIS — I440 Atrioventricular block, first degree: Secondary | ICD-10-CM

## 2018-07-30 DIAGNOSIS — I447 Left bundle-branch block, unspecified: Secondary | ICD-10-CM | POA: Diagnosis not present

## 2018-07-30 DIAGNOSIS — I471 Supraventricular tachycardia: Secondary | ICD-10-CM | POA: Diagnosis not present

## 2018-07-30 NOTE — Telephone Encounter (Signed)
Visit is completed.

## 2018-07-30 NOTE — Telephone Encounter (Signed)
  Daughter is calling because Travis Kelly was supposed to have had a visit at 11:45 bur they have not heard anything from anyone. They would like to know what is going on. Is the appt running behind

## 2018-07-30 NOTE — Progress Notes (Signed)
Electrophysiology TeleHealth Note   Due to national recommendations of social distancing due to COVID 19, an audio/video telehealth visit is felt to be most appropriate for this patient at this time.  See MyChart message from today for the patient's consent to telehealth for [facility].   Date:  07/30/2018   ID:  Travis Kelly, DOB [birth year], MRN [id number]  Location: patient's home  Provider location: [address], [place]  Evaluation Performed: Follow-up visit  PCP:  [name], MD  Cardiologist:  Quay Burow, MD Electrophysiologist:  Dr Rayann Heman  Chief Complaint:  SVT  History of Present Illness:    Travis Kelly is a 85 y.o. male who presents via audio/video conferencing for a telehealth visit today.  Since last being seen in our clinic, the patient reports doing very well.  His wife died in 2022/07/29.  He now lives at [facility]. Today, he denies symptoms of palpitations, chest pain, shortness of breath,  lower extremity edema, dizziness, presyncope, or syncope.  The patient is otherwise without complaint today.  The patient denies symptoms of fevers, chills, cough, or new SOB worrisome for COVID 19.  Past Medical History:  Diagnosis Date  . Age-related macular degeneration, dry, left eye   . Age-related macular degeneration, wet, right eye ([place])   . Arthritis    "right knee" (04/17/2018)  . CHF (congestive heart failure) ([place])   . GERD (gastroesophageal reflux disease)   . Heart murmur   . History of duodenal ulcer   . History of hiatal hernia   . LAFB (left anterior fascicular block)   . Mitral valve disorder    Moderate MR  . Nephrolithiasis 29-Jul-2011  . Pneumonia 07-28-36; ?date   "once as a child; once as an adult" (04/17/2018)  . PSVT (paroxysmal supraventricular tachycardia) ([place]) 07/28/16    Past Surgical History:  Procedure Laterality Date  . CARDIAC CATHETERIZATION    . CATARACT EXTRACTION W/ INTRAOCULAR LENS  IMPLANT, BILATERAL Bilateral  07-29-11  . SKIN BIOPSY Left    "thigh; suspected CA; it was not"    Current Outpatient Medications  Medication Sig Dispense Refill  . acetaminophen (TYLENOL) 500 MG tablet Take 1,000 mg by mouth daily as needed for mild pain.    . Cholecalciferol (VITAMIN D) 2000 units CAPS Take 2,000 Units by mouth daily.    . Cyanocobalamin (VITAMIN B 12 PO) Take 5,000 mg by mouth daily.     . finasteride (PROSCAR) 5 MG tablet Take 5 mg by mouth daily.    . flecainide (TAMBOCOR) 50 MG tablet TAKE 1 TABLET BY MOUTH EVERY 12 HOURS 60 tablet 1  . fluticasone (FLONASE) 50 MCG/ACT nasal spray Place 1-2 sprays into both nostrils daily as needed for allergies or rhinitis.    . furosemide (LASIX) 20 MG tablet TAKE 1 TABLET EVERY DAY 90 tablet 2  . Glucosamine HCl 1000 MG TABS Take 1 tablet by mouth daily.    . hydroxypropyl methylcellulose / hypromellose (ISOPTO TEARS / GONIOVISC) 2.5 % ophthalmic solution Place 1 drop into both eyes 2 (two) times daily.    . Lutein 6 MG CAPS Take 1 capsule by mouth daily.     . meloxicam (MOBIC) 15 MG tablet Take 15 mg by mouth daily as needed for pain.    . metoprolol tartrate (LOPRESSOR) 12.5 mg TABS tablet Take 12.5 mg by mouth 2 (two) times daily.    . Misc Natural Products (TURMERIC CURCUMIN) CAPS Take 1 capsule by  mouth daily.    . Multiple Minerals-Vitamins (CALCIUM-MAGNESIUM-ZINC-D3 PO) Take 1 tablet by mouth daily.    . Multiple Vitamin (MULTIVITAMIN) tablet Take 1 tablet by mouth daily.    . Multiple Vitamins-Minerals (OCUVITE EYE HEALTH FORMULA) CAPS Take 1 tablet by mouth daily.    . naproxen (NAPROSYN) 375 MG tablet Take 375 mg by mouth daily as needed for mild pain.    Marland Kitchen OVER THE COUNTER MEDICATION 1 Container by Mouth Rinse route 2 (two) times daily. [employer] Peroxyl    . RA KRILL OIL 500 MG CAPS Take 500 mg by mouth daily.    . vitamin C (ASCORBIC ACID) 500 MG tablet Take 500 mg by mouth daily.     No current facility-administered medications for this visit.      Allergies:   Rocephin [ceftriaxone sodium in dextrose] and Barbital   Social History:  The patient  reports that he has quit smoking. He has never used smokeless tobacco. He reports current alcohol use of about 3.0 standard drinks of alcohol per week. He reports that he does not use drugs.   Family History:  The patient's  family history includes Cancer in his mother; Pneumonia in his father.   ROS:  Please see the history of present illness.   All other systems are personally reviewed and negative.    Exam:    Vital Signs:  BP 128/72   Pulse 80   Wt 156 lb (70.8 kg)   BMI 25.18 kg/m   Well appearing, alert and conversant, regular work of breathing,  good skin color Eyes- anicteric, neuro- grossly intact, skin- no apparent rash or lesions or cyanosis, mouth- oral mucosa is pink   Labs/Other Tests and Data Reviewed:    Recent Labs: 04/16/2018: ALT 72; B Natriuretic Peptide 1,745.6 04/17/2018: TSH 3.997 04/20/2018: Hemoglobin 11.7; Magnesium 2.0; Platelets 244 05/29/2018: BUN 27; Creatinine, Ser 1.24; NT-Pro BNP 4,532; Potassium 4.2; Sodium 137   Wt Readings from Last 3 Encounters:  07/30/18 156 lb (70.8 kg)  06/06/18 169 lb 3.2 oz (76.7 kg)  05/21/18 163 lb 6.4 oz (74.1 kg)     Other studies personally reviewed: Additional studies/ records that were reviewed today include: my prior notes  Review of the above records today demonstrates: as above   ASSESSMENT & PLAN:    1.  SVT Well controlled with flecainide No changes  2. First degree AV block/ LBBB Following closely Avoid increasing flecainide After COVID 19, we may try to reduce flecainide or metoprolol at that time.  3. Chronic diastolic dysfunction Stable No change required today  4. COVID 19 screen The patient denies symptoms of COVID 19 at this time.  The importance of social distancing was discussed today.  Follow-up:  Return to see me in the office in 6 months  Current medicines are reviewed at length  with the patient today.   The patient does not have concerns regarding his medicines.  The following changes were made today:  none  Labs/ tests ordered today include:  No orders of the defined types were placed in this encounter.   Patient Risk:  after full review of this patients clinical status, I feel that they are at high risk at this time.  Today, I have spent 19 minutes with the patient with telehealth technology discussing SVT .  I have also spoken separately with 2 of his daughters today.  Army Fossa, MD  07/30/2018 1:09 PM     [facility] [address] [place] [address] [phone] (office) [phone] (fax)

## 2018-08-08 ENCOUNTER — Other Ambulatory Visit: Payer: Self-pay | Admitting: Cardiology

## 2018-08-08 NOTE — Telephone Encounter (Signed)
Flecainide 50 mg refilled.

## 2018-09-05 ENCOUNTER — Telehealth: Payer: Medicare HMO | Admitting: Cardiology

## 2018-09-18 ENCOUNTER — Telehealth: Payer: Self-pay | Admitting: Cardiology

## 2018-09-19 NOTE — Telephone Encounter (Signed)
Patient is at [facility]/ call [phone] daughter may be present for virtual/ consent by daughter/ my chart/ pre reg completed

## 2018-09-24 ENCOUNTER — Telehealth (INDEPENDENT_AMBULATORY_CARE_PROVIDER_SITE_OTHER): Payer: Medicare HMO | Admitting: Cardiology

## 2018-09-24 ENCOUNTER — Encounter: Payer: Self-pay | Admitting: Cardiology

## 2018-09-24 ENCOUNTER — Telehealth: Payer: Self-pay

## 2018-09-24 VITALS — BP 118/66 | HR 77 | Ht 67.0 in | Wt 160.0 lb

## 2018-09-24 DIAGNOSIS — I5032 Chronic diastolic (congestive) heart failure: Secondary | ICD-10-CM | POA: Diagnosis not present

## 2018-09-24 DIAGNOSIS — R0609 Other forms of dyspnea: Secondary | ICD-10-CM

## 2018-09-24 DIAGNOSIS — Z8679 Personal history of other diseases of the circulatory system: Secondary | ICD-10-CM

## 2018-09-24 DIAGNOSIS — R0600 Dyspnea, unspecified: Secondary | ICD-10-CM

## 2018-09-24 DIAGNOSIS — I447 Left bundle-branch block, unspecified: Secondary | ICD-10-CM

## 2018-09-24 DIAGNOSIS — N183 Chronic kidney disease, stage 3 unspecified: Secondary | ICD-10-CM

## 2018-09-24 DIAGNOSIS — I340 Nonrheumatic mitral (valve) insufficiency: Secondary | ICD-10-CM

## 2018-09-24 NOTE — Telephone Encounter (Signed)
VERBAL CONSENT GIVEN TO [facility] ON 09/19/2018 AT 10:19AM     Virtual Visit Pre-Appointment Phone Call  "MR [name], I am calling you today to discuss your upcoming appointment. We are currently trying to limit exposure to the virus that causes COVID-19 by seeing patients at home rather than in the office."  1. "What is the BEST phone number to call the day of the visit?" - include this in appointment notes  2. "Do you have or have access to (through a family member/friend) a smartphone with video capability that we can use for your visit?" a. If yes - list this number in appt notes as "cell" (if different from BEST phone #) and list the appointment type as a VIDEO visit in appointment notes b. If no - list the appointment type as a PHONE visit in appointment notes  3. Confirm consent - "In the setting of the current Covid19 crisis, you are scheduled for a (phone or video) visit with your provider on (date) at (time).  Just as we do with many in-office visits, in order for you to participate in this visit, we must obtain consent.  If you'd like, I can send this to your mychart (if signed up) or email for you to review.  Otherwise, I can obtain your verbal consent now.  All virtual visits are billed to your insurance company just like a normal visit would be.  By agreeing to a virtual visit, we'd like you to understand that the technology does not allow for your provider to perform an examination, and thus may limit your provider's ability to fully assess your condition. If your provider identifies any concerns that need to be evaluated in person, we will make arrangements to do so.  Finally, though the technology is pretty good, we cannot assure that it will always work on either your or our end, and in the setting of a video visit, we may have to convert it to a phone-only visit.  In either situation, we cannot ensure that we have a secure connection.  Are you willing to proceed?" STAFF: Did the  patient verbally acknowledge consent to telehealth visit? Document YES/NO here: YES  4. Advise patient to be prepared - "Two hours prior to your appointment, go ahead and check your blood pressure, pulse, oxygen saturation, and your weight (if you have the equipment to check those) and write them all down. When your visit starts, your provider will ask you for this information. If you have an Apple Watch or Kardia device, please plan to have heart rate information ready on the day of your appointment. Please have a pen and paper handy nearby the day of the visit as well."  5. Give patient instructions for MyChart download to smartphone OR Doximity/Doxy.me as below if video visit (depending on what platform provider is using)  6. Inform patient they will receive a phone call 15 minutes prior to their appointment time (may be from unknown caller ID) so they should be prepared to answer    TELEPHONE CALL NOTE  Travis Kelly has been deemed a candidate for a follow-up tele-health visit to limit community exposure during the Covid-19 pandemic. I spoke with the patient via phone to ensure availability of phone/video source, confirm preferred email & phone number, and discuss instructions and expectations.  I reminded Travis Kelly to be prepared with any vital sign and/or heart rhythm information that could potentially be obtained via home monitoring, at the time of his  visit. I reminded Travis Kelly to expect a phone call prior to his visit.  Travis Kelly, CMA 09/24/2018 2:30 PM   INSTRUCTIONS FOR DOWNLOADING THE MYCHART APP TO SMARTPHONE  - The patient must first make sure to have activated MyChart and know their login information - If Apple, go to [employer] and type in MyChart in the search bar and download the app. If Android, ask patient to go to [employer] and type in [place] in the search bar and download the app. The app is free but as with any other app downloads, their phone  may require them to verify saved payment information or Apple/Android password.  - The patient will need to then log into the app with their MyChart username and password, and select [facility] as their healthcare provider to link the account. When it is time for your visit, go to the MyChart app, find appointments, and click Begin Video Visit. Be sure to Select Allow for your device to access the Microphone and Camera for your visit. You will then be connected, and your provider will be with you shortly.  **If they have any issues connecting, or need assistance please contact MyChart service desk [phone])**  **If using a computer, in order to ensure the best quality for their visit they will need to use either of the following Internet Browsers: [employer], or Google Chrome**  IF USING DOXIMITY or DOXY.ME - The patient will receive a link just prior to their visit by text.     FULL LENGTH CONSENT FOR TELE-HEALTH VISIT   I hereby voluntarily request, consent and authorize [facility] and its employed or contracted physicians, physician assistants, nurse practitioners or other licensed health care professionals (the Practitioner), to provide me with telemedicine health care services (the "Services") as deemed necessary by the treating Practitioner. I acknowledge and consent to receive the Services by the Practitioner via telemedicine. I understand that the telemedicine visit will involve communicating with the Practitioner through live audiovisual communication technology and the disclosure of certain medical information by electronic transmission. I acknowledge that I have been given the opportunity to request an in-person assessment or other available alternative prior to the telemedicine visit and am voluntarily participating in the telemedicine visit.  I understand that I have the right to withhold or withdraw my consent to the use of telemedicine in the course of my  care at any time, without affecting my right to future care or treatment, and that the Practitioner or I may terminate the telemedicine visit at any time. I understand that I have the right to inspect all information obtained and/or recorded in the course of the telemedicine visit and may receive copies of available information for a reasonable fee.  I understand that some of the potential risks of receiving the Services via telemedicine include:  Marland Kitchen Delay or interruption in medical evaluation due to technological equipment failure or disruption; . Information transmitted may not be sufficient (e.g. poor resolution of images) to allow for appropriate medical decision making by the Practitioner; and/or  . In rare instances, security protocols could fail, causing a breach of personal health information.  Furthermore, I acknowledge that it is my responsibility to provide information about my medical history, conditions and care that is complete and accurate to the best of my ability. I acknowledge that Practitioner's advice, recommendations, and/or decision may be based on factors not within their control, such as incomplete or inaccurate data provided by me  or distortions of diagnostic images or specimens that may result from electronic transmissions. I understand that the practice of medicine is not an exact science and that Practitioner makes no warranties or guarantees regarding treatment outcomes. I acknowledge that I will receive a copy of this consent concurrently upon execution via email to the email address I last provided but may also request a printed copy by calling the office of [facility].    I understand that my insurance will be billed for this visit.   I have read or had this consent read to me. . I understand the contents of this consent, which adequately explains the benefits and risks of the Services being provided via telemedicine.  . I have been provided ample opportunity to ask  questions regarding this consent and the Services and have had my questions answered to my satisfaction. . I give my informed consent for the services to be provided through the use of telemedicine in my medical care  By participating in this telemedicine visit I agree to the above.

## 2018-09-24 NOTE — Patient Instructions (Addendum)
Medication Instructions:  Your physician recommends that you continue on your current medications as directed. Please refer to the Current Medication list given to you today. If you need a refill on your cardiac medications before your next appointment, please call your pharmacy.   Lab work: Your physician recommends that you return for lab work in: TODAY BMET, BNP If you have labs (blood work) drawn today and your tests are completely normal, you will receive your results only by: Marland Kitchen MyChart Message (if you have MyChart) OR . A paper copy in the mail If you have any lab test that is abnormal or we need to change your treatment, we will call you to review the results.  Testing/Procedures: None  Follow-Up: At [facility], you and your health needs are our priority.  As part of our continuing mission to provide you with exceptional heart care, we have created designated Provider Care Teams.  These Care Teams include your primary Cardiologist (physician) and Advanced Practice Providers (APPs -  Physician Assistants and Nurse Practitioners) who all work together to provide you with the care you need, when you need it. You will need a follow up appointment in 4 months. Please call our office 2 months in advance to schedule this appointment.  You may see Dr Rayann Heman or one of the following Advanced Practice Providers on your designated Care Team:   Chanetta Marshall, NP . Tommye Standard, PA-C  Any Other Special Instructions Will Be Listed Below (If Applicable).

## 2018-09-24 NOTE — Progress Notes (Signed)
Virtual Visit via Video Note   This visit type was conducted due to national recommendations for restrictions regarding the COVID-19 Pandemic (e.g. social distancing) in an effort to limit this patient's exposure and mitigate transmission in our community.  Due to his co-morbid illnesses, this patient is at least at moderate risk for complications without adequate follow up.  This format is felt to be most appropriate for this patient at this time.  All issues noted in this document were discussed and addressed.  A limited physical exam was performed with this format.  Please refer to the patient's chart for his consent to telehealth for [facility].   Date:  09/24/2018   ID:  Travis Kelly, DOB [birth year], MRN [id number]  Patient Location: [place] Provider Location: Office  PCP:  [name], MD  Cardiologist:  Quay Burow, MD  Electrophysiologist:  Dr Rayann Heman  Evaluation Performed:  Follow-Up Visit  Chief Complaint:  DOE  History of Present Illness:    Travis Kelly is a 85 y.o. male, lives at [facility],  with a history of PSVT and chronic diastolic CHF. He had moderate mitral regurgitation with normal LVF and moderate pulmonary HTN by echo Jan 2020. He wishes no invasive procedures for his mitral valve. He was admitted through the ED 04/16/18 with recurrent PSVT and CHF. He was seen by Dr Rayann Heman and options were discussed.  The patient and family again expressed they're wish to avoid invasive Rx and they opted for Flecainide. He has done well on this since.  Dr Rayann Heman saw him in April 2020.  He admits to some DOE but no orthopnea.  He has chronic LE edema.  He has been self adjusting his Lasix to keep his weight around 160 lbs.  He has not had any recurrent tachycardia episodes.    Past Medical History:  Diagnosis Date  . Age-related macular degeneration, dry, left eye   . Age-related macular degeneration, wet, right eye ([place])   . Arthritis    "right  knee" (04/17/2018)  . CHF (congestive heart failure) ([place])   . GERD (gastroesophageal reflux disease)   . Heart murmur   . History of duodenal ulcer   . History of hiatal hernia   . LAFB (left anterior fascicular block)   . Mitral valve disorder    Moderate MR  . Nephrolithiasis 2013  . Pneumonia 1938; ?date   "once as a child; once as an adult" (04/17/2018)  . PSVT (paroxysmal supraventricular tachycardia) ([place]) 06/2016   Past Surgical History:  Procedure Laterality Date  . CARDIAC CATHETERIZATION    . CATARACT EXTRACTION W/ INTRAOCULAR LENS  IMPLANT, BILATERAL Bilateral 2013  . SKIN BIOPSY Left    "thigh; suspected CA; it was not"     Current Meds  Medication Sig  . acetaminophen (TYLENOL) 500 MG tablet Take 1,000 mg by mouth daily as needed for mild pain.  . Cholecalciferol (VITAMIN D) 2000 units CAPS Take 2,000 Units by mouth daily.  . Cyanocobalamin (VITAMIN B 12 PO) Take 5,000 mg by mouth daily.   . finasteride (PROSCAR) 5 MG tablet Take 5 mg by mouth daily.  . flecainide (TAMBOCOR) 50 MG tablet TAKE 1 TABLET BY MOUTH EVERY 12 HOURS  . fluticasone (FLONASE) 50 MCG/ACT nasal spray Place 1-2 sprays into both nostrils daily as needed for allergies or rhinitis.  . furosemide (LASIX) 20 MG tablet TAKE 1 TABLET EVERY DAY (Patient taking differently: Take 20 mg by mouth daily. )  . GLUCOSAMINE  HCL PO Take 1 tablet by mouth daily.   . hydroxypropyl methylcellulose / hypromellose (ISOPTO TEARS / GONIOVISC) 2.5 % ophthalmic solution Place 1 drop into both eyes 2 (two) times daily.  . Lutein 20 MG CAPS Take 1 capsule by mouth daily.   . meloxicam (MOBIC) 15 MG tablet Take 15 mg by mouth daily as needed for pain.  . metoprolol tartrate (LOPRESSOR) 12.5 mg TABS tablet Take 12.5 mg by mouth 2 (two) times daily.  . Misc Natural Products (TURMERIC CURCUMIN) CAPS Take 1 capsule by mouth daily.  . Multiple Minerals-Vitamins (CALCIUM-MAGNESIUM-ZINC-D3 PO) Take 1 tablet by mouth daily.  .  Multiple Vitamin (MULTIVITAMIN) tablet Take 1 tablet by mouth daily.  . Multiple Vitamins-Minerals (OCUVITE EYE HEALTH FORMULA) CAPS Take 1 tablet by mouth daily.  . naproxen (NAPROSYN) 375 MG tablet Take 375 mg by mouth daily as needed for mild pain.  Marland Kitchen OVER THE COUNTER MEDICATION 1 Container by Mouth Rinse route 2 (two) times daily. [employer] Peroxyl  . RA KRILL OIL 500 MG CAPS Take 500 mg by mouth daily.  . vitamin C (ASCORBIC ACID) 500 MG tablet Take 500 mg by mouth daily.     Allergies:   Rocephin [ceftriaxone sodium in dextrose] and Barbital   Social History   Tobacco Use  . Smoking status: Former Research scientist (life sciences)  . Smokeless tobacco: Never Used  . Tobacco comment: 04/17/2018 "quit smoking when I was 14"  Substance Use Topics  . Alcohol use: Yes    Alcohol/week: 3.0 standard drinks    Types: 3 Glasses of wine per week  . Drug use: Never     Family Hx: The patient's family history includes Cancer in his mother; Pneumonia in his father.  ROS:   Please see the history of present illness.    All other systems reviewed and are negative.   Prior CV studies:   The following studies were reviewed today: Echo Jan 2020  Labs/Other Tests and Data Reviewed:    EKG:  No ECG reviewed.  Recent Labs: 04/16/2018: ALT 72; B Natriuretic Peptide 1,745.6 04/17/2018: TSH 3.997 04/20/2018: Hemoglobin 11.7; Magnesium 2.0; Platelets 244 05/29/2018: BUN 27; Creatinine, Ser 1.24; NT-Pro BNP 4,532; Potassium 4.2; Sodium 137   Recent Lipid Panel Lab Results  Component Value Date/Time   CHOL 141 04/17/2018 02:54 AM   TRIG 41 04/17/2018 02:54 AM   HDL 53 04/17/2018 02:54 AM   CHOLHDL 2.7 04/17/2018 02:54 AM   LDLCALC 80 04/17/2018 02:54 AM    Wt Readings from Last 3 Encounters:  09/24/18 160 lb (72.6 kg)  07/30/18 156 lb (70.8 kg)  06/06/18 169 lb 3.2 oz (76.7 kg)     Objective:    Vital Signs:  BP 118/66   Pulse 77   Ht 5\' 7"  (1.702 m)   Wt 160 lb (72.6 kg)   BMI 25.06 kg/m    VITAL SIGNS:   reviewed  ASSESSMENT & PLAN:    History of PSVT  PSVT- Flecainide added Jan 2020 (declined RFA)-holding NSR  Acute on chronic combined systolic and diastolic CHF (congestive heart failure) ([place])  CHF when admitted Jan 2020 with PSVT-CXR then showed vascular congestion- NT Pro BNP was 7252 in Jan- 4532 in Feb.   Chronic renal insufficiency, stage 3 (moderate) (HCC) Transient AKI with diuresis Jan 2020- Lasix held. SCr down to 1.24 on 05/29/18  Moderate mitral regurgitation Moderate mitral regurgitation with normal LVF by echo Jan 2020 Also mild AS and moderate pulmonary HTN (PA 50 mmHg)  LBBB Dr Rayann Heman had mentioned possibly decreasing metoprolol or stopping Flecainide at some point.   COVID-19 Education: The signs and symptoms of COVID-19 were discussed with the patient and how to seek care for testing (follow up with PCP or arrange E-visit).  The importance of social distancing was discussed today.  Time:   Today, I have spent 20 minutes with the patient with telehealth technology discussing the above problems.     Medication Adjustments/Labs and Tests Ordered: Current medicines are reviewed at length with the patient today.  Concerns regarding medicines are outlined above.   Tests Ordered: No orders of the defined types were placed in this encounter.   Medication Changes: No orders of the defined types were placed in this encounter.   Follow Up:  In Person Dr Rayann Heman in October. Check BMP and BNP   Signed, Kerin Ransom, PA-C  09/24/2018 10:35 AM    [place]

## 2018-09-24 NOTE — Telephone Encounter (Signed)
Contacted patient to discuss AVS Instructions. Gave her Luke's recommendations from today's office visit. Informed patient that someone from the scheduling dept will be contacting him to schedule their follow up appt. He voiced understanding and AVS mailed to patient.

## 2018-10-03 DIAGNOSIS — Z8679 Personal history of other diseases of the circulatory system: Secondary | ICD-10-CM | POA: Diagnosis not present

## 2018-10-03 DIAGNOSIS — R0609 Other forms of dyspnea: Secondary | ICD-10-CM | POA: Diagnosis not present

## 2018-10-03 DIAGNOSIS — I5032 Chronic diastolic (congestive) heart failure: Secondary | ICD-10-CM | POA: Diagnosis not present

## 2018-10-03 DIAGNOSIS — I447 Left bundle-branch block, unspecified: Secondary | ICD-10-CM | POA: Diagnosis not present

## 2018-10-03 DIAGNOSIS — N183 Chronic kidney disease, stage 3 (moderate): Secondary | ICD-10-CM | POA: Diagnosis not present

## 2018-10-03 DIAGNOSIS — I340 Nonrheumatic mitral (valve) insufficiency: Secondary | ICD-10-CM | POA: Diagnosis not present

## 2018-10-04 LAB — PRO B NATRIURETIC PEPTIDE: NT-Pro BNP: 4125 pg/mL — ABNORMAL HIGH (ref 0–486)

## 2018-10-04 LAB — BASIC METABOLIC PANEL
BUN/Creatinine Ratio: 23 (ref 10–24)
BUN: 32 mg/dL (ref 10–36)
CO2: 24 mmol/L (ref 20–29)
Calcium: 9 mg/dL (ref 8.6–10.2)
Chloride: 100 mmol/L (ref 96–106)
Creatinine, Ser: 1.39 mg/dL — ABNORMAL HIGH (ref 0.76–1.27)
GFR calc Af Amer: 50 mL/min/{1.73_m2} — ABNORMAL LOW (ref >59)
GFR calc non Af Amer: 43 mL/min/{1.73_m2} — ABNORMAL LOW (ref >59)
Glucose: 106 mg/dL — ABNORMAL HIGH (ref 65–99)
Potassium: 5 mmol/L (ref 3.5–5.2)
Sodium: 141 mmol/L (ref 134–144)

## 2018-10-15 ENCOUNTER — Other Ambulatory Visit: Payer: Self-pay

## 2018-10-15 DIAGNOSIS — N183 Chronic kidney disease, stage 3 unspecified: Secondary | ICD-10-CM

## 2018-10-18 DIAGNOSIS — N390 Urinary tract infection, site not specified: Secondary | ICD-10-CM | POA: Diagnosis not present

## 2018-10-22 DIAGNOSIS — N400 Enlarged prostate without lower urinary tract symptoms: Secondary | ICD-10-CM | POA: Diagnosis not present

## 2018-10-22 DIAGNOSIS — R030 Elevated blood-pressure reading, without diagnosis of hypertension: Secondary | ICD-10-CM | POA: Diagnosis not present

## 2018-10-22 DIAGNOSIS — R300 Dysuria: Secondary | ICD-10-CM | POA: Diagnosis not present

## 2018-10-23 ENCOUNTER — Telehealth: Payer: Self-pay

## 2018-10-23 NOTE — Telephone Encounter (Signed)
-----   Message from Erlene Quan, [place] sent at 10/19/2018 12:13 PM EDT ----- T Mr Makela needs an office visit with me the last week of July, can you arrange please?  Thanks   [employer]

## 2018-10-23 NOTE — Telephone Encounter (Signed)
Left message on Travis Kelly, patients daughter, phone advising her to contact the office to schedule her dad a follow up appt with Kerin Ransom at the end of the month. Spoke with patient and he stated he will make sure his daughter gives the office a call back to schedule his follow up appt.

## 2018-10-26 DIAGNOSIS — Z961 Presence of intraocular lens: Secondary | ICD-10-CM | POA: Diagnosis not present

## 2018-10-26 DIAGNOSIS — H0100B Unspecified blepharitis left eye, upper and lower eyelids: Secondary | ICD-10-CM | POA: Diagnosis not present

## 2018-10-26 DIAGNOSIS — H353122 Nonexudative age-related macular degeneration, left eye, intermediate dry stage: Secondary | ICD-10-CM | POA: Diagnosis not present

## 2018-10-26 DIAGNOSIS — R3121 Asymptomatic microscopic hematuria: Secondary | ICD-10-CM | POA: Diagnosis not present

## 2018-10-26 DIAGNOSIS — N402 Nodular prostate without lower urinary tract symptoms: Secondary | ICD-10-CM | POA: Diagnosis not present

## 2018-10-26 DIAGNOSIS — H0100A Unspecified blepharitis right eye, upper and lower eyelids: Secondary | ICD-10-CM | POA: Diagnosis not present

## 2018-10-26 DIAGNOSIS — N200 Calculus of kidney: Secondary | ICD-10-CM | POA: Diagnosis not present

## 2018-10-26 DIAGNOSIS — H353210 Exudative age-related macular degeneration, right eye, stage unspecified: Secondary | ICD-10-CM | POA: Diagnosis not present

## 2018-10-26 DIAGNOSIS — R300 Dysuria: Secondary | ICD-10-CM | POA: Diagnosis not present

## 2018-10-26 DIAGNOSIS — N138 Other obstructive and reflux uropathy: Secondary | ICD-10-CM | POA: Diagnosis not present

## 2018-10-26 DIAGNOSIS — H43812 Vitreous degeneration, left eye: Secondary | ICD-10-CM | POA: Diagnosis not present

## 2018-10-26 DIAGNOSIS — N401 Enlarged prostate with lower urinary tract symptoms: Secondary | ICD-10-CM | POA: Diagnosis not present

## 2018-10-29 DIAGNOSIS — R339 Retention of urine, unspecified: Secondary | ICD-10-CM | POA: Diagnosis not present

## 2018-10-30 DIAGNOSIS — N183 Chronic kidney disease, stage 3 (moderate): Secondary | ICD-10-CM | POA: Diagnosis not present

## 2018-10-30 LAB — BASIC METABOLIC PANEL
BUN/Creatinine Ratio: 18 (ref 10–24)
BUN: 32 mg/dL (ref 10–36)
CO2: 24 mmol/L (ref 20–29)
Calcium: 9.3 mg/dL (ref 8.6–10.2)
Chloride: 99 mmol/L (ref 96–106)
Creatinine, Ser: 1.76 mg/dL — ABNORMAL HIGH (ref 0.76–1.27)
GFR calc Af Amer: 37 mL/min/{1.73_m2} — ABNORMAL LOW (ref >59)
GFR calc non Af Amer: 32 mL/min/{1.73_m2} — ABNORMAL LOW (ref >59)
Glucose: 73 mg/dL (ref 65–99)
Potassium: 4.8 mmol/L (ref 3.5–5.2)
Sodium: 139 mmol/L (ref 134–144)

## 2018-11-05 ENCOUNTER — Telehealth: Payer: Self-pay | Admitting: Cardiology

## 2018-11-05 ENCOUNTER — Other Ambulatory Visit: Payer: Self-pay

## 2018-11-05 ENCOUNTER — Ambulatory Visit (INDEPENDENT_AMBULATORY_CARE_PROVIDER_SITE_OTHER): Payer: Medicare HMO | Admitting: Cardiology

## 2018-11-05 VITALS — BP 125/65 | HR 73 | Temp 97.9°F | Ht 67.0 in | Wt 164.0 lb

## 2018-11-05 DIAGNOSIS — I5032 Chronic diastolic (congestive) heart failure: Secondary | ICD-10-CM | POA: Diagnosis not present

## 2018-11-05 DIAGNOSIS — Z8679 Personal history of other diseases of the circulatory system: Secondary | ICD-10-CM

## 2018-11-05 DIAGNOSIS — N183 Chronic kidney disease, stage 3 unspecified: Secondary | ICD-10-CM

## 2018-11-05 DIAGNOSIS — I340 Nonrheumatic mitral (valve) insufficiency: Secondary | ICD-10-CM

## 2018-11-05 MED ORDER — FLECAINIDE ACETATE 50 MG PO TABS: 50.0000 mg | ORAL_TABLET | Freq: Two times a day (BID) | ORAL | 3 refills | Status: DC

## 2018-11-05 NOTE — Assessment & Plan Note (Signed)
PSVT- Flecainide added Jan 2020 (declined RFA)

## 2018-11-05 NOTE — Assessment & Plan Note (Signed)
His SCR has drifted up but that was when he just started doing self caths for outlet obstruction

## 2018-11-05 NOTE — Telephone Encounter (Signed)

## 2018-11-05 NOTE — Assessment & Plan Note (Signed)
CHF when admitted Jan 2020 with PSVT- Compensated on exam- he takes lasix 20 mg 6 days a week

## 2018-11-05 NOTE — Progress Notes (Signed)
Cardiology Office Note:    Date:  11/05/2018   ID:  Travis Kelly, DOB May 27, 1922, MRN [id number]  PCP:  [name], MD  Cardiologist:  Quay Burow, MD  Electrophysiologist:  Thompson Grayer, MD   Referring MD: [name], MD   No chief complaint on file.   History of Present Illness:    Travis Kelly is a 85 y.o. male, lives at [employer] [facility],  retired Software engineer, with a history of PSVT and chronic diastolic CHF. His wife of 58 years passed in March this year. Echo Jan 2020 showed moderate MR with normal LVF and moderate pulmonary HTN by  He wishes no invasive procedures for his mitral valve.   He was admitted through the ED 04/16/18 with recurrent PSVT and CHF. He was seen by Dr Rayann Heman and options were discussed. The patient and family again expressed they're wish to avoid invasive Rx and they opted for Flecainide. He has done well on this since from a rhythm standpoint since.  He did tell me he has occasional "halucinations" which he attributes to Flecainide.  These last "only a few seconds" and consist of him feeling like he has moved to a different location and back.   He recently developed urinary retention.  He is being followed by Dr Lawerance Bach.  He has been doing self caths 3 times a day.  He admits to some DOE but no orthopnea.  He has chronic mild LE edema.  He tells me he doesn't think he is getting enough urine out recently, he noted high volume when he first started.  On reviewing his labs his SCr had gone up to 1.77 but this was around the time he had started doing the self caths.    Past Medical History:  Diagnosis Date  . Age-related macular degeneration, dry, left eye   . Age-related macular degeneration, wet, right eye ([place])   . Arthritis    "right knee" (04/17/2018)  . CHF (congestive heart failure) ([place])   . GERD (gastroesophageal reflux disease)   . Heart murmur   . History of duodenal ulcer   . History of hiatal hernia   . LAFB (left anterior fascicular  block)   . Mitral valve disorder    Moderate MR  . Nephrolithiasis 2013  . Pneumonia 1938; ?date   "once as a child; once as an adult" (04/17/2018)  . PSVT (paroxysmal supraventricular tachycardia) ([place]) 06/2016    Past Surgical History:  Procedure Laterality Date  . CARDIAC CATHETERIZATION    . CATARACT EXTRACTION W/ INTRAOCULAR LENS  IMPLANT, BILATERAL Bilateral 2013  . SKIN BIOPSY Left    "thigh; suspected CA; it was not"    Current Medications: Current Meds  Medication Sig  . acetaminophen (TYLENOL) 500 MG tablet Take 1,000 mg by mouth daily as needed for mild pain.  . Cholecalciferol (VITAMIN D) 2000 units CAPS Take 2,000 Units by mouth daily.  . Cyanocobalamin (VITAMIN B 12 PO) Take 5,000 mg by mouth daily.   . finasteride (PROSCAR) 5 MG tablet Take 5 mg by mouth daily.  . flecainide (TAMBOCOR) 50 MG tablet Take 1 tablet (50 mg total) by mouth every 12 (twelve) hours.  . fluticasone (FLONASE) 50 MCG/ACT nasal spray Place 1-2 sprays into both nostrils daily as needed for allergies or rhinitis.  . furosemide (LASIX) 20 MG tablet TAKE 1 TABLET EVERY DAY (Patient taking differently: Take 20 mg by mouth daily. )  . GLUCOSAMINE HCL PO Take 1 tablet by mouth daily.   Marland Kitchen  hydroxypropyl methylcellulose / hypromellose (ISOPTO TEARS / GONIOVISC) 2.5 % ophthalmic solution Place 1 drop into both eyes 2 (two) times daily.  . Lutein 20 MG CAPS Take 1 capsule by mouth daily.   . meloxicam (MOBIC) 15 MG tablet Take 15 mg by mouth daily as needed for pain.  . metoprolol tartrate (LOPRESSOR) 12.5 mg TABS tablet Take 12.5 mg by mouth 2 (two) times daily.  . Misc Natural Products (TURMERIC CURCUMIN) CAPS Take 1 capsule by mouth daily.  . Multiple Minerals-Vitamins (CALCIUM-MAGNESIUM-ZINC-D3 PO) Take 1 tablet by mouth daily.  . Multiple Vitamin (MULTIVITAMIN) tablet Take 1 tablet by mouth daily.  . Multiple Vitamins-Minerals (OCUVITE EYE HEALTH FORMULA) CAPS Take 1 tablet by mouth daily.  . naproxen  (NAPROSYN) 375 MG tablet Take 375 mg by mouth daily as needed for mild pain.  Marland Kitchen OVER THE COUNTER MEDICATION 1 Container by Mouth Rinse route 2 (two) times daily. [employer] Peroxyl  . RA KRILL OIL 500 MG CAPS Take 500 mg by mouth daily.  . vitamin C (ASCORBIC ACID) 500 MG tablet Take 500 mg by mouth daily.  . [DISCONTINUED] flecainide (TAMBOCOR) 50 MG tablet TAKE 1 TABLET BY MOUTH EVERY 12 HOURS     Allergies:   Rocephin [ceftriaxone sodium in dextrose] and Barbital   Social History   Socioeconomic History  . Marital status: Married    Spouse name: Not on file  . Number of children: Not on file  . Years of education: Not on file  . Highest education level: Not on file  Occupational History  . Not on file  Social Needs  . Financial resource strain: Not on file  . Food insecurity    Worry: Not on file    Inability: Not on file  . Transportation needs    Medical: Not on file    Non-medical: Not on file  Tobacco Use  . Smoking status: Former Research scientist (life sciences)  . Smokeless tobacco: Never Used  . Tobacco comment: 04/17/2018 "quit smoking when I was 14"  Substance and Sexual Activity  . Alcohol use: Yes    Alcohol/week: 3.0 standard drinks    Types: 3 Glasses of wine per week  . Drug use: Never  . Sexual activity: Not on file  Lifestyle  . Physical activity    Days per week: Not on file    Minutes per session: Not on file  . Stress: Not on file  Relationships  . Social Herbalist on phone: Not on file    Gets together: Not on file    Attends religious service: Not on file    Active member of club or organization: Not on file    Attends meetings of clubs or organizations: Not on file    Relationship status: Not on file  Other Topics Concern  . Not on file  Social History Narrative  . Not on file     Family History: The patient's family history includes Cancer in his mother; Pneumonia in his father.  ROS:   Please see the history of present illness.     All other systems  reviewed and are negative.  EKGs/Labs/Other Studies Reviewed:    The following studies were reviewed today:   EKG:  EKG is ordered today.  The ekg ordered today demonstrates NSR, 1st AVB, LBBB, QTc 480  Recent Labs: 04/16/2018: ALT 72; B Natriuretic Peptide 1,745.6 04/17/2018: TSH 3.997 04/20/2018: Hemoglobin 11.7; Magnesium 2.0; Platelets 244 10/03/2018: NT-Pro BNP 4,125 10/30/2018: BUN 32; Creatinine, Ser  1.76; Potassium 4.8; Sodium 139  Recent Lipid Panel    Component Value Date/Time   CHOL 141 04/17/2018 0254   TRIG 41 04/17/2018 0254   HDL 53 04/17/2018 0254   CHOLHDL 2.7 04/17/2018 0254   VLDL 8 04/17/2018 0254   LDLCALC 80 04/17/2018 0254    Physical Exam:    VS:  BP 125/65   Pulse 73   Temp 97.9 F (36.6 C)   Ht 5\' 7"  (1.702 m)   Wt 164 lb (74.4 kg)   SpO2 93%   BMI 25.69 kg/m     Wt Readings from Last 3 Encounters:  11/05/18 164 lb (74.4 kg)  09/24/18 160 lb (72.6 kg)  07/30/18 156 lb (70.8 kg)     GEN: Elderly male, well developed in no acute distress HEENT: Normal NECK: No JVD; No carotid bruits LYMPHATICS: No lymphadenopathy CARDIAC: RR, 2/6 MR murmur rubs, gallops RESPIRATORY:  Kyphosis, basilar crackles on Lt ABDOMEN: Soft, non-tender, non-distended MUSCULOSKELETAL:  Trace if any edema; No deformity  SKIN: Warm and dry NEUROLOGIC:  Alert and oriented x 3 PSYCHIATRIC:  Normal affect   ASSESSMENT:    Chronic diastolic CHF (congestive heart failure) (HCC)  CHF when admitted Jan 2020 with PSVT- Compensated on exam- he takes lasix 20 mg 6 days a week  Chronic renal insufficiency, stage 3 (moderate) (HCC) His SCR has drifted up but that was when he just started doing self caths for outlet obstruction  History of PSVT  PSVT- Flecainide added Jan 2020 (declined RFA)  Moderate mitral regurgitation Moderate mitral regurgitation with normal LVF by echo Jan 2020 Also mild AS and moderate pulmonary HTN (PA 50 mmHg)  PLAN:    I suggested he have a  BMP and BNP drawn prior to his annual physical with his PCP next month.  No other change for now.    Medication Adjustments/Labs and Tests Ordered: Current medicines are reviewed at length with the patient today.  Concerns regarding medicines are outlined above.  No orders of the defined types were placed in this encounter.  Meds ordered this encounter  Medications  . flecainide (TAMBOCOR) 50 MG tablet    Sig: Take 1 tablet (50 mg total) by mouth every 12 (twelve) hours.    Dispense:  180 tablet    Refill:  3    Patient Instructions  Medication Instructions:  Your physician recommends that you continue on your current medications as directed. Please refer to the Current Medication list given to you today. If you need a refill on your cardiac medications before your next appointment, please call your pharmacy.   Lab work: Have your pcp draw a Bmet and Bnp If you have labs (blood work) drawn today and your tests are completely normal, you will receive your results only by: Marland Kitchen MyChart Message (if you have MyChart) OR . A paper copy in the mail If you have any lab test that is abnormal or we need to change your treatment, we will call you to review the results.  Testing/Procedures: None   Follow-Up: At [facility], you and your health needs are our priority.  As part of our continuing mission to provide you with exceptional heart care, we have created designated Provider Care Teams.  These Care Teams include your primary Cardiologist (physician) and Advanced Practice Providers (APPs -  Physician Assistants and Nurse Practitioners) who all work together to provide you with the care you need, when you need it. . Follow up as scheduled  Any Other Special Instructions Will Be Listed Below (If Applicable).      Signed, Kerin Ransom, PA-C  11/05/2018 5:13 PM    [facility]

## 2018-11-05 NOTE — Assessment & Plan Note (Signed)
Moderate mitral regurgitation with normal LVF by echo Jan 2020 Also mild AS and moderate pulmonary HTN (PA 50 mmHg)

## 2018-11-05 NOTE — Patient Instructions (Signed)
Medication Instructions:  Your physician recommends that you continue on your current medications as directed. Please refer to the Current Medication list given to you today. If you need a refill on your cardiac medications before your next appointment, please call your pharmacy.   Lab work: Have your pcp draw a Bmet and Bnp If you have labs (blood work) drawn today and your tests are completely normal, you will receive your results only by: Marland Kitchen MyChart Message (if you have MyChart) OR . A paper copy in the mail If you have any lab test that is abnormal or we need to change your treatment, we will call you to review the results.  Testing/Procedures: None   Follow-Up: At [facility], you and your health needs are our priority.  As part of our continuing mission to provide you with exceptional heart care, we have created designated Provider Care Teams.  These Care Teams include your primary Cardiologist (physician) and Advanced Practice Providers (APPs -  Physician Assistants and Nurse Practitioners) who all work together to provide you with the care you need, when you need it. . Follow up as scheduled   Any Other Special Instructions Will Be Listed Below (If Applicable).

## 2018-11-05 NOTE — Telephone Encounter (Signed)
Message routed to the provider and his assist.  The daughter and father have both been prescreened and will be wearing their masks.      COVID-19 Pre-Screening Questions:  . In the past 7 to 10 days have you had a cough,  shortness of breath, headache, congestion, fever (100 or greater) body aches, chills, sore throat, or sudden loss of taste or sense of smell? No . Have you been around anyone with known Covid 19. No . Have you been around anyone who is awaiting Covid 19 test results in the past 7 to 10 days? No . Have you been around anyone who has been exposed to Covid 19, or has mentioned symptoms of Covid 19 within the past 7 to 10 days? No  If you have any concerns/questions about symptoms patients report during screening (either on the phone or at threshold). Contact the provider seeing the patient or DOD for further guidance.  If neither are available contact a member of the leadership team.

## 2018-11-05 NOTE — Telephone Encounter (Signed)
New Message:    Daughter says she must come in with pt for his appointment. She says he needs help with his appointment.

## 2018-11-06 NOTE — Addendum Note (Signed)
Addended by: Venetia Maxon on: 11/06/2018 03:47 PM   Modules accepted: Orders

## 2018-11-07 DIAGNOSIS — Z1159 Encounter for screening for other viral diseases: Secondary | ICD-10-CM | POA: Diagnosis not present

## 2018-11-16 DIAGNOSIS — N401 Enlarged prostate with lower urinary tract symptoms: Secondary | ICD-10-CM | POA: Diagnosis not present

## 2018-11-16 DIAGNOSIS — N138 Other obstructive and reflux uropathy: Secondary | ICD-10-CM | POA: Diagnosis not present

## 2018-11-16 DIAGNOSIS — N200 Calculus of kidney: Secondary | ICD-10-CM | POA: Diagnosis not present

## 2018-11-16 DIAGNOSIS — N402 Nodular prostate without lower urinary tract symptoms: Secondary | ICD-10-CM | POA: Diagnosis not present

## 2018-11-20 ENCOUNTER — Other Ambulatory Visit: Payer: Self-pay | Admitting: Cardiovascular Disease

## 2018-11-20 DIAGNOSIS — N183 Chronic kidney disease, stage 3 (moderate): Secondary | ICD-10-CM | POA: Diagnosis not present

## 2018-11-20 DIAGNOSIS — Z79899 Other long term (current) drug therapy: Secondary | ICD-10-CM | POA: Diagnosis not present

## 2018-11-20 DIAGNOSIS — R0602 Shortness of breath: Secondary | ICD-10-CM | POA: Diagnosis not present

## 2018-11-20 DIAGNOSIS — I5032 Chronic diastolic (congestive) heart failure: Secondary | ICD-10-CM | POA: Diagnosis not present

## 2018-11-20 DIAGNOSIS — Z125 Encounter for screening for malignant neoplasm of prostate: Secondary | ICD-10-CM | POA: Diagnosis not present

## 2018-11-20 MED ORDER — FLECAINIDE ACETATE 50 MG PO TABS: 50.0000 mg | ORAL_TABLET | Freq: Two times a day (BID) | ORAL | 1 refills | Status: DC

## 2018-11-20 NOTE — Telephone Encounter (Signed)
Pt calling requesting a 7 day supply refill on Flecainide sent to [facility] on [address], until mail order arrives. Please address

## 2018-11-21 DIAGNOSIS — R82998 Other abnormal findings in urine: Secondary | ICD-10-CM | POA: Diagnosis not present

## 2018-11-26 DIAGNOSIS — R339 Retention of urine, unspecified: Secondary | ICD-10-CM | POA: Diagnosis not present

## 2018-12-05 DIAGNOSIS — H3530 Unspecified macular degeneration: Secondary | ICD-10-CM | POA: Diagnosis not present

## 2018-12-05 DIAGNOSIS — Z23 Encounter for immunization: Secondary | ICD-10-CM | POA: Diagnosis not present

## 2018-12-05 DIAGNOSIS — M1990 Unspecified osteoarthritis, unspecified site: Secondary | ICD-10-CM | POA: Diagnosis not present

## 2018-12-05 DIAGNOSIS — I5032 Chronic diastolic (congestive) heart failure: Secondary | ICD-10-CM | POA: Diagnosis not present

## 2018-12-05 DIAGNOSIS — N183 Chronic kidney disease, stage 3 (moderate): Secondary | ICD-10-CM | POA: Diagnosis not present

## 2018-12-05 DIAGNOSIS — I471 Supraventricular tachycardia: Secondary | ICD-10-CM | POA: Diagnosis not present

## 2018-12-05 DIAGNOSIS — R5381 Other malaise: Secondary | ICD-10-CM | POA: Diagnosis not present

## 2018-12-05 DIAGNOSIS — I38 Endocarditis, valve unspecified: Secondary | ICD-10-CM | POA: Diagnosis not present

## 2018-12-05 DIAGNOSIS — Z0000 Encounter for general adult medical examination without abnormal findings: Secondary | ICD-10-CM | POA: Diagnosis not present

## 2018-12-05 DIAGNOSIS — N401 Enlarged prostate with lower urinary tract symptoms: Secondary | ICD-10-CM | POA: Diagnosis not present

## 2018-12-07 ENCOUNTER — Other Ambulatory Visit: Payer: Self-pay

## 2018-12-07 ENCOUNTER — Ambulatory Visit (INDEPENDENT_AMBULATORY_CARE_PROVIDER_SITE_OTHER): Payer: Medicare HMO | Admitting: Cardiovascular Disease

## 2018-12-07 ENCOUNTER — Encounter: Payer: Self-pay | Admitting: Cardiovascular Disease

## 2018-12-07 DIAGNOSIS — I5032 Chronic diastolic (congestive) heart failure: Secondary | ICD-10-CM

## 2018-12-07 DIAGNOSIS — I340 Nonrheumatic mitral (valve) insufficiency: Secondary | ICD-10-CM

## 2018-12-07 DIAGNOSIS — Z8679 Personal history of other diseases of the circulatory system: Secondary | ICD-10-CM | POA: Diagnosis not present

## 2018-12-07 NOTE — Progress Notes (Signed)
12/07/2018 Travis Kelly   Jul 20, 1922  [id number]  Primary Physician [name], MD Primary Cardiologist: Lorretta Harp MD [name], [place]  HPI:  Travis Kelly is a 85 y.o.  married, father of 1, grandfather of 37 grandchildren was accompanied by his daughterNora.  I last saw him in the office 06/06/2018.  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 He just stopped driving 3 years ago because of macular degeneration degeneration. He lives in [place] [place] with his wife. His cardiologist was Dr. Marc Morgans but he is transitioning to me because of proximity. He has a history of moderate mitral regurgitation, hyperlipidemia and left anterior fascicular block. He wishes no invasive procedures for his mitral valve. [facility] which broke. With Valsalva maneuver. He was placed on when necessary metoprolol. He has had several episodes of PSVT since which have similarly broke with vagal maneuvers. He is fairly active.   He was [facility] 04/16/2018 with PSVT.  He was seen in consultation the following day by Dr. Rayann Heman who put him on flecainide 50 mg p.o. twice daily which has resulted in marked improvement in his PSVT episodes.  He has had no no recurrent episodes since I saw him in February.  Unfortunately, his wife of 11 years died in 07-12-22 of this year.  He lives alone and habits would.  He walks with the aid of a walker.  He is otherwise minimally symptomatic.  He denies chest pain or shortness of breath.  He does have some mild peripheral edema.  Current Meds  Medication Sig  . acetaminophen (TYLENOL) 500 MG tablet Take 1,000 mg by mouth daily as needed for mild pain.  . Cholecalciferol (VITAMIN D) 2000 units CAPS Take 2,000 Units by mouth daily.  . Cyanocobalamin (VITAMIN B 12 PO) Take 5,000 mg by mouth daily.   . finasteride (PROSCAR) 5 MG tablet  Take 5 mg by mouth daily.  . flecainide (TAMBOCOR) 50 MG tablet Take 1 tablet (50 mg total) by mouth every 12 (twelve) hours.  . fluticasone (FLONASE) 50 MCG/ACT nasal spray Place 1-2 sprays into both nostrils daily as needed for allergies or rhinitis.  . furosemide (LASIX) 20 MG tablet TAKE 1 TABLET EVERY DAY (Patient taking differently: Take 20 mg by mouth daily. )  . GLUCOSAMINE HCL PO Take 1 tablet by mouth daily.   . hydroxypropyl methylcellulose / hypromellose (ISOPTO TEARS / GONIOVISC) 2.5 % ophthalmic solution Place 1 drop into both eyes 2 (two) times daily.  . Lutein 20 MG CAPS Take 1 capsule by mouth daily.   . meloxicam (MOBIC) 15 MG tablet Take 15 mg by mouth daily as needed for pain.  . metoprolol tartrate (LOPRESSOR) 12.5 mg TABS tablet Take 12.5 mg by mouth 2 (two) times daily.  . Misc Natural Products (TURMERIC CURCUMIN) CAPS Take 1 capsule by mouth daily.  . Multiple Minerals-Vitamins (CALCIUM-MAGNESIUM-ZINC-D3 PO) Take 1 tablet by mouth daily.  . Multiple Vitamin (MULTIVITAMIN) tablet Take 1 tablet by mouth daily.  . Multiple Vitamins-Minerals (OCUVITE EYE HEALTH FORMULA) CAPS Take 1 tablet by mouth daily.  . naproxen (NAPROSYN) 375 MG tablet Take 375 mg by mouth daily as needed for mild pain.  Marland Kitchen OVER THE COUNTER MEDICATION 1 Container by Mouth Rinse route 2 (two) times daily. [employer] Peroxyl  . RA KRILL OIL 500 MG CAPS Take 500 mg by mouth daily.  . vitamin  C (ASCORBIC ACID) 500 MG tablet Take 500 mg by mouth daily.     Allergies  Allergen Reactions  . Rocephin [Ceftriaxone Sodium In Dextrose]     Vomiting, hallucinations  . Barbital Rash and Other (See Comments)    Blue lips and swollen tongue    Social History   Socioeconomic History  . Marital status: Married    Spouse name: Not on file  . Number of children: Not on file  . Years of education: Not on file  . Highest education level: Not on file  Occupational History  . Not on file  Social Needs  . Financial  resource strain: Not on file  . Food insecurity    Worry: Not on file    Inability: Not on file  . Transportation needs    Medical: Not on file    Non-medical: Not on file  Tobacco Use  . Smoking status: Former Research scientist (life sciences)  . Smokeless tobacco: Never Used  . Tobacco comment: 04/17/2018 "quit smoking when I was 14"  Substance and Sexual Activity  . Alcohol use: Yes    Alcohol/week: 3.0 standard drinks    Types: 3 Glasses of wine per week  . Drug use: Never  . Sexual activity: Not on file  Lifestyle  . Physical activity    Days per week: Not on file    Minutes per session: Not on file  . Stress: Not on file  Relationships  . Social Herbalist on phone: Not on file    Gets together: Not on file    Attends religious service: Not on file    Active member of club or organization: Not on file    Attends meetings of clubs or organizations: Not on file    Relationship status: Not on file  . Intimate partner violence    Fear of current or ex partner: Not on file    Emotionally abused: Not on file    Physically abused: Not on file    Forced sexual activity: Not on file  Other Topics Concern  . Not on file  Social History Narrative  . Not on file     Review of Systems: General: negative for chills, fever, night sweats or weight changes.  Cardiovascular: negative for chest pain, dyspnea on exertion, edema, orthopnea, palpitations, paroxysmal nocturnal dyspnea or shortness of breath Dermatological: negative for rash Respiratory: negative for cough or wheezing Urologic: negative for hematuria Abdominal: negative for nausea, vomiting, diarrhea, bright red blood per rectum, melena, or hematemesis Neurologic: negative for visual changes, syncope, or dizziness All other systems reviewed and are otherwise negative except as noted above.    Blood pressure 110/76, pulse 76, temperature 97.7 F (36.5 C), height 5\' 7"  (1.702 m), weight 167 lb (75.8 kg).  General appearance: alert  and no distress Neck: no adenopathy, no carotid bruit, no JVD, supple, symmetrical, trachea midline and thyroid not enlarged, symmetric, no tenderness/mass/nodules Lungs: clear to auscultation bilaterally Heart: Soft outflow tract murmur Extremities: 1+ peripheral edema Pulses: 2+ and symmetric Skin: Skin color, texture, turgor normal. No rashes or lesions Neurologic: Alert and oriented X 3, normal strength and tone. Normal symmetric reflexes. Normal coordination and gait  EKG not performed today  ASSESSMENT AND PLAN:   History of PSVT  She has PSVT maintaining sinus rhythm on flecainide and metoprolol.  He has not had any recurrent episode since I saw him last in February  Moderate mitral regurgitation Moderate mitral regurgitation and moderate pulmonary hypertension  on 2D echo performed 04/16/2018.  Chronic diastolic CHF (congestive heart failure) (HCC) Chronic diastolic heart failure not on diuretics with minimal peripheral edema.      Lorretta Harp MD FACP,FACC,FAHA, [facility] 12/07/2018 2:47 PM

## 2018-12-07 NOTE — Patient Instructions (Signed)
Medication Instructions:  Your physician recommends that you continue on your current medications as directed. Please refer to the Current Medication list given to you today.  If you need a refill on your cardiac medications before your next appointment, please call your pharmacy.   Lab work: NONE If you have labs (blood work) drawn today and your tests are completely normal, you will receive your results only by: . MyChart Message (if you have MyChart) OR . A paper copy in the mail If you have any lab test that is abnormal or we need to change your treatment, we will call you to review the results.  Testing/Procedures: NONE  Follow-Up: At [facility], you and your health needs are our priority.  As part of our continuing mission to provide you with exceptional heart care, we have created designated Provider Care Teams.  These Care Teams include your primary Cardiologist (physician) and Advanced Practice Providers (APPs -  Physician Assistants and Nurse Practitioners) who all work together to provide you with the care you need, when you need it. You will need a follow up appointment in 6 months with LUKE KILROY, PA-C AND IN 12 MONTHS WITH Dr. Jonathan Berry.  Please call our office 2 months in advance to schedule each appointment.     

## 2018-12-07 NOTE — Assessment & Plan Note (Signed)
She has PSVT maintaining sinus rhythm on flecainide and metoprolol.  He has not had any recurrent episode since I saw him last in February

## 2018-12-07 NOTE — Assessment & Plan Note (Signed)
Chronic diastolic heart failure not on diuretics with minimal peripheral edema.

## 2018-12-07 NOTE — Assessment & Plan Note (Signed)
Moderate mitral regurgitation and moderate pulmonary hypertension on 2D echo performed 04/16/2018.

## 2018-12-31 DIAGNOSIS — R339 Retention of urine, unspecified: Secondary | ICD-10-CM | POA: Diagnosis not present

## 2019-01-12 ENCOUNTER — Other Ambulatory Visit: Payer: Self-pay | Admitting: Cardiology

## 2019-01-30 ENCOUNTER — Other Ambulatory Visit: Payer: Self-pay

## 2019-01-30 ENCOUNTER — Telehealth (INDEPENDENT_AMBULATORY_CARE_PROVIDER_SITE_OTHER): Payer: Medicare HMO | Admitting: Internal Medicine

## 2019-01-30 VITALS — BP 120/70 | HR 81 | Wt 168.0 lb

## 2019-01-30 DIAGNOSIS — I471 Supraventricular tachycardia, unspecified: Secondary | ICD-10-CM

## 2019-01-30 DIAGNOSIS — I440 Atrioventricular block, first degree: Secondary | ICD-10-CM

## 2019-01-30 DIAGNOSIS — I447 Left bundle-branch block, unspecified: Secondary | ICD-10-CM

## 2019-01-30 DIAGNOSIS — I5032 Chronic diastolic (congestive) heart failure: Secondary | ICD-10-CM

## 2019-01-30 DIAGNOSIS — I340 Nonrheumatic mitral (valve) insufficiency: Secondary | ICD-10-CM

## 2019-01-30 DIAGNOSIS — R339 Retention of urine, unspecified: Secondary | ICD-10-CM | POA: Diagnosis not present

## 2019-01-30 NOTE — Progress Notes (Signed)
Electrophysiology TeleHealth Note  Due to national recommendations of social distancing due to [place] 19, an audio telehealth visit is felt to be most appropriate for this patient at this time.  Verbal consent was obtained by me for the telehealth visit today.  The patient does not have capability for a virtual visit.  A phone visit is therefore required today.   Date:  01/30/2019   ID:  Travis Kelly, DOB [birth year], MRN [id number]  Location: patient's home  Provider location:  [facility]  Evaluation Performed: Follow-up visit  PCP:  [name], MD   Electrophysiologist:  Dr Rayann Heman  Chief Complaint:  SVT follow up  History of Present Illness:    Travis Kelly is a 85 y.o. male who presents via telehealth conferencing today.  Since last being seen in our clinic, the patient reports doing very well.  He remains active at home. He does have some shortness of breath with exertion (at baseline). He has had some "floating sensations" - he does not have dizziness, pre syncope or syncope with these. Today, he denies symptoms of palpitations, chest pain, lower extremity edema, dizziness, presyncope, or syncope.  The patient is otherwise without complaint today.  The patient denies symptoms of fevers, chills, cough, or new SOB worrisome for COVID 19.  Past Medical History:  Diagnosis Date  . Age-related macular degeneration, dry, left eye   . Age-related macular degeneration, wet, right eye ([place])   . Arthritis    "right knee" (04/17/2018)  . CHF (congestive heart failure) ([place])   . GERD (gastroesophageal reflux disease)   . Heart murmur   . History of duodenal ulcer   . History of hiatal hernia   . LAFB (left anterior fascicular block)   . Mitral valve disorder    Moderate MR  . Nephrolithiasis 2013  . Pneumonia 1938; ?date   "once as a child; once as an adult" (04/17/2018)  . PSVT (paroxysmal supraventricular tachycardia) ([place]) 06/2016    Past Surgical History:  Procedure  Laterality Date  . CARDIAC CATHETERIZATION    . CATARACT EXTRACTION W/ INTRAOCULAR LENS  IMPLANT, BILATERAL Bilateral 2013  . SKIN BIOPSY Left    "thigh; suspected CA; it was not"    Current Outpatient Medications  Medication Sig Dispense Refill  . acetaminophen (TYLENOL) 500 MG tablet Take 1,000 mg by mouth daily as needed for mild pain.    . Cholecalciferol (VITAMIN D) 2000 units CAPS Take 2,000 Units by mouth daily.    . Cyanocobalamin (VITAMIN B 12 PO) Take 5,000 mg by mouth daily.     . finasteride (PROSCAR) 5 MG tablet Take 5 mg by mouth daily.    . flecainide (TAMBOCOR) 50 MG tablet Take 1 tablet (50 mg total) by mouth every 12 (twelve) hours. 14 tablet 1  . fluticasone (FLONASE) 50 MCG/ACT nasal spray Place 1-2 sprays into both nostrils daily as needed for allergies or rhinitis.    . furosemide (LASIX) 20 MG tablet TAKE 1 TABLET EVERY DAY (Patient taking differently: Take 20 mg by mouth daily. ) 90 tablet 2  . GLUCOSAMINE HCL PO Take 1 tablet by mouth daily.     . hydroxypropyl methylcellulose / hypromellose (ISOPTO TEARS / GONIOVISC) 2.5 % ophthalmic solution Place 1 drop into both eyes 2 (two) times daily.    . Lutein 20 MG CAPS Take 1 capsule by mouth daily.     . meloxicam (MOBIC) 15 MG tablet Take 15 mg by mouth daily as  needed for pain.    . metoprolol tartrate (LOPRESSOR) 25 MG tablet TAKE 1 TABLET BY MOUTH THREE TIMES A DAY 270 tablet 1  . Misc Natural Products (TURMERIC CURCUMIN) CAPS Take 1 capsule by mouth daily.    . Multiple Minerals-Vitamins (CALCIUM-MAGNESIUM-ZINC-D3 PO) Take 1 tablet by mouth daily.    . Multiple Vitamin (MULTIVITAMIN) tablet Take 1 tablet by mouth daily.    . Multiple Vitamins-Minerals (OCUVITE EYE HEALTH FORMULA) CAPS Take 1 tablet by mouth daily.    . naproxen (NAPROSYN) 375 MG tablet Take 375 mg by mouth daily as needed for mild pain.    Marland Kitchen OVER THE COUNTER MEDICATION 1 Container by Mouth Rinse route 2 (two) times daily. [employer] Peroxyl    . RA  KRILL OIL 500 MG CAPS Take 500 mg by mouth daily.    . vitamin C (ASCORBIC ACID) 500 MG tablet Take 500 mg by mouth daily.     No current facility-administered medications for this visit.     Allergies:   Rocephin [ceftriaxone sodium in dextrose] and Barbital   Social History:  The patient  reports that he has quit smoking. He has never used smokeless tobacco. He reports current alcohol use of about 3.0 standard drinks of alcohol per week. He reports that he does not use drugs.   Family History:  The patient's family history includes Cancer in his mother; Pneumonia in his father.   ROS:  Please see the history of present illness.   All other systems are personally reviewed and negative.    Exam:    Vital Signs:  BP 120/70   Pulse 81   Wt 168 lb (76.2 kg)   BMI 26.31 kg/m   Well sounding and appearing, alert and conversant, regular work of breathing   Labs/Other Tests and Data Reviewed:    Recent Labs: 04/16/2018: ALT 72; B Natriuretic Peptide 1,745.6 04/17/2018: TSH 3.997 04/20/2018: Hemoglobin 11.7; Magnesium 2.0; Platelets 244 10/03/2018: NT-Pro BNP 4,125 10/30/2018: BUN 32; Creatinine, Ser 1.76; Potassium 4.8; Sodium 139   Wt Readings from Last 3 Encounters:  01/30/19 168 lb (76.2 kg)  12/07/18 167 lb (75.8 kg)  11/05/18 164 lb (74.4 kg)        ASSESSMENT & PLAN:    1.  SVT Doing well on Flecainide No changes today  2.  First degree AV block/LBBB Stable by EKG In July Would avoid increasing Flecainide  3.  Chronic diastolic dysfunction Stable No change required today  4.  Moderate MR, mild AS Followed by Dr Gwenlyn Found  5.  Floating sensations Unclear cause No dizziness, syncope, or pre-syncope These are rare Advised follow up with PCP   Follow-up:  Me in 9 months    Patient Risk:  after full review of this patients clinical status, I feel that they are at moderate risk at this time.  Today, I have spent 15 minutes with the patient with telehealth  technology discussing arrhythmia management .    Army Fossa, MD  01/30/2019 11:51 AM     [facility] [address] [place] [address] [phone] (office) [phone] (fax)

## 2019-02-18 ENCOUNTER — Encounter (HOSPITAL_BASED_OUTPATIENT_CLINIC_OR_DEPARTMENT_OTHER): Payer: Self-pay | Admitting: Emergency Medicine

## 2019-02-18 ENCOUNTER — Emergency Department (HOSPITAL_BASED_OUTPATIENT_CLINIC_OR_DEPARTMENT_OTHER)
Admission: EM | Admit: 2019-02-18 | Discharge: 2019-02-18 | Disposition: A | Discharge status: Home / Self Care | Payer: No Typology Code available for payment source | Attending: Emergency Medicine | Admitting: Emergency Medicine

## 2019-02-18 ENCOUNTER — Other Ambulatory Visit: Payer: Self-pay

## 2019-02-18 ENCOUNTER — Emergency Department (HOSPITAL_BASED_OUTPATIENT_CLINIC_OR_DEPARTMENT_OTHER): Payer: No Typology Code available for payment source

## 2019-02-18 DIAGNOSIS — Y999 Unspecified external cause status: Secondary | ICD-10-CM | POA: Diagnosis not present

## 2019-02-18 DIAGNOSIS — I5032 Chronic diastolic (congestive) heart failure: Secondary | ICD-10-CM | POA: Insufficient documentation

## 2019-02-18 DIAGNOSIS — W19XXXA Unspecified fall, initial encounter: Secondary | ICD-10-CM

## 2019-02-18 DIAGNOSIS — Z888 Allergy status to other drugs, medicaments and biological substances status: Secondary | ICD-10-CM | POA: Insufficient documentation

## 2019-02-18 DIAGNOSIS — R52 Pain, unspecified: Secondary | ICD-10-CM | POA: Diagnosis not present

## 2019-02-18 DIAGNOSIS — R011 Cardiac murmur, unspecified: Secondary | ICD-10-CM | POA: Insufficient documentation

## 2019-02-18 DIAGNOSIS — Z23 Encounter for immunization: Secondary | ICD-10-CM | POA: Insufficient documentation

## 2019-02-18 DIAGNOSIS — W1839XA Other fall on same level, initial encounter: Secondary | ICD-10-CM | POA: Insufficient documentation

## 2019-02-18 DIAGNOSIS — Y92003 Bedroom of unspecified non-institutional (private) residence as the place of occurrence of the external cause: Secondary | ICD-10-CM | POA: Diagnosis not present

## 2019-02-18 DIAGNOSIS — N183 Chronic kidney disease, stage 3 unspecified: Secondary | ICD-10-CM | POA: Insufficient documentation

## 2019-02-18 DIAGNOSIS — Z79899 Other long term (current) drug therapy: Secondary | ICD-10-CM | POA: Insufficient documentation

## 2019-02-18 DIAGNOSIS — S0990XA Unspecified injury of head, initial encounter: Secondary | ICD-10-CM | POA: Insufficient documentation

## 2019-02-18 DIAGNOSIS — Z881 Allergy status to other antibiotic agents status: Secondary | ICD-10-CM | POA: Diagnosis not present

## 2019-02-18 DIAGNOSIS — Y9389 Activity, other specified: Secondary | ICD-10-CM | POA: Insufficient documentation

## 2019-02-18 DIAGNOSIS — S0083XA Contusion of other part of head, initial encounter: Secondary | ICD-10-CM | POA: Insufficient documentation

## 2019-02-18 MED ORDER — TETANUS-DIPHTH-ACELL PERTUSSIS 5-2.5-18.5 LF-MCG/0.5 IM SUSP
0.5000 mL | Freq: Once | INTRAMUSCULAR | Status: AC
  Administered 2019-02-18: 08:00:00 0.5 mL via INTRAMUSCULAR
  Filled 2019-02-18: qty 0.5
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
## 2019-02-18 NOTE — Discharge Instructions (Addendum)
You were seen today after a fall.  Your CT scans are reassuring.  You do have a hematoma to the forehead.  Apply ice as needed.
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
## 2019-02-25 DIAGNOSIS — M6281 Muscle weakness (generalized): Secondary | ICD-10-CM | POA: Diagnosis not present

## 2019-02-25 DIAGNOSIS — R2681 Unsteadiness on feet: Secondary | ICD-10-CM | POA: Diagnosis not present

## 2019-02-25 DIAGNOSIS — M170 Bilateral primary osteoarthritis of knee: Secondary | ICD-10-CM | POA: Diagnosis not present

## 2019-02-25 DIAGNOSIS — R262 Difficulty in walking, not elsewhere classified: Secondary | ICD-10-CM | POA: Diagnosis not present

## 2019-03-01 DIAGNOSIS — M6281 Muscle weakness (generalized): Secondary | ICD-10-CM | POA: Diagnosis not present

## 2019-03-01 DIAGNOSIS — R2681 Unsteadiness on feet: Secondary | ICD-10-CM | POA: Diagnosis not present

## 2019-03-01 DIAGNOSIS — M170 Bilateral primary osteoarthritis of knee: Secondary | ICD-10-CM | POA: Diagnosis not present

## 2019-03-01 DIAGNOSIS — R339 Retention of urine, unspecified: Secondary | ICD-10-CM | POA: Diagnosis not present

## 2019-03-01 DIAGNOSIS — R262 Difficulty in walking, not elsewhere classified: Secondary | ICD-10-CM | POA: Diagnosis not present

## 2019-03-04 DIAGNOSIS — M170 Bilateral primary osteoarthritis of knee: Secondary | ICD-10-CM | POA: Diagnosis not present

## 2019-03-04 DIAGNOSIS — R262 Difficulty in walking, not elsewhere classified: Secondary | ICD-10-CM | POA: Diagnosis not present

## 2019-03-04 DIAGNOSIS — R2681 Unsteadiness on feet: Secondary | ICD-10-CM | POA: Diagnosis not present

## 2019-03-04 DIAGNOSIS — M6281 Muscle weakness (generalized): Secondary | ICD-10-CM | POA: Diagnosis not present

## 2019-03-06 DIAGNOSIS — M6281 Muscle weakness (generalized): Secondary | ICD-10-CM | POA: Diagnosis not present

## 2019-03-06 DIAGNOSIS — R2681 Unsteadiness on feet: Secondary | ICD-10-CM | POA: Diagnosis not present

## 2019-03-06 DIAGNOSIS — M170 Bilateral primary osteoarthritis of knee: Secondary | ICD-10-CM | POA: Diagnosis not present

## 2019-03-06 DIAGNOSIS — R262 Difficulty in walking, not elsewhere classified: Secondary | ICD-10-CM | POA: Diagnosis not present

## 2019-03-11 DIAGNOSIS — M6281 Muscle weakness (generalized): Secondary | ICD-10-CM | POA: Diagnosis not present

## 2019-03-11 DIAGNOSIS — R262 Difficulty in walking, not elsewhere classified: Secondary | ICD-10-CM | POA: Diagnosis not present

## 2019-03-11 DIAGNOSIS — R2681 Unsteadiness on feet: Secondary | ICD-10-CM | POA: Diagnosis not present

## 2019-03-11 DIAGNOSIS — M170 Bilateral primary osteoarthritis of knee: Secondary | ICD-10-CM | POA: Diagnosis not present

## 2019-03-13 DIAGNOSIS — M6281 Muscle weakness (generalized): Secondary | ICD-10-CM | POA: Diagnosis not present

## 2019-03-13 DIAGNOSIS — M170 Bilateral primary osteoarthritis of knee: Secondary | ICD-10-CM | POA: Diagnosis not present

## 2019-03-13 DIAGNOSIS — R262 Difficulty in walking, not elsewhere classified: Secondary | ICD-10-CM | POA: Diagnosis not present

## 2019-03-13 DIAGNOSIS — R2681 Unsteadiness on feet: Secondary | ICD-10-CM | POA: Diagnosis not present

## 2019-03-18 DIAGNOSIS — M6281 Muscle weakness (generalized): Secondary | ICD-10-CM | POA: Diagnosis not present

## 2019-03-18 DIAGNOSIS — R262 Difficulty in walking, not elsewhere classified: Secondary | ICD-10-CM | POA: Diagnosis not present

## 2019-03-18 DIAGNOSIS — M170 Bilateral primary osteoarthritis of knee: Secondary | ICD-10-CM | POA: Diagnosis not present

## 2019-03-18 DIAGNOSIS — R2681 Unsteadiness on feet: Secondary | ICD-10-CM | POA: Diagnosis not present

## 2019-03-20 DIAGNOSIS — M6281 Muscle weakness (generalized): Secondary | ICD-10-CM | POA: Diagnosis not present

## 2019-03-20 DIAGNOSIS — R262 Difficulty in walking, not elsewhere classified: Secondary | ICD-10-CM | POA: Diagnosis not present

## 2019-03-20 DIAGNOSIS — R2681 Unsteadiness on feet: Secondary | ICD-10-CM | POA: Diagnosis not present

## 2019-03-20 DIAGNOSIS — M170 Bilateral primary osteoarthritis of knee: Secondary | ICD-10-CM | POA: Diagnosis not present

## 2019-03-25 DIAGNOSIS — M6281 Muscle weakness (generalized): Secondary | ICD-10-CM | POA: Diagnosis not present

## 2019-03-25 DIAGNOSIS — M170 Bilateral primary osteoarthritis of knee: Secondary | ICD-10-CM | POA: Diagnosis not present

## 2019-03-25 DIAGNOSIS — R2681 Unsteadiness on feet: Secondary | ICD-10-CM | POA: Diagnosis not present

## 2019-03-25 DIAGNOSIS — R262 Difficulty in walking, not elsewhere classified: Secondary | ICD-10-CM | POA: Diagnosis not present

## 2019-03-27 DIAGNOSIS — R262 Difficulty in walking, not elsewhere classified: Secondary | ICD-10-CM | POA: Diagnosis not present

## 2019-03-27 DIAGNOSIS — M6281 Muscle weakness (generalized): Secondary | ICD-10-CM | POA: Diagnosis not present

## 2019-03-27 DIAGNOSIS — M170 Bilateral primary osteoarthritis of knee: Secondary | ICD-10-CM | POA: Diagnosis not present

## 2019-03-27 DIAGNOSIS — R2681 Unsteadiness on feet: Secondary | ICD-10-CM | POA: Diagnosis not present

## 2019-04-01 DIAGNOSIS — R2681 Unsteadiness on feet: Secondary | ICD-10-CM | POA: Diagnosis not present

## 2019-04-01 DIAGNOSIS — M6281 Muscle weakness (generalized): Secondary | ICD-10-CM | POA: Diagnosis not present

## 2019-04-01 DIAGNOSIS — M170 Bilateral primary osteoarthritis of knee: Secondary | ICD-10-CM | POA: Diagnosis not present

## 2019-04-01 DIAGNOSIS — R262 Difficulty in walking, not elsewhere classified: Secondary | ICD-10-CM | POA: Diagnosis not present

## 2019-04-02 DIAGNOSIS — R339 Retention of urine, unspecified: Secondary | ICD-10-CM | POA: Diagnosis not present

## 2019-04-03 DIAGNOSIS — R262 Difficulty in walking, not elsewhere classified: Secondary | ICD-10-CM | POA: Diagnosis not present

## 2019-04-03 DIAGNOSIS — M6281 Muscle weakness (generalized): Secondary | ICD-10-CM | POA: Diagnosis not present

## 2019-04-03 DIAGNOSIS — R2681 Unsteadiness on feet: Secondary | ICD-10-CM | POA: Diagnosis not present

## 2019-04-03 DIAGNOSIS — M170 Bilateral primary osteoarthritis of knee: Secondary | ICD-10-CM | POA: Diagnosis not present

## 2019-04-08 DIAGNOSIS — M170 Bilateral primary osteoarthritis of knee: Secondary | ICD-10-CM | POA: Diagnosis not present

## 2019-04-08 DIAGNOSIS — M6281 Muscle weakness (generalized): Secondary | ICD-10-CM | POA: Diagnosis not present

## 2019-04-08 DIAGNOSIS — R262 Difficulty in walking, not elsewhere classified: Secondary | ICD-10-CM | POA: Diagnosis not present

## 2019-04-08 DIAGNOSIS — R2681 Unsteadiness on feet: Secondary | ICD-10-CM | POA: Diagnosis not present

## 2019-04-10 DIAGNOSIS — M6281 Muscle weakness (generalized): Secondary | ICD-10-CM | POA: Diagnosis not present

## 2019-04-10 DIAGNOSIS — M170 Bilateral primary osteoarthritis of knee: Secondary | ICD-10-CM | POA: Diagnosis not present

## 2019-04-10 DIAGNOSIS — R262 Difficulty in walking, not elsewhere classified: Secondary | ICD-10-CM | POA: Diagnosis not present

## 2019-04-10 DIAGNOSIS — R2681 Unsteadiness on feet: Secondary | ICD-10-CM | POA: Diagnosis not present

## 2019-04-15 DIAGNOSIS — R2681 Unsteadiness on feet: Secondary | ICD-10-CM | POA: Diagnosis not present

## 2019-04-15 DIAGNOSIS — Z1159 Encounter for screening for other viral diseases: Secondary | ICD-10-CM | POA: Diagnosis not present

## 2019-04-15 DIAGNOSIS — Z20828 Contact with and (suspected) exposure to other viral communicable diseases: Secondary | ICD-10-CM | POA: Diagnosis not present

## 2019-04-15 DIAGNOSIS — M170 Bilateral primary osteoarthritis of knee: Secondary | ICD-10-CM | POA: Diagnosis not present

## 2019-04-15 DIAGNOSIS — M6281 Muscle weakness (generalized): Secondary | ICD-10-CM | POA: Diagnosis not present

## 2019-04-15 DIAGNOSIS — R262 Difficulty in walking, not elsewhere classified: Secondary | ICD-10-CM | POA: Diagnosis not present

## 2019-04-17 DIAGNOSIS — M6281 Muscle weakness (generalized): Secondary | ICD-10-CM | POA: Diagnosis not present

## 2019-04-17 DIAGNOSIS — R2681 Unsteadiness on feet: Secondary | ICD-10-CM | POA: Diagnosis not present

## 2019-04-17 DIAGNOSIS — R262 Difficulty in walking, not elsewhere classified: Secondary | ICD-10-CM | POA: Diagnosis not present

## 2019-04-17 DIAGNOSIS — M170 Bilateral primary osteoarthritis of knee: Secondary | ICD-10-CM | POA: Diagnosis not present

## 2019-04-18 DIAGNOSIS — Z20828 Contact with and (suspected) exposure to other viral communicable diseases: Secondary | ICD-10-CM | POA: Diagnosis not present

## 2019-04-18 DIAGNOSIS — Z1159 Encounter for screening for other viral diseases: Secondary | ICD-10-CM | POA: Diagnosis not present

## 2019-04-22 DIAGNOSIS — Z1159 Encounter for screening for other viral diseases: Secondary | ICD-10-CM | POA: Diagnosis not present

## 2019-04-22 DIAGNOSIS — Z20828 Contact with and (suspected) exposure to other viral communicable diseases: Secondary | ICD-10-CM | POA: Diagnosis not present

## 2019-04-25 DIAGNOSIS — Z1159 Encounter for screening for other viral diseases: Secondary | ICD-10-CM | POA: Diagnosis not present

## 2019-04-25 DIAGNOSIS — Z20828 Contact with and (suspected) exposure to other viral communicable diseases: Secondary | ICD-10-CM | POA: Diagnosis not present

## 2019-04-29 DIAGNOSIS — Z20828 Contact with and (suspected) exposure to other viral communicable diseases: Secondary | ICD-10-CM | POA: Diagnosis not present

## 2019-04-29 DIAGNOSIS — Z1159 Encounter for screening for other viral diseases: Secondary | ICD-10-CM | POA: Diagnosis not present

## 2019-05-03 ENCOUNTER — Telehealth: Payer: Self-pay | Admitting: Nurse Practitioner

## 2019-05-03 DIAGNOSIS — R339 Retention of urine, unspecified: Secondary | ICD-10-CM | POA: Diagnosis not present

## 2019-05-03 NOTE — Telephone Encounter (Signed)
Called to Discuss with patient about Covid symptoms and the use of bamlanivimab, a monoclonal antibody infusion for those with mild to moderate Covid symptoms and at a high risk of hospitalization.     Pt tested positive on 04/25/19 - will place on list for cancellation tomorrow

## 2019-05-06 DIAGNOSIS — Z1159 Encounter for screening for other viral diseases: Secondary | ICD-10-CM | POA: Diagnosis not present

## 2019-05-06 DIAGNOSIS — Z20828 Contact with and (suspected) exposure to other viral communicable diseases: Secondary | ICD-10-CM | POA: Diagnosis not present

## 2019-05-09 DIAGNOSIS — Z1159 Encounter for screening for other viral diseases: Secondary | ICD-10-CM | POA: Diagnosis not present

## 2019-05-09 DIAGNOSIS — Z20828 Contact with and (suspected) exposure to other viral communicable diseases: Secondary | ICD-10-CM | POA: Diagnosis not present

## 2019-05-15 DIAGNOSIS — R2681 Unsteadiness on feet: Secondary | ICD-10-CM | POA: Diagnosis not present

## 2019-05-15 DIAGNOSIS — Z8616 Personal history of COVID-19: Secondary | ICD-10-CM | POA: Diagnosis not present

## 2019-05-15 DIAGNOSIS — M170 Bilateral primary osteoarthritis of knee: Secondary | ICD-10-CM | POA: Diagnosis not present

## 2019-05-15 DIAGNOSIS — M6281 Muscle weakness (generalized): Secondary | ICD-10-CM | POA: Diagnosis not present

## 2019-05-15 DIAGNOSIS — R062 Wheezing: Secondary | ICD-10-CM | POA: Diagnosis not present

## 2019-05-15 DIAGNOSIS — R262 Difficulty in walking, not elsewhere classified: Secondary | ICD-10-CM | POA: Diagnosis not present

## 2019-05-16 DIAGNOSIS — R062 Wheezing: Secondary | ICD-10-CM | POA: Diagnosis not present

## 2019-05-17 DIAGNOSIS — R262 Difficulty in walking, not elsewhere classified: Secondary | ICD-10-CM | POA: Diagnosis not present

## 2019-05-17 DIAGNOSIS — M170 Bilateral primary osteoarthritis of knee: Secondary | ICD-10-CM | POA: Diagnosis not present

## 2019-05-17 DIAGNOSIS — R2681 Unsteadiness on feet: Secondary | ICD-10-CM | POA: Diagnosis not present

## 2019-05-17 DIAGNOSIS — M6281 Muscle weakness (generalized): Secondary | ICD-10-CM | POA: Diagnosis not present

## 2019-05-20 DIAGNOSIS — R262 Difficulty in walking, not elsewhere classified: Secondary | ICD-10-CM | POA: Diagnosis not present

## 2019-05-20 DIAGNOSIS — R2681 Unsteadiness on feet: Secondary | ICD-10-CM | POA: Diagnosis not present

## 2019-05-20 DIAGNOSIS — M6281 Muscle weakness (generalized): Secondary | ICD-10-CM | POA: Diagnosis not present

## 2019-05-20 DIAGNOSIS — M170 Bilateral primary osteoarthritis of knee: Secondary | ICD-10-CM | POA: Diagnosis not present

## 2019-05-21 DIAGNOSIS — N138 Other obstructive and reflux uropathy: Secondary | ICD-10-CM | POA: Diagnosis not present

## 2019-05-21 DIAGNOSIS — N200 Calculus of kidney: Secondary | ICD-10-CM | POA: Diagnosis not present

## 2019-05-21 DIAGNOSIS — N401 Enlarged prostate with lower urinary tract symptoms: Secondary | ICD-10-CM | POA: Diagnosis not present

## 2019-05-22 DIAGNOSIS — M6281 Muscle weakness (generalized): Secondary | ICD-10-CM | POA: Diagnosis not present

## 2019-05-22 DIAGNOSIS — M170 Bilateral primary osteoarthritis of knee: Secondary | ICD-10-CM | POA: Diagnosis not present

## 2019-05-22 DIAGNOSIS — R2681 Unsteadiness on feet: Secondary | ICD-10-CM | POA: Diagnosis not present

## 2019-05-22 DIAGNOSIS — R262 Difficulty in walking, not elsewhere classified: Secondary | ICD-10-CM | POA: Diagnosis not present

## 2019-05-27 DIAGNOSIS — R2681 Unsteadiness on feet: Secondary | ICD-10-CM | POA: Diagnosis not present

## 2019-05-27 DIAGNOSIS — R262 Difficulty in walking, not elsewhere classified: Secondary | ICD-10-CM | POA: Diagnosis not present

## 2019-05-27 DIAGNOSIS — M170 Bilateral primary osteoarthritis of knee: Secondary | ICD-10-CM | POA: Diagnosis not present

## 2019-05-27 DIAGNOSIS — M6281 Muscle weakness (generalized): Secondary | ICD-10-CM | POA: Diagnosis not present

## 2019-05-29 ENCOUNTER — Other Ambulatory Visit: Payer: Self-pay | Admitting: Internal Medicine

## 2019-05-29 DIAGNOSIS — R2681 Unsteadiness on feet: Secondary | ICD-10-CM | POA: Diagnosis not present

## 2019-05-29 DIAGNOSIS — M170 Bilateral primary osteoarthritis of knee: Secondary | ICD-10-CM | POA: Diagnosis not present

## 2019-05-29 DIAGNOSIS — R918 Other nonspecific abnormal finding of lung field: Secondary | ICD-10-CM

## 2019-05-29 DIAGNOSIS — M6281 Muscle weakness (generalized): Secondary | ICD-10-CM | POA: Diagnosis not present

## 2019-05-29 DIAGNOSIS — R262 Difficulty in walking, not elsewhere classified: Secondary | ICD-10-CM | POA: Diagnosis not present

## 2019-05-31 DIAGNOSIS — H353122 Nonexudative age-related macular degeneration, left eye, intermediate dry stage: Secondary | ICD-10-CM | POA: Diagnosis not present

## 2019-05-31 DIAGNOSIS — H0100B Unspecified blepharitis left eye, upper and lower eyelids: Secondary | ICD-10-CM | POA: Diagnosis not present

## 2019-05-31 DIAGNOSIS — H353212 Exudative age-related macular degeneration, right eye, with inactive choroidal neovascularization: Secondary | ICD-10-CM | POA: Diagnosis not present

## 2019-05-31 DIAGNOSIS — H0100A Unspecified blepharitis right eye, upper and lower eyelids: Secondary | ICD-10-CM | POA: Diagnosis not present

## 2019-05-31 DIAGNOSIS — Z961 Presence of intraocular lens: Secondary | ICD-10-CM | POA: Diagnosis not present

## 2019-05-31 DIAGNOSIS — H43812 Vitreous degeneration, left eye: Secondary | ICD-10-CM | POA: Diagnosis not present

## 2019-06-03 DIAGNOSIS — R2681 Unsteadiness on feet: Secondary | ICD-10-CM | POA: Diagnosis not present

## 2019-06-03 DIAGNOSIS — M170 Bilateral primary osteoarthritis of knee: Secondary | ICD-10-CM | POA: Diagnosis not present

## 2019-06-03 DIAGNOSIS — R262 Difficulty in walking, not elsewhere classified: Secondary | ICD-10-CM | POA: Diagnosis not present

## 2019-06-03 DIAGNOSIS — R339 Retention of urine, unspecified: Secondary | ICD-10-CM | POA: Diagnosis not present

## 2019-06-03 DIAGNOSIS — M6281 Muscle weakness (generalized): Secondary | ICD-10-CM | POA: Diagnosis not present

## 2019-06-05 DIAGNOSIS — M6281 Muscle weakness (generalized): Secondary | ICD-10-CM | POA: Diagnosis not present

## 2019-06-05 DIAGNOSIS — R262 Difficulty in walking, not elsewhere classified: Secondary | ICD-10-CM | POA: Diagnosis not present

## 2019-06-05 DIAGNOSIS — R2681 Unsteadiness on feet: Secondary | ICD-10-CM | POA: Diagnosis not present

## 2019-06-05 DIAGNOSIS — M170 Bilateral primary osteoarthritis of knee: Secondary | ICD-10-CM | POA: Diagnosis not present

## 2019-06-07 ENCOUNTER — Other Ambulatory Visit: Payer: No Typology Code available for payment source

## 2019-06-10 DIAGNOSIS — M170 Bilateral primary osteoarthritis of knee: Secondary | ICD-10-CM | POA: Diagnosis not present

## 2019-06-10 DIAGNOSIS — R262 Difficulty in walking, not elsewhere classified: Secondary | ICD-10-CM | POA: Diagnosis not present

## 2019-06-10 DIAGNOSIS — M6281 Muscle weakness (generalized): Secondary | ICD-10-CM | POA: Diagnosis not present

## 2019-06-10 DIAGNOSIS — R2681 Unsteadiness on feet: Secondary | ICD-10-CM | POA: Diagnosis not present

## 2019-06-12 DIAGNOSIS — R262 Difficulty in walking, not elsewhere classified: Secondary | ICD-10-CM | POA: Diagnosis not present

## 2019-06-12 DIAGNOSIS — R2681 Unsteadiness on feet: Secondary | ICD-10-CM | POA: Diagnosis not present

## 2019-06-12 DIAGNOSIS — M170 Bilateral primary osteoarthritis of knee: Secondary | ICD-10-CM | POA: Diagnosis not present

## 2019-06-12 DIAGNOSIS — M6281 Muscle weakness (generalized): Secondary | ICD-10-CM | POA: Diagnosis not present

## 2019-06-17 DIAGNOSIS — R2681 Unsteadiness on feet: Secondary | ICD-10-CM | POA: Diagnosis not present

## 2019-06-17 DIAGNOSIS — M6281 Muscle weakness (generalized): Secondary | ICD-10-CM | POA: Diagnosis not present

## 2019-06-17 DIAGNOSIS — M170 Bilateral primary osteoarthritis of knee: Secondary | ICD-10-CM | POA: Diagnosis not present

## 2019-06-17 DIAGNOSIS — R262 Difficulty in walking, not elsewhere classified: Secondary | ICD-10-CM | POA: Diagnosis not present

## 2019-06-19 DIAGNOSIS — R262 Difficulty in walking, not elsewhere classified: Secondary | ICD-10-CM | POA: Diagnosis not present

## 2019-06-19 DIAGNOSIS — M170 Bilateral primary osteoarthritis of knee: Secondary | ICD-10-CM | POA: Diagnosis not present

## 2019-06-19 DIAGNOSIS — R2681 Unsteadiness on feet: Secondary | ICD-10-CM | POA: Diagnosis not present

## 2019-06-19 DIAGNOSIS — M6281 Muscle weakness (generalized): Secondary | ICD-10-CM | POA: Diagnosis not present

## 2019-06-24 DIAGNOSIS — M6281 Muscle weakness (generalized): Secondary | ICD-10-CM | POA: Diagnosis not present

## 2019-06-24 DIAGNOSIS — M170 Bilateral primary osteoarthritis of knee: Secondary | ICD-10-CM | POA: Diagnosis not present

## 2019-06-24 DIAGNOSIS — R2681 Unsteadiness on feet: Secondary | ICD-10-CM | POA: Diagnosis not present

## 2019-06-24 DIAGNOSIS — R262 Difficulty in walking, not elsewhere classified: Secondary | ICD-10-CM | POA: Diagnosis not present

## 2019-06-26 DIAGNOSIS — M6281 Muscle weakness (generalized): Secondary | ICD-10-CM | POA: Diagnosis not present

## 2019-06-26 DIAGNOSIS — M170 Bilateral primary osteoarthritis of knee: Secondary | ICD-10-CM | POA: Diagnosis not present

## 2019-06-26 DIAGNOSIS — R262 Difficulty in walking, not elsewhere classified: Secondary | ICD-10-CM | POA: Diagnosis not present

## 2019-06-26 DIAGNOSIS — R2681 Unsteadiness on feet: Secondary | ICD-10-CM | POA: Diagnosis not present

## 2019-07-01 DIAGNOSIS — M170 Bilateral primary osteoarthritis of knee: Secondary | ICD-10-CM | POA: Diagnosis not present

## 2019-07-01 DIAGNOSIS — R2681 Unsteadiness on feet: Secondary | ICD-10-CM | POA: Diagnosis not present

## 2019-07-01 DIAGNOSIS — R262 Difficulty in walking, not elsewhere classified: Secondary | ICD-10-CM | POA: Diagnosis not present

## 2019-07-01 DIAGNOSIS — M6281 Muscle weakness (generalized): Secondary | ICD-10-CM | POA: Diagnosis not present

## 2019-07-03 DIAGNOSIS — R262 Difficulty in walking, not elsewhere classified: Secondary | ICD-10-CM | POA: Diagnosis not present

## 2019-07-03 DIAGNOSIS — R2681 Unsteadiness on feet: Secondary | ICD-10-CM | POA: Diagnosis not present

## 2019-07-03 DIAGNOSIS — M170 Bilateral primary osteoarthritis of knee: Secondary | ICD-10-CM | POA: Diagnosis not present

## 2019-07-03 DIAGNOSIS — M6281 Muscle weakness (generalized): Secondary | ICD-10-CM | POA: Diagnosis not present

## 2019-07-08 DIAGNOSIS — J189 Pneumonia, unspecified organism: Secondary | ICD-10-CM | POA: Diagnosis not present

## 2019-07-10 DIAGNOSIS — R2681 Unsteadiness on feet: Secondary | ICD-10-CM | POA: Diagnosis not present

## 2019-07-10 DIAGNOSIS — M6281 Muscle weakness (generalized): Secondary | ICD-10-CM | POA: Diagnosis not present

## 2019-07-10 DIAGNOSIS — R262 Difficulty in walking, not elsewhere classified: Secondary | ICD-10-CM | POA: Diagnosis not present

## 2019-07-10 DIAGNOSIS — M170 Bilateral primary osteoarthritis of knee: Secondary | ICD-10-CM | POA: Diagnosis not present

## 2019-07-11 DIAGNOSIS — R339 Retention of urine, unspecified: Secondary | ICD-10-CM | POA: Diagnosis not present

## 2019-07-12 DIAGNOSIS — R262 Difficulty in walking, not elsewhere classified: Secondary | ICD-10-CM | POA: Diagnosis not present

## 2019-07-12 DIAGNOSIS — R2681 Unsteadiness on feet: Secondary | ICD-10-CM | POA: Diagnosis not present

## 2019-07-12 DIAGNOSIS — M170 Bilateral primary osteoarthritis of knee: Secondary | ICD-10-CM | POA: Diagnosis not present

## 2019-07-12 DIAGNOSIS — M6281 Muscle weakness (generalized): Secondary | ICD-10-CM | POA: Diagnosis not present

## 2019-07-15 DIAGNOSIS — M6281 Muscle weakness (generalized): Secondary | ICD-10-CM | POA: Diagnosis not present

## 2019-07-15 DIAGNOSIS — R262 Difficulty in walking, not elsewhere classified: Secondary | ICD-10-CM | POA: Diagnosis not present

## 2019-07-15 DIAGNOSIS — R2681 Unsteadiness on feet: Secondary | ICD-10-CM | POA: Diagnosis not present

## 2019-07-15 DIAGNOSIS — M170 Bilateral primary osteoarthritis of knee: Secondary | ICD-10-CM | POA: Diagnosis not present

## 2019-07-17 DIAGNOSIS — M170 Bilateral primary osteoarthritis of knee: Secondary | ICD-10-CM | POA: Diagnosis not present

## 2019-07-17 DIAGNOSIS — M6281 Muscle weakness (generalized): Secondary | ICD-10-CM | POA: Diagnosis not present

## 2019-07-17 DIAGNOSIS — R262 Difficulty in walking, not elsewhere classified: Secondary | ICD-10-CM | POA: Diagnosis not present

## 2019-07-17 DIAGNOSIS — R2681 Unsteadiness on feet: Secondary | ICD-10-CM | POA: Diagnosis not present

## 2019-07-22 DIAGNOSIS — R262 Difficulty in walking, not elsewhere classified: Secondary | ICD-10-CM | POA: Diagnosis not present

## 2019-07-22 DIAGNOSIS — R2681 Unsteadiness on feet: Secondary | ICD-10-CM | POA: Diagnosis not present

## 2019-07-22 DIAGNOSIS — M6281 Muscle weakness (generalized): Secondary | ICD-10-CM | POA: Diagnosis not present

## 2019-07-22 DIAGNOSIS — M170 Bilateral primary osteoarthritis of knee: Secondary | ICD-10-CM | POA: Diagnosis not present

## 2019-08-06 ENCOUNTER — Encounter: Payer: Self-pay | Admitting: Cardiovascular Disease

## 2019-08-06 ENCOUNTER — Other Ambulatory Visit: Payer: Self-pay

## 2019-08-06 ENCOUNTER — Ambulatory Visit: Payer: Medicare HMO | Admitting: Cardiovascular Disease

## 2019-08-06 VITALS — BP 130/76 | HR 63 | Ht 66.0 in | Wt 166.0 lb

## 2019-08-06 DIAGNOSIS — I471 Supraventricular tachycardia: Secondary | ICD-10-CM

## 2019-08-06 DIAGNOSIS — Z8679 Personal history of other diseases of the circulatory system: Secondary | ICD-10-CM | POA: Diagnosis not present

## 2019-08-06 DIAGNOSIS — I447 Left bundle-branch block, unspecified: Secondary | ICD-10-CM

## 2019-08-06 DIAGNOSIS — I340 Nonrheumatic mitral (valve) insufficiency: Secondary | ICD-10-CM

## 2019-08-06 DIAGNOSIS — E785 Hyperlipidemia, unspecified: Secondary | ICD-10-CM

## 2019-08-06 DIAGNOSIS — I5032 Chronic diastolic (congestive) heart failure: Secondary | ICD-10-CM

## 2019-08-06 NOTE — Progress Notes (Signed)
08/06/2019 [name]   10-03-1922  [id number]  Primary Physician [name], MD Primary Cardiologist: Lorretta Harp MD [name], [place]  HPI:  Travis Kelly is a 86 y.o.  married, father of 42, grandfather of 37 grandchildren was accompanied by his daughterNora.I last saw him in the office  12/07/2018. 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 He just stopped driving 3 years ago because of macular degeneration degeneration. He lives in [place] [facility] with his wife. His cardiologist was Dr. Marc Morgans but he is transitioning to me because of proximity. He has a history of moderate mitral regurgitation, hyperlipidemia and left anterior fascicular block. He wishes no invasive procedures for his mitral valve. [facility] PS[facility] which broke. With Valsalva maneuver. He was placed on when necessary metoprolol. He has had several episodes of PSVT since which have similarly broke with vagal maneuvers. He is fairly active.   He was [facility] 04/16[facility]2018 with PSVT. He was seen in consultation the following day by Dr. Rayann Heman who put him on flecainide 50 mg p.o. twice daily which has resulted in marked improvement in his PSVT episodes. He has had no no recurrent episodes since I saw him in February.  Unfortunately, his wife of 35 years died in 08-01-18. He lives alone at [place] facility. Marland Kitchen  He walks with the aid of a walker.  He is otherwise minimally symptomatic.   Since I saw him a year ago he remained stable.  He does walk with a walker.  He denies chest pain or shortness of breath.   Current Meds  Medication Sig  . acetaminophen (TYLENOL) 500 MG tablet Take 1,000 mg by mouth daily as needed for mild pain.  . Cholecalciferol (VITAMIN D) 2000 units CAPS Take 2,000 Units by mouth daily.  . Cyanocobalamin (VITAMIN B 12 PO) Take  5,000 mg by mouth daily.   . finasteride (PROSCAR) 5 MG tablet Take 5 mg by mouth daily.  . flecainide (TAMBOCOR) 50 MG tablet Take 1 tablet (50 mg total) by mouth every 12 (twelve) hours.  . fluticasone (FLONASE) 50 MCG/ACT nasal spray Place 1-2 sprays into both nostrils daily as needed for allergies or rhinitis.  . furosemide (LASIX) 20 MG tablet TAKE 1 TABLET EVERY DAY (Patient taking differently: Take 20 mg by mouth daily. )  . GLUCOSAMINE HCL PO Take 1 tablet by mouth daily.   . hydroxypropyl methylcellulose / hypromellose (ISOPTO TEARS / GONIOVISC) 2.5 % ophthalmic solution Place 1 drop into both eyes 2 (two) times daily.  . Lutein 20 MG CAPS Take 1 capsule by mouth daily.   . meloxicam (MOBIC) 15 MG tablet Take 15 mg by mouth daily as needed for pain.  . metoprolol tartrate (LOPRESSOR) 25 MG tablet TAKE 1 TABLET BY MOUTH THREE TIMES A DAY  . Misc Natural Products (TURMERIC CURCUMIN) CAPS Take 1 capsule by mouth daily.  . Multiple Minerals-Vitamins (CALCIUM-MAGNESIUM-ZINC-D3 PO) Take 1 tablet by mouth daily.  . Multiple Vitamin (MULTIVITAMIN) tablet Take 1 tablet by mouth daily.  . Multiple Vitamins-Minerals (OCUVITE EYE HEALTH FORMULA) CAPS Take 1 tablet by mouth daily.  . naproxen (NAPROSYN) 375 MG tablet Take 375 mg by mouth daily as needed for mild pain.  Marland Kitchen OVER THE COUNTER MEDICATION 1 Container by Mouth Rinse route 2 (two) times daily. [employer] Peroxyl  . RA KRILL OIL 500 MG  CAPS Take 500 mg by mouth daily.  . vitamin C (ASCORBIC ACID) 500 MG tablet Take 500 mg by mouth daily.     Allergies  Allergen Reactions  . Rocephin [Ceftriaxone Sodium In Dextrose]     Vomiting, hallucinations  . Barbital Rash and Other (See Comments)    Blue lips and swollen tongue    Social History   Socioeconomic History  . Marital status: Married    Spouse name: Not on file  . Number of children: Not on file  . Years of education: Not on file  . Highest education level: Not on file    Occupational History  . Not on file  Tobacco Use  . Smoking status: Former Research scientist (life sciences)  . Smokeless tobacco: Never Used  . Tobacco comment: 04/17/2018 "quit smoking when I was 14"  Substance and Sexual Activity  . Alcohol use: Yes    Alcohol/week: 3.0 standard drinks    Types: 3 Glasses of wine per week  . Drug use: Never  . Sexual activity: Not on file  Other Topics Concern  . Not on file  Social History Narrative  . Not on file   Social Determinants of Health   Financial Resource Strain:   . Difficulty of Paying Living Expenses:   Food Insecurity:   . Worried About Charity fundraiser in the Last Year:   . Arboriculturist in the Last Year:   Transportation Needs:   . Film/video editor (Medical):   Marland Kitchen Lack of Transportation (Non-Medical):   Physical Activity:   . Days of Exercise per Week:   . Minutes of Exercise per Session:   Stress:   . Feeling of Stress :   Social Connections:   . Frequency of Communication with Friends and Family:   . Frequency of Social Gatherings with Friends and Family:   . Attends Religious Services:   . Active Member of Clubs or Organizations:   . Attends Archivist Meetings:   Marland Kitchen Marital Status:   Intimate Partner Violence:   . Fear of Current or Ex-Partner:   . Emotionally Abused:   Marland Kitchen Physically Abused:   . Sexually Abused:      Review of Systems: General: negative for chills, fever, night sweats or weight changes.  Cardiovascular: negative for chest pain, dyspnea on exertion, edema, orthopnea, palpitations, paroxysmal nocturnal dyspnea or shortness of breath Dermatological: negative for rash Respiratory: negative for cough or wheezing Urologic: negative for hematuria Abdominal: negative for nausea, vomiting, diarrhea, bright red blood per rectum, melena, or hematemesis Neurologic: negative for visual changes, syncope, or dizziness All other systems reviewed and are otherwise negative except as noted above.    Blood  pressure 130/76, pulse 63, height 5\' 6"  (1.676 m), weight 166 lb (75.3 kg), SpO2 97 %.  General appearance: alert and no distress Neck: no adenopathy, no carotid bruit, no JVD, supple, symmetrical, trachea midline and thyroid not enlarged, symmetric, no tenderness/mass/nodules Lungs: clear to auscultation bilaterally Heart: regular rate and rhythm, S1, S2 normal, no murmur, click, rub or gallop Extremities: extremities normal, atraumatic, no cyanosis or edema Pulses: 2+ and symmetric Skin: Skin color, texture, turgor normal. No rashes or lesions Neurologic: Alert and oriented X 3, normal strength and tone. Normal symmetric reflexes. Normal coordination and gait  EKG sinus bradycardia 55 with left bundle branch block.  I personally reviewed this EKG.  ASSESSMENT AND PLAN:   History of PSVT  History of PSVT without recurrence on flecainide and beta-blocker  Moderate mitral regurgitation History of mild aortic stenosis and moderate mitral gravitation by 2D echo performed 04/17/2018 with a PA pressure of 50 mmHg.  Dyslipidemia History of hyperlipidemia not on statin therapy  Chronic diastolic CHF (congestive heart failure) (HCC) History of diastolic heart failure on chronic diuretic therapy.  LBBB (left bundle branch block) Chronic      Lorretta Harp MD [facility], [facility] 08/06/2019 12:49 PM

## 2019-08-06 NOTE — Assessment & Plan Note (Signed)
History of mild aortic stenosis and moderate mitral gravitation by 2D echo performed 04/17/2018 with a PA pressure of 50 mmHg.

## 2019-08-06 NOTE — Assessment & Plan Note (Signed)
History of diastolic heart failure on chronic diuretic therapy.

## 2019-08-06 NOTE — Assessment & Plan Note (Signed)
Chronic. 

## 2019-08-06 NOTE — Assessment & Plan Note (Signed)
History of hyperlipidemia not on statin therapy. 

## 2019-08-06 NOTE — Assessment & Plan Note (Signed)
History of PSVT without recurrence on flecainide and beta-blocker

## 2019-08-06 NOTE — Patient Instructions (Signed)

## 2019-08-12 DIAGNOSIS — R339 Retention of urine, unspecified: Secondary | ICD-10-CM | POA: Diagnosis not present

## 2019-08-21 DIAGNOSIS — M25511 Pain in right shoulder: Secondary | ICD-10-CM | POA: Diagnosis not present

## 2019-08-21 DIAGNOSIS — M25512 Pain in left shoulder: Secondary | ICD-10-CM | POA: Diagnosis not present

## 2019-08-21 DIAGNOSIS — M6281 Muscle weakness (generalized): Secondary | ICD-10-CM | POA: Diagnosis not present

## 2019-08-23 DIAGNOSIS — M6281 Muscle weakness (generalized): Secondary | ICD-10-CM | POA: Diagnosis not present

## 2019-08-23 DIAGNOSIS — M25512 Pain in left shoulder: Secondary | ICD-10-CM | POA: Diagnosis not present

## 2019-08-23 DIAGNOSIS — M25511 Pain in right shoulder: Secondary | ICD-10-CM | POA: Diagnosis not present

## 2019-08-30 DIAGNOSIS — M6281 Muscle weakness (generalized): Secondary | ICD-10-CM | POA: Diagnosis not present

## 2019-08-30 DIAGNOSIS — M25511 Pain in right shoulder: Secondary | ICD-10-CM | POA: Diagnosis not present

## 2019-08-30 DIAGNOSIS — M25512 Pain in left shoulder: Secondary | ICD-10-CM | POA: Diagnosis not present

## 2019-09-02 DIAGNOSIS — M25511 Pain in right shoulder: Secondary | ICD-10-CM | POA: Diagnosis not present

## 2019-09-02 DIAGNOSIS — M6281 Muscle weakness (generalized): Secondary | ICD-10-CM | POA: Diagnosis not present

## 2019-09-02 DIAGNOSIS — M25512 Pain in left shoulder: Secondary | ICD-10-CM | POA: Diagnosis not present

## 2019-09-11 DIAGNOSIS — M25512 Pain in left shoulder: Secondary | ICD-10-CM | POA: Diagnosis not present

## 2019-09-11 DIAGNOSIS — M6281 Muscle weakness (generalized): Secondary | ICD-10-CM | POA: Diagnosis not present

## 2019-09-11 DIAGNOSIS — M25511 Pain in right shoulder: Secondary | ICD-10-CM | POA: Diagnosis not present

## 2019-09-13 DIAGNOSIS — M25512 Pain in left shoulder: Secondary | ICD-10-CM | POA: Diagnosis not present

## 2019-09-13 DIAGNOSIS — M6281 Muscle weakness (generalized): Secondary | ICD-10-CM | POA: Diagnosis not present

## 2019-09-13 DIAGNOSIS — M25511 Pain in right shoulder: Secondary | ICD-10-CM | POA: Diagnosis not present

## 2019-09-16 DIAGNOSIS — M6281 Muscle weakness (generalized): Secondary | ICD-10-CM | POA: Diagnosis not present

## 2019-09-16 DIAGNOSIS — M25511 Pain in right shoulder: Secondary | ICD-10-CM | POA: Diagnosis not present

## 2019-09-16 DIAGNOSIS — R339 Retention of urine, unspecified: Secondary | ICD-10-CM | POA: Diagnosis not present

## 2019-09-16 DIAGNOSIS — M25512 Pain in left shoulder: Secondary | ICD-10-CM | POA: Diagnosis not present

## 2019-09-18 ENCOUNTER — Telehealth: Payer: Self-pay | Admitting: Cardiology

## 2019-09-18 ENCOUNTER — Telehealth: Payer: Self-pay | Admitting: Radiology

## 2019-09-18 DIAGNOSIS — I499 Cardiac arrhythmia, unspecified: Secondary | ICD-10-CM | POA: Diagnosis not present

## 2019-09-18 DIAGNOSIS — I5032 Chronic diastolic (congestive) heart failure: Secondary | ICD-10-CM | POA: Diagnosis not present

## 2019-09-18 DIAGNOSIS — J189 Pneumonia, unspecified organism: Secondary | ICD-10-CM | POA: Diagnosis not present

## 2019-09-18 DIAGNOSIS — I4891 Unspecified atrial fibrillation: Secondary | ICD-10-CM

## 2019-09-18 DIAGNOSIS — M6281 Muscle weakness (generalized): Secondary | ICD-10-CM | POA: Diagnosis not present

## 2019-09-18 DIAGNOSIS — N401 Enlarged prostate with lower urinary tract symptoms: Secondary | ICD-10-CM | POA: Diagnosis not present

## 2019-09-18 DIAGNOSIS — I130 Hypertensive heart and chronic kidney disease with heart failure and stage 1 through stage 4 chronic kidney disease, or unspecified chronic kidney disease: Secondary | ICD-10-CM | POA: Diagnosis not present

## 2019-09-18 DIAGNOSIS — I471 Supraventricular tachycardia: Secondary | ICD-10-CM | POA: Diagnosis not present

## 2019-09-18 DIAGNOSIS — R34 Anuria and oliguria: Secondary | ICD-10-CM | POA: Diagnosis not present

## 2019-09-18 DIAGNOSIS — N1831 Chronic kidney disease, stage 3a: Secondary | ICD-10-CM | POA: Diagnosis not present

## 2019-09-18 DIAGNOSIS — M25511 Pain in right shoulder: Secondary | ICD-10-CM | POA: Diagnosis not present

## 2019-09-18 DIAGNOSIS — N390 Urinary tract infection, site not specified: Secondary | ICD-10-CM | POA: Diagnosis not present

## 2019-09-18 DIAGNOSIS — M25512 Pain in left shoulder: Secondary | ICD-10-CM | POA: Diagnosis not present

## 2019-09-18 NOTE — Addendum Note (Signed)
Addended by: Patria Mane A on: 09/18/2019 02:18 PM   Modules accepted: Orders

## 2019-09-18 NOTE — Telephone Encounter (Signed)
Pt's son in law Margarita Sermons called- he says the patient was noted to be "in and out" of AF by the patient's PCP.  The patient is minimally symptomatic. After discussion it was decided to order a 3 day ZIO to assess the patient's AF burden. He is currently on ASA only.  Kerin Ransom PA-C 09/18/2019 2:08 PM

## 2019-09-18 NOTE — Telephone Encounter (Signed)
Enrolled patient for a 3 day Zio monitor to be mailed to patients home.  

## 2019-09-18 NOTE — Telephone Encounter (Signed)
Order placed for 3 day ZIO

## 2019-09-22 ENCOUNTER — Encounter (HOSPITAL_BASED_OUTPATIENT_CLINIC_OR_DEPARTMENT_OTHER): Payer: Self-pay

## 2019-09-22 ENCOUNTER — Inpatient Hospital Stay (HOSPITAL_BASED_OUTPATIENT_CLINIC_OR_DEPARTMENT_OTHER)
Admission: EM | Admit: 2019-09-22 | Discharge: 2019-09-27 | DRG: 291 | Disposition: A | Discharge status: Home Health | Payer: Medicare HMO | Attending: Internal Medicine | Admitting: Internal Medicine

## 2019-09-22 ENCOUNTER — Other Ambulatory Visit: Payer: Self-pay

## 2019-09-22 ENCOUNTER — Emergency Department (HOSPITAL_BASED_OUTPATIENT_CLINIC_OR_DEPARTMENT_OTHER): Payer: Medicare HMO

## 2019-09-22 DIAGNOSIS — M1990 Unspecified osteoarthritis, unspecified site: Secondary | ICD-10-CM | POA: Diagnosis present

## 2019-09-22 DIAGNOSIS — N289 Disorder of kidney and ureter, unspecified: Secondary | ICD-10-CM | POA: Diagnosis not present

## 2019-09-22 DIAGNOSIS — I5033 Acute on chronic diastolic (congestive) heart failure: Secondary | ICD-10-CM | POA: Diagnosis present

## 2019-09-22 DIAGNOSIS — N390 Urinary tract infection, site not specified: Secondary | ICD-10-CM | POA: Diagnosis not present

## 2019-09-22 DIAGNOSIS — Z79899 Other long term (current) drug therapy: Secondary | ICD-10-CM

## 2019-09-22 DIAGNOSIS — R531 Weakness: Secondary | ICD-10-CM | POA: Diagnosis not present

## 2019-09-22 DIAGNOSIS — J9601 Acute respiratory failure with hypoxia: Secondary | ICD-10-CM | POA: Diagnosis present

## 2019-09-22 DIAGNOSIS — Z87891 Personal history of nicotine dependence: Secondary | ICD-10-CM

## 2019-09-22 DIAGNOSIS — I340 Nonrheumatic mitral (valve) insufficiency: Secondary | ICD-10-CM | POA: Diagnosis present

## 2019-09-22 DIAGNOSIS — N1832 Chronic kidney disease, stage 3b: Secondary | ICD-10-CM | POA: Diagnosis not present

## 2019-09-22 DIAGNOSIS — I447 Left bundle-branch block, unspecified: Secondary | ICD-10-CM | POA: Diagnosis present

## 2019-09-22 DIAGNOSIS — R0902 Hypoxemia: Secondary | ICD-10-CM | POA: Diagnosis not present

## 2019-09-22 DIAGNOSIS — R6520 Severe sepsis without septic shock: Secondary | ICD-10-CM | POA: Diagnosis not present

## 2019-09-22 DIAGNOSIS — Z66 Do not resuscitate: Secondary | ICD-10-CM | POA: Diagnosis present

## 2019-09-22 DIAGNOSIS — R338 Other retention of urine: Secondary | ICD-10-CM | POA: Diagnosis not present

## 2019-09-22 DIAGNOSIS — E785 Hyperlipidemia, unspecified: Secondary | ICD-10-CM | POA: Diagnosis present

## 2019-09-22 DIAGNOSIS — L89301 Pressure ulcer of unspecified buttock, stage 1: Secondary | ICD-10-CM | POA: Diagnosis not present

## 2019-09-22 DIAGNOSIS — I5043 Acute on chronic combined systolic (congestive) and diastolic (congestive) heart failure: Secondary | ICD-10-CM | POA: Diagnosis not present

## 2019-09-22 DIAGNOSIS — N183 Chronic kidney disease, stage 3 unspecified: Secondary | ICD-10-CM | POA: Diagnosis not present

## 2019-09-22 DIAGNOSIS — I5042 Chronic combined systolic (congestive) and diastolic (congestive) heart failure: Secondary | ICD-10-CM | POA: Diagnosis not present

## 2019-09-22 DIAGNOSIS — Z515 Encounter for palliative care: Secondary | ICD-10-CM | POA: Diagnosis not present

## 2019-09-22 DIAGNOSIS — Z20822 Contact with and (suspected) exposure to covid-19: Secondary | ICD-10-CM | POA: Diagnosis not present

## 2019-09-22 DIAGNOSIS — I4819 Other persistent atrial fibrillation: Secondary | ICD-10-CM | POA: Diagnosis not present

## 2019-09-22 DIAGNOSIS — N179 Acute kidney failure, unspecified: Secondary | ICD-10-CM | POA: Diagnosis not present

## 2019-09-22 DIAGNOSIS — R279 Unspecified lack of coordination: Secondary | ICD-10-CM | POA: Diagnosis not present

## 2019-09-22 DIAGNOSIS — N319 Neuromuscular dysfunction of bladder, unspecified: Secondary | ICD-10-CM | POA: Diagnosis present

## 2019-09-22 DIAGNOSIS — Z9181 History of falling: Secondary | ICD-10-CM | POA: Diagnosis not present

## 2019-09-22 DIAGNOSIS — R0602 Shortness of breath: Secondary | ICD-10-CM | POA: Diagnosis not present

## 2019-09-22 DIAGNOSIS — Z7189 Other specified counseling: Secondary | ICD-10-CM | POA: Diagnosis not present

## 2019-09-22 DIAGNOSIS — R54 Age-related physical debility: Secondary | ICD-10-CM | POA: Diagnosis present

## 2019-09-22 DIAGNOSIS — I440 Atrioventricular block, first degree: Secondary | ICD-10-CM | POA: Diagnosis present

## 2019-09-22 DIAGNOSIS — I472 Ventricular tachycardia: Secondary | ICD-10-CM | POA: Diagnosis not present

## 2019-09-22 DIAGNOSIS — I429 Cardiomyopathy, unspecified: Secondary | ICD-10-CM | POA: Diagnosis not present

## 2019-09-22 DIAGNOSIS — A419 Sepsis, unspecified organism: Secondary | ICD-10-CM | POA: Diagnosis not present

## 2019-09-22 DIAGNOSIS — Z7901 Long term (current) use of anticoagulants: Secondary | ICD-10-CM | POA: Diagnosis not present

## 2019-09-22 DIAGNOSIS — I471 Supraventricular tachycardia: Secondary | ICD-10-CM | POA: Diagnosis present

## 2019-09-22 DIAGNOSIS — I2722 Pulmonary hypertension due to left heart disease: Secondary | ICD-10-CM | POA: Diagnosis not present

## 2019-09-22 DIAGNOSIS — Z7982 Long term (current) use of aspirin: Secondary | ICD-10-CM

## 2019-09-22 DIAGNOSIS — I509 Heart failure, unspecified: Secondary | ICD-10-CM

## 2019-09-22 DIAGNOSIS — R339 Retention of urine, unspecified: Secondary | ICD-10-CM | POA: Diagnosis not present

## 2019-09-22 DIAGNOSIS — I5021 Acute systolic (congestive) heart failure: Secondary | ICD-10-CM | POA: Diagnosis not present

## 2019-09-22 DIAGNOSIS — Z8679 Personal history of other diseases of the circulatory system: Secondary | ICD-10-CM

## 2019-09-22 DIAGNOSIS — R52 Pain, unspecified: Secondary | ICD-10-CM | POA: Diagnosis not present

## 2019-09-22 DIAGNOSIS — I480 Paroxysmal atrial fibrillation: Secondary | ICD-10-CM | POA: Diagnosis present

## 2019-09-22 DIAGNOSIS — I5023 Acute on chronic systolic (congestive) heart failure: Secondary | ICD-10-CM | POA: Diagnosis not present

## 2019-09-22 DIAGNOSIS — I5031 Acute diastolic (congestive) heart failure: Secondary | ICD-10-CM | POA: Diagnosis not present

## 2019-09-22 DIAGNOSIS — I4891 Unspecified atrial fibrillation: Secondary | ICD-10-CM | POA: Diagnosis not present

## 2019-09-22 DIAGNOSIS — I517 Cardiomegaly: Secondary | ICD-10-CM | POA: Diagnosis not present

## 2019-09-22 DIAGNOSIS — Z743 Need for continuous supervision: Secondary | ICD-10-CM | POA: Diagnosis not present

## 2019-09-22 LAB — BASIC METABOLIC PANEL
Anion gap: 18 — ABNORMAL HIGH (ref 5–15)
BUN: 39 mg/dL — ABNORMAL HIGH (ref 8–23)
CO2: 18 mmol/L — ABNORMAL LOW (ref 22–32)
Calcium: 8.7 mg/dL — ABNORMAL LOW (ref 8.9–10.3)
Chloride: 100 mmol/L (ref 98–111)
Creatinine, Ser: 1.85 mg/dL — ABNORMAL HIGH (ref 0.61–1.24)
GFR calc Af Amer: 35 mL/min — ABNORMAL LOW (ref >60)
GFR calc non Af Amer: 30 mL/min — ABNORMAL LOW (ref >60)
Glucose, Bld: 122 mg/dL — ABNORMAL HIGH (ref 70–99)
Potassium: 4.8 mmol/L (ref 3.5–5.1)
Sodium: 136 mmol/L (ref 135–145)

## 2019-09-22 LAB — CBC WITH DIFFERENTIAL/PLATELET
Abs Immature Granulocytes: 0.1 10*3/uL — ABNORMAL HIGH (ref 0.00–0.07)
Basophils Absolute: 0 10*3/uL (ref 0.0–0.1)
Basophils Relative: 0 %
Eosinophils Absolute: 0 10*3/uL (ref 0.0–0.5)
Eosinophils Relative: 0 %
HCT: 40 % (ref 39.0–52.0)
Hemoglobin: 12.8 g/dL — ABNORMAL LOW (ref 13.0–17.0)
Immature Granulocytes: 1 %
Lymphocytes Relative: 5 %
Lymphs Abs: 0.8 10*3/uL (ref 0.7–4.0)
MCH: 33.8 pg (ref 26.0–34.0)
MCHC: 32 g/dL (ref 30.0–36.0)
MCV: 105.5 fL — ABNORMAL HIGH (ref 80.0–100.0)
Monocytes Absolute: 1 10*3/uL (ref 0.1–1.0)
Monocytes Relative: 6 %
Neutro Abs: 14.8 10*3/uL — ABNORMAL HIGH (ref 1.7–7.7)
Neutrophils Relative %: 88 %
Platelets: 171 10*3/uL (ref 150–400)
RBC: 3.79 MIL/uL — ABNORMAL LOW (ref 4.22–5.81)
RDW: 14.6 % (ref 11.5–15.5)
WBC: 16.8 10*3/uL — ABNORMAL HIGH (ref 4.0–10.5)
nRBC: 0 % (ref 0.0–0.2)

## 2019-09-22 LAB — URINALYSIS, MICROSCOPIC (REFLEX)

## 2019-09-22 LAB — TROPONIN I (HIGH SENSITIVITY)
Troponin I (High Sensitivity): 46 ng/L — ABNORMAL HIGH (ref <18)
Troponin I (High Sensitivity): 49 ng/L — ABNORMAL HIGH (ref <18)

## 2019-09-22 LAB — URINALYSIS, ROUTINE W REFLEX MICROSCOPIC
Bilirubin Urine: NEGATIVE
Glucose, UA: NEGATIVE mg/dL
Hgb urine dipstick: NEGATIVE
Ketones, ur: NEGATIVE mg/dL
Leukocytes,Ua: NEGATIVE
Nitrite: NEGATIVE
Protein, ur: 100 mg/dL — AB
Specific Gravity, Urine: >1.03 — ABNORMAL HIGH (ref 1.005–1.030)
pH: 5.5 (ref 5.0–8.0)

## 2019-09-22 LAB — SARS CORONAVIRUS 2 BY RT PCR (HOSPITAL ORDER, PERFORMED IN ~~LOC~~ HOSPITAL LAB): SARS Coronavirus 2: NEGATIVE

## 2019-09-22 LAB — BRAIN NATRIURETIC PEPTIDE: B Natriuretic Peptide: 1552.3 pg/mL — ABNORMAL HIGH (ref 0.0–100.0)

## 2019-09-22 MED ORDER — LUTEIN 20 MG PO CAPS: 1.0000 | ORAL_CAPSULE | Freq: Every day | ORAL | Status: DC

## 2019-09-22 MED ORDER — FLUTICASONE PROPIONATE 50 MCG/ACT NA SUSP
1.0000 | Freq: Every day | NASAL | Status: DC | PRN
  Filled 2019-09-22: qty 16

## 2019-09-22 MED ORDER — FUROSEMIDE 10 MG/ML IJ SOLN
40.0000 mg | Freq: Once | INTRAMUSCULAR | Status: AC
  Administered 2019-09-22: 40 mg via INTRAVENOUS
  Filled 2019-09-22: qty 4

## 2019-09-22 MED ORDER — SENNA 8.6 MG PO TABS
1.0000 | ORAL_TABLET | Freq: Every evening | ORAL | Status: DC
  Administered 2019-09-22 – 2019-09-26 (×3): 8.6 mg via ORAL
  Filled 2019-09-22 (×3): qty 1

## 2019-09-22 MED ORDER — ONDANSETRON HCL 4 MG/2ML IJ SOLN: 4.0000 mg | Freq: Four times a day (QID) | INTRAMUSCULAR | Status: DC | PRN

## 2019-09-22 MED ORDER — METOPROLOL TARTRATE 12.5 MG HALF TABLET: 12.5000 mg | ORAL_TABLET | Freq: Two times a day (BID) | ORAL | Status: DC

## 2019-09-22 MED ORDER — METOPROLOL TARTRATE 25 MG PO TABS
25.0000 mg | ORAL_TABLET | Freq: Three times a day (TID) | ORAL | Status: DC
  Filled 2019-09-22: qty 1

## 2019-09-22 MED ORDER — CALCIUM CARBONATE-VITAMIN D 500-200 MG-UNIT PO TABS
1.0000 | ORAL_TABLET | Freq: Every evening | ORAL | Status: DC
  Administered 2019-09-22 – 2019-09-26 (×4): 1 via ORAL
  Filled 2019-09-22 (×4): qty 1

## 2019-09-22 MED ORDER — ASPIRIN EC 81 MG PO TBEC
81.0000 mg | DELAYED_RELEASE_TABLET | Freq: Every day | ORAL | Status: DC
  Administered 2019-09-23: 81 mg via ORAL
  Filled 2019-09-22: qty 1

## 2019-09-22 MED ORDER — OCUVITE EYE HEALTH FORMULA PO CAPS: 1.0000 | ORAL_CAPSULE | Freq: Every evening | ORAL | Status: DC

## 2019-09-22 MED ORDER — OCUVITE-LUTEIN PO CAPS
1.0000 | ORAL_CAPSULE | Freq: Every day | ORAL | Status: DC
  Filled 2019-09-22: qty 1

## 2019-09-22 MED ORDER — METOPROLOL TARTRATE 12.5 MG HALF TABLET
12.5000 mg | ORAL_TABLET | Freq: Two times a day (BID) | ORAL | Status: DC
  Administered 2019-09-22 – 2019-09-25 (×5): 12.5 mg via ORAL
  Filled 2019-09-22 (×6): qty 1

## 2019-09-22 MED ORDER — VITAMIN D 25 MCG (1000 UNIT) PO TABS
1000.0000 [IU] | ORAL_TABLET | Freq: Every evening | ORAL | Status: DC
  Administered 2019-09-22 – 2019-09-26 (×4): 1000 [IU] via ORAL
  Filled 2019-09-22 (×4): qty 1

## 2019-09-22 MED ORDER — ASCORBIC ACID 500 MG PO TABS
500.0000 mg | ORAL_TABLET | Freq: Every evening | ORAL | Status: DC
  Administered 2019-09-22 – 2019-09-26 (×4): 500 mg via ORAL
  Filled 2019-09-22 (×4): qty 1

## 2019-09-22 MED ORDER — SODIUM CHLORIDE 0.9% FLUSH: 3.0000 mL | INTRAVENOUS | Status: DC | PRN

## 2019-09-22 MED ORDER — SODIUM CHLORIDE 0.9 % IV SOLN: 250.0000 mL | INTRAVENOUS | Status: DC | PRN

## 2019-09-22 MED ORDER — FUROSEMIDE 10 MG/ML IJ SOLN: 40.0000 mg | Freq: Every day | INTRAMUSCULAR | Status: DC

## 2019-09-22 MED ORDER — SODIUM CHLORIDE 0.9% FLUSH
3.0000 mL | Freq: Two times a day (BID) | INTRAVENOUS | Status: DC
  Administered 2019-09-22 – 2019-09-26 (×6): 3 mL via INTRAVENOUS

## 2019-09-22 MED ORDER — FLECAINIDE ACETATE 50 MG PO TABS
50.0000 mg | ORAL_TABLET | Freq: Two times a day (BID) | ORAL | Status: DC
  Administered 2019-09-22 – 2019-09-24 (×5): 50 mg via ORAL
  Filled 2019-09-22 (×5): qty 1

## 2019-09-22 MED ORDER — FINASTERIDE 5 MG PO TABS
5.0000 mg | ORAL_TABLET | Freq: Every day | ORAL | Status: DC
  Administered 2019-09-22 – 2019-09-26 (×5): 5 mg via ORAL
  Filled 2019-09-22 (×7): qty 1

## 2019-09-22 MED ORDER — ENOXAPARIN SODIUM 40 MG/0.4ML ~~LOC~~ SOLN
40.0000 mg | SUBCUTANEOUS | Status: DC
  Administered 2019-09-22: 40 mg via SUBCUTANEOUS
  Filled 2019-09-22: qty 0.4

## 2019-09-22 MED ORDER — PROSIGHT PO TABS
1.0000 | ORAL_TABLET | Freq: Every day | ORAL | Status: DC
  Administered 2019-09-22 – 2019-09-27 (×6): 1 via ORAL
  Filled 2019-09-22 (×6): qty 1

## 2019-09-22 MED ORDER — ACETAMINOPHEN 325 MG PO TABS
650.0000 mg | ORAL_TABLET | ORAL | Status: DC | PRN
  Administered 2019-09-24 – 2019-09-25 (×4): 650 mg via ORAL
  Filled 2019-09-22 (×4): qty 2

## 2019-09-22 MED ORDER — CIPROFLOXACIN IN D5W 200 MG/100ML IV SOLN
200.0000 mg | Freq: Once | INTRAVENOUS | Status: DC
  Filled 2019-09-22: qty 100

## 2019-09-22 MED ORDER — CEPHALEXIN 500 MG PO CAPS
500.0000 mg | ORAL_CAPSULE | Freq: Two times a day (BID) | ORAL | Status: AC
  Administered 2019-09-22 – 2019-09-25 (×7): 500 mg via ORAL
  Filled 2019-09-22 (×7): qty 1

## 2019-09-22 MED ORDER — POLYVINYL ALCOHOL 1.4 % OP SOLN
1.0000 [drp] | Freq: Every day | OPHTHALMIC | Status: DC | PRN
  Filled 2019-09-22: qty 15

## 2019-09-22 NOTE — ED Provider Notes (Signed)
[facility] EMERGENCY DEPARTMENT Provider Note   CSN: [id number] Arrival date & time: 09/22/19  1156     History Chief Complaint  Patient presents with  . Urinary Retention  . Shortness of Breath    Travis Kelly is a 86 y.o. male.  HPI   Patient presents to the emergency room for evaluation of decreased urine output and shortness of breath associated with increased leg swelling.  Patient has a history of neurogenic bladder and has been self catheterizing for almost a year now.  In the last week patient started having some urinary discomfort and was started on antibiotics for urinary tract infection.  In the last couple of days the patient has been continued to self catheterize but unfortunately has had minimal urine output.  In the last day or so he started having difficulty with his breathing.  Family noted that it was more labored than usual.  They have also noticed some increased leg swelling.  Patient denies any trouble with any chest pain.  He is not having any abdominal pain he does not feel like his bladder is distended or uncomfortable.  Past Medical History:  Diagnosis Date  . Age-related macular degeneration, dry, left eye   . Age-related macular degeneration, wet, right eye ([place])   . Arthritis    "right knee" (04/17/2018)  . CHF (congestive heart failure) ([place])   . GERD (gastroesophageal reflux disease)   . Heart murmur   . History of duodenal ulcer   . History of hiatal hernia   . LAFB (left anterior fascicular block)   . Mitral valve disorder    Moderate MR  . Nephrolithiasis 2013  . Pneumonia 1938; ?date   "once as a child; once as an adult" (04/17/2018)  . PSVT (paroxysmal supraventricular tachycardia) ([place]) 06/2016    Patient Active Problem List   Diagnosis Date Noted  . Pseudogout of knee, right 05/03/2018  . Chronic diastolic CHF (congestive heart failure) ([place]) 04/18/2018  . Abnormal liver function 04/17/2018  . BPH (benign prostatic  hyperplasia) 04/16/2018  . Chronic renal insufficiency, stage 3 (moderate) 07/10/2017  . Degenerative joint disease of shoulder region 03/09/2017  . LBBB (left bundle branch block) 03/09/2017  . Dyspnea on exertion 02/24/2017  . Moderate mitral regurgitation 11/08/2016  . Elevated troponin 07/05/2016  . Normochromic normocytic anemia 07/05/2016  . History of PSVT  07/04/2016  . Blepharitis of upper and lower eyelids of both eyes 04/07/2015  . Pseudophakia of both eyes 04/07/2015  . PVD (posterior vitreous detachment), left 04/07/2015  . Status post cataract extraction and insertion of intraocular lens 01/25/2012  . Cataract, immature 05/10/2011  . Macular degeneration 05/10/2011  . Shoulder pain, bilateral 05/10/2011  . Calculus of kidney 08/13/2010  . Brachial neuritis or radiculitis 07/19/2010  . Cervical spondylosis without myelopathy 07/19/2010  . Lipoma 04/29/2010  . Dyslipidemia 02/07/2007  . Esophageal reflux 02/07/2007    Past Surgical History:  Procedure Laterality Date  . CARDIAC CATHETERIZATION    . CATARACT EXTRACTION W/ INTRAOCULAR LENS  IMPLANT, BILATERAL Bilateral 2013  . SKIN BIOPSY Left    "thigh; suspected CA; it was not"       Family History  Problem Relation Age of Onset  . Cancer Mother   . Pneumonia Father     Social History   Tobacco Use  . Smoking status: Former Research scientist (life sciences)  . Smokeless tobacco: Never Used  . Tobacco comment: 04/17/2018 "quit smoking when I was 14"  Vaping Use  .  Vaping Use: Never used  Substance Use Topics  . Alcohol use: Yes    Alcohol/week: 3.0 standard drinks    Types: 3 Glasses of wine per week  . Drug use: Never    Home Medications Prior to Admission medications   Medication Sig Start Date End Date Taking? Authorizing Provider  acetaminophen (TYLENOL) 500 MG tablet Take 1,000 mg by mouth daily as needed for mild pain.    [provider]  Cholecalciferol (VITAMIN D) 2000 units CAPS Take 2,000 Units by mouth  daily.    [provider]  Cyanocobalamin (VITAMIN B 12 PO) Take 5,000 mg by mouth daily.     [provider]  finasteride (PROSCAR) 5 MG tablet Take 5 mg by mouth daily. 07/25/13   [provider]  flecainide (TAMBOCOR) 50 MG tablet Take 1 tablet (50 mg total) by mouth every 12 (twelve) hours. 11/20/18   Erlene Quan, PA-C  fluticasone (FLONASE) 50 MCG/ACT nasal spray Place 1-2 sprays into both nostrils daily as needed for allergies or rhinitis.    [provider]  furosemide (LASIX) 20 MG tablet TAKE 1 TABLET EVERY DAY Patient taking differently: Take 20 mg by mouth daily.  06/08/18   Lorretta Harp, MD  GLUCOSAMINE HCL PO Take 1 tablet by mouth daily.     [provider]  hydroxypropyl methylcellulose / hypromellose (ISOPTO TEARS / GONIOVISC) 2.5 % ophthalmic solution Place 1 drop into both eyes 2 (two) times daily.    [provider]  Lutein 20 MG CAPS Take 1 capsule by mouth daily.     [provider]  meloxicam (MOBIC) 15 MG tablet Take 15 mg by mouth daily as needed for pain. 06/24/13   [provider]  metoprolol tartrate (LOPRESSOR) 25 MG tablet TAKE 1 TABLET BY MOUTH THREE TIMES A DAY 01/15/19   [place], Doreene Burke, PA-C  Misc Natural Products (TURMERIC CURCUMIN) CAPS Take 1 capsule by mouth daily.    [provider]  Multiple Minerals-Vitamins (CALCIUM-MAGNESIUM-ZINC-D3 PO) Take 1 tablet by mouth daily.    [provider]  Multiple Vitamin (MULTIVITAMIN) tablet Take 1 tablet by mouth daily.    [provider]  Multiple Vitamins-Minerals ([place]) CAPS Take 1 tablet by mouth daily.    [provider]  naproxen (NAPROSYN) 375 MG tablet Take 375 mg by mouth daily as needed for mild pain.    [provider]  OVER THE COUNTER MEDICATION 1 Container by Mouth Rinse route 2 (two) times daily. [employer] Peroxyl    [provider]  RA KRILL OIL 500 MG CAPS Take  500 mg by mouth daily.    [provider]  vitamin C (ASCORBIC ACID) 500 MG tablet Take 500 mg by mouth daily.    [provider]    Allergies    Rocephin [ceftriaxone sodium in dextrose] and Barbital  Review of Systems   Review of Systems  All other systems reviewed and are negative.   Physical Exam Updated Vital Signs BP 109/63   Pulse 96   Temp 98.5 F (36.9 C) (Oral)   Resp 20   Ht 1.676 m (5\' 6" )   Wt 77.1 kg   SpO2 93%   BMI 27.44 kg/m   Physical Exam Vitals and nursing note reviewed.  Constitutional:      Appearance: He is well-developed. He is ill-appearing.     Comments: Elderly, frail  HENT:     Head: Normocephalic and atraumatic.  Right Ear: External ear normal.     Left Ear: External ear normal.  Eyes:     General: No scleral icterus.       Right eye: No discharge.        Left eye: No discharge.     Conjunctiva/sclera: Conjunctivae normal.  Neck:     Trachea: No tracheal deviation.  Cardiovascular:     Rate and Rhythm: Normal rate. Rhythm irregular.  Pulmonary:     Effort: Accessory muscle usage present.     Breath sounds: No stridor. Decreased breath sounds and rales present. No wheezing.  Abdominal:     General: Bowel sounds are normal. There is no distension.     Palpations: Abdomen is soft.     Tenderness: There is no abdominal tenderness. There is no guarding or rebound.  Musculoskeletal:        General: No tenderness.     Cervical back: Neck supple.     Right lower leg: Edema present.     Left lower leg: Edema present.     Comments: Edema of bilateral lower extremities up to the thighs  Skin:    General: Skin is warm and dry.     Findings: No rash.  Neurological:     Mental Status: He is alert.     Cranial Nerves: No cranial nerve deficit (no facial droop, extraocular movements intact, no slurred speech).     Sensory: No sensory deficit.     Motor: No abnormal muscle tone or seizure activity.     Coordination:  Coordination normal.     ED Results / Procedures / Treatments   Labs (all labs ordered are listed, but only abnormal results are displayed) Labs Reviewed  CBC WITH DIFFERENTIAL/PLATELET - Abnormal; Notable for the following components:      Result Value   WBC 16.8 (*)    RBC 3.79 (*)    Hemoglobin 12.8 (*)    MCV 105.5 (*)    Neutro Abs 14.8 (*)    Abs Immature Granulocytes 0.10 (*)    All other components within normal limits  BASIC METABOLIC PANEL - Abnormal; Notable for the following components:   CO2 18 (*)    Glucose, Bld 122 (*)    BUN 39 (*)    Creatinine, Ser 1.85 (*)    Calcium 8.7 (*)    GFR calc non Af Amer 30 (*)    GFR calc Af Amer 35 (*)    Anion gap 18 (*)    All other components within normal limits  URINALYSIS, ROUTINE W REFLEX MICROSCOPIC - Abnormal; Notable for the following components:   Color, Urine AMBER (*)    APPearance CLOUDY (*)    Specific Gravity, Urine >1.030 (*)    Protein, ur 100 (*)    All other components within normal limits  BRAIN NATRIURETIC PEPTIDE - Abnormal; Notable for the following components:   B Natriuretic Peptide 1,552.3 (*)    All other components within normal limits  URINALYSIS, MICROSCOPIC (REFLEX) - Abnormal; Notable for the following components:   Bacteria, UA MANY (*)    All other components within normal limits  TROPONIN I (HIGH SENSITIVITY) - Abnormal; Notable for the following components:   Troponin I (High Sensitivity) 46 (*)    All other components within normal limits  SARS CORONAVIRUS 2 BY RT PCR (HOSPITAL ORDER, [facility])  TROPONIN I (HIGH SENSITIVITY)    EKG EKG Interpretation  Date/Time:  Sunday September 22 2019 12:13:48 EDT Ventricular Rate:  110 PR Interval:    QRS Duration: 168 QT Interval:  411 QTC Calculation: 556 R Axis:   -85 Text Interpretation: Atrial fibrillation Nonspecific IVCD with LAD LVH with secondary repolarization abnormality Since last tracing rate faster  Confirmed by Dorie Rank [phone]) on 09/22/2019 12:27:56 PM   Radiology DG Chest Portable 1 View  Result Date: 09/22/2019 CLINICAL DATA:  Shortness of breath, decreased urine output EXAM: PORTABLE CHEST 1 VIEW COMPARISON:  04/16/2018 FINDINGS: Stable cardiomegaly without CHF or pneumonia. Negative for edema, effusion or pneumothorax. Trachea midline. Aorta atherosclerotic. IMPRESSION: Stable cardiomegaly without acute chest process or interval change. Aortic Atherosclerosis (ICD10-I70.0). Electronically Signed   By: Jerilynn Mages.  Shick M.D.   On: 09/22/2019 13:05    Procedures .Critical Care Performed by: Dorie Rank, MD Authorized by: Dorie Rank, MD   Critical care provider statement:    Critical care time (minutes):  45   Critical care was time spent personally by me on the following activities:  Discussions with consultants, evaluation of patient's response to treatment, examination of patient, ordering and performing treatments and interventions, ordering and review of laboratory studies, ordering and review of radiographic studies, pulse oximetry, re-evaluation of patient's condition, obtaining history from patient or surrogate and review of old charts   (including critical care time)  Medications Ordered in ED Medications - No data to display  ED Course  I have reviewed the triage vital signs and the nursing notes.  Pertinent labs & imaging results that were available during my care of the patient were reviewed by me and considered in my medical decision making (see chart for details).  Clinical Course as of Sep 21 1452  Sun Sep 22, 2019  1236 Patient presented with question of urinary retention.  More concerned for possible acute renal failure with decreased urine output considering his edema and dyspnea.  Will check bladder scan to evaluate for urinary retention    [JK]  7564 Metabolic panel shows worsening renal insufficiency   [JK]  1331 Chest x-ray without findings of CHF BNP  significantly elevated but this is similar to previous   [JK]    Clinical Course User Index [JK] Dorie Rank, MD   MDM Rules/Calculators/A&P                          Patient presented to ED for evaluation of decreased urine output and increasing peripheral edema as well as dyspnea.  Patient does have significant edema on exam.  He does have a new oxygen requirement.  Patient does have worsening renal insufficiency but no signs of acute renal failure to account for his decreased urination.  His urinalysis does show many bacteria but no definite UTI at this point.  Troponin and BNP are elevated.  This may be related to his chronic kidney disease but I suspect there is a component of worsening congestive heart failure.  No signs of pneumonia on x-ray.  Covid test is negative.  I will order a dose of diuretics.  I will consult with the medical service for admission and further treatment. Final Clinical Impression(s) / ED Diagnoses Final diagnoses:  Renal insufficiency  Congestive heart failure, unspecified HF chronicity, unspecified heart failure type [facility])      Dorie Rank, MD 09/22/19 1457

## 2019-09-22 NOTE — Progress Notes (Signed)
Pt received from [facility] via [place]. Oriented to room and call bell. CHG bath complete. VSS. Call bell in reach. Will continue to monitor.  Arletta Bale, RN

## 2019-09-22 NOTE — Plan of Care (Signed)
Transfer from [facility] see [place] communication for further details.

## 2019-09-22 NOTE — H&P (Signed)
History and Physical    Travis Kelly QPR:[id number] DOB: 1922/11/14 DOA: 09/22/2019  PCP: [name], MD  Patient coming from: ILF, [facility] transfer  I have personally briefly reviewed patient's old medical records in [place]  Chief Complaint: SOB  HPI: Travis Kelly is a 86 y.o. male with medical history significant of PSVT on metoprolol and flecanide, Mod MR.  Pt recently established with Dr. Gwenlyn Found.  Pt presents to [facility] ED with c/o SOB, peripheral edema, Over the past week or more.  Of note pt recently had UTI this past week, and apparently forgot to take his meds for 3 days or so (possibly secondary to UTI).  Pt started on Keflex for UTI.  Pt recently noted by PCP to be in PAF (new diagnosis).  Looks like they called Dr. Rosalyn Gess a couple of days ago and he ordered a monitor to see what AF burden was.   ED Course: Pt remains in AF, rate controlled.  CXR neg, BNP 1500, trops 46 and 49.  Creat 1.8 (1.7 in July 2020)  Given Lasix and sent for admission.   Review of Systems: As per HPI, otherwise all review of systems negative.  Past Medical History:  Diagnosis Date  . Age-related macular degeneration, dry, left eye   . Age-related macular degeneration, wet, right eye ([place])   . Arthritis    "right knee" (04/17/2018)  . CHF (congestive heart failure) ([place])   . GERD (gastroesophageal reflux disease)   . Heart murmur   . History of duodenal ulcer   . History of hiatal hernia   . LAFB (left anterior fascicular block)   . Mitral valve disorder    Moderate MR  . Nephrolithiasis 2013  . Pneumonia 1938; ?date   "once as a child; once as an adult" (04/17/2018)  . PSVT (paroxysmal supraventricular tachycardia) ([place]) 06/2016    Past Surgical History:  Procedure Laterality Date  . CARDIAC CATHETERIZATION    . CATARACT EXTRACTION W/ INTRAOCULAR LENS  IMPLANT, BILATERAL Bilateral 2013  . SKIN BIOPSY Left    "thigh; suspected CA; it was not"     reports that he has  quit smoking. He has never used smokeless tobacco. He reports current alcohol use of about 3.0 standard drinks of alcohol per week. He reports that he does not use drugs.  Allergies  Allergen Reactions  . Rocephin [Ceftriaxone Sodium In Dextrose] Nausea And Vomiting and Other (See Comments)     hallucinations  . Barbital Swelling, Rash and Other (See Comments)    Blue lips and swollen tongue    Family History  Problem Relation Age of Onset  . Cancer Mother   . Pneumonia Father      Prior to Admission medications   Medication Sig Start Date End Date Taking? Authorizing Provider  acetaminophen (TYLENOL) 500 MG tablet Take 1,000 mg by mouth daily as needed for headache (pain).    Yes [provider]  aspirin EC 81 MG tablet Take 81 mg by mouth daily. Swallow whole.   Yes [provider]  B Complex-C (SUPER B COMPLEX PO) Take 1 tablet by mouth every evening.   Yes [provider]  Calcium Carb-Cholecalciferol (CALCIUM 500 +D PO) Take 500 mg by mouth every evening.   Yes [provider]  cephALEXin (KEFLEX) 500 MG capsule Take 500 mg by mouth 2 (two) times daily.    Yes [provider]  cholecalciferol (VITAMIN D3) 25 MCG (1000 UNIT) tablet Take 1,000 Units  by mouth every evening.    Yes [provider]  finasteride (PROSCAR) 5 MG tablet Take 5 mg by mouth daily. 07/25/13  Yes [provider]  flecainide (TAMBOCOR) 50 MG tablet Take 1 tablet (50 mg total) by mouth every 12 (twelve) hours. 11/20/18  Yes Kilroy, Luke K, PA-C  fluticasone (FLONASE) 50 MCG/ACT nasal spray Place 1-2 sprays into both nostrils daily as needed for allergies or rhinitis.   Yes [provider]  furosemide (LASIX) 20 MG tablet TAKE 1 TABLET EVERY DAY Patient taking differently: Take 20 mg by mouth See admin instructions. Take one tablet (20 mg) by mouth daily Monday thru Saturday (skip Sunday) 06/08/18  Yes Lorretta Harp, MD  Krill Oil 500 MG CAPS  Take 500 mg by mouth every evening.   Yes [provider]  Lutein 20 MG CAPS Take 20 mg by mouth every evening.    Yes [provider]  metoprolol tartrate (LOPRESSOR) 25 MG tablet TAKE 1 TABLET BY MOUTH THREE TIMES A DAY Patient taking differently: 12.5 mg 2 (two) times daily.  01/15/19  Yes Kilroy, Luke K, PA-C  Multiple Vitamins-Minerals (OCUVITE EYE HEALTH FORMULA) CAPS Take 1 capsule by mouth every evening.    Yes [provider]  naproxen sodium (ALEVE) 220 MG tablet Take 220 mg by mouth 2 (two) times daily as needed (pain).   Yes [provider]  polyvinyl alcohol (ARTIFICIAL TEARS) 1.4 % ophthalmic solution Place 1 drop into both eyes daily as needed for dry eyes.   Yes [provider]  senna (SENOKOT) 8.6 MG TABS tablet Take 1 tablet by mouth every evening.   Yes [provider]  vitamin C (ASCORBIC ACID) 500 MG tablet Take 500 mg by mouth every evening.    Yes [provider]    Physical Exam: Vitals:   09/22/19 1740 09/22/19 1747 09/22/19 1755 09/22/19 1850  BP:    119/76  Pulse: (!) 103 98 (!) 101 (!) 101  Resp: 17 18 (!) 26 20  Temp:    98.6 F (37 C)  TempSrc:    Oral  SpO2: 100% 100% 100% 100%  Weight:      Height:        Constitutional: NAD, calm, comfortable Eyes: PERRL, lids and conjunctivae normal ENMT: Mucous membranes are moist. Posterior pharynx clear of any exudate or lesions.Normal dentition.  Neck: normal, supple, no masses, no thyromegaly Respiratory: clear to auscultation bilaterally, no wheezing, no crackles. Normal respiratory effort. No accessory muscle use.  Cardiovascular: IRR, IRR, no murmurs / rubs / gallops. 2-3+ BLE edema. 2+ pedal pulses. No carotid bruits.  Abdomen: no tenderness, no masses palpated. No hepatosplenomegaly. Bowel sounds positive.  Musculoskeletal: no clubbing / cyanosis. No joint deformity upper and lower extremities. Good ROM, no contractures. Normal muscle tone.    Skin: no rashes, lesions, ulcers. No induration Neurologic: CN 2-12 grossly intact. Sensation intact, DTR normal. Strength 5/5 in all 4.  Psychiatric: Normal judgment and insight. Alert and oriented x 3. Normal mood.    Labs on Admission: I have personally reviewed following labs and imaging studies  CBC: Recent Labs  Lab 09/22/19 1233  WBC 16.8*  NEUTROABS 14.8*  HGB 12.8*  HCT 40.0  MCV 105.5*  PLT 967   Basic Metabolic Panel: Recent Labs  Lab 09/22/19 1233  NA 136  K 4.8  CL 100  CO2 18*  GLUCOSE 122*  BUN 39*  CREATININE 1.85*  CALCIUM 8.7*   GFR: Estimated  Creatinine Clearance: 23.3 mL/min (A) (by C-G formula based on SCr of 1.85 mg/dL (H)). Liver Function Tests: No results for input(s): AST, ALT, ALKPHOS, BILITOT, PROT, ALBUMIN in the last 168 hours. No results for input(s): LIPASE, AMYLASE in the last 168 hours. No results for input(s): AMMONIA in the last 168 hours. Coagulation Profile: No results for input(s): INR, PROTIME in the last 168 hours. Cardiac Enzymes: No results for input(s): CKTOTAL, CKMB, CKMBINDEX, TROPONINI in the last 168 hours. BNP (last 3 results) Recent Labs    10/03/18 1459  PROBNP 4,125*   HbA1C: No results for input(s): HGBA1C in the last 72 hours. CBG: No results for input(s): GLUCAP in the last 168 hours. Lipid Profile: No results for input(s): CHOL, HDL, LDLCALC, TRIG, CHOLHDL, LDLDIRECT in the last 72 hours. Thyroid Function Tests: No results for input(s): TSH, T4TOTAL, FREET4, T3FREE, THYROIDAB in the last 72 hours. Anemia Panel: No results for input(s): VITAMINB12, FOLATE, FERRITIN, TIBC, IRON, RETICCTPCT in the last 72 hours. Urine analysis:    Component Value Date/Time   COLORURINE AMBER (A) 09/22/2019 1408   APPEARANCEUR CLOUDY (A) 09/22/2019 1408   LABSPEC >1.030 (H) 09/22/2019 1408   PHURINE 5.5 09/22/2019 1408   GLUCOSEU NEGATIVE 09/22/2019 1408   HGBUR NEGATIVE 09/22/2019 1408   BILIRUBINUR NEGATIVE  09/22/2019 1408   KETONESUR NEGATIVE 09/22/2019 1408   PROTEINUR 100 (A) 09/22/2019 1408   NITRITE NEGATIVE 09/22/2019 1408   LEUKOCYTESUR NEGATIVE 09/22/2019 1408    Radiological Exams on Admission: DG Chest Portable 1 View  Result Date: 09/22/2019 CLINICAL DATA:  Shortness of breath, decreased urine output EXAM: PORTABLE CHEST 1 VIEW COMPARISON:  04/16/2018 FINDINGS: Stable cardiomegaly without CHF or pneumonia. Negative for edema, effusion or pneumothorax. Trachea midline. Aorta atherosclerotic. IMPRESSION: Stable cardiomegaly without acute chest process or interval change. Aortic Atherosclerosis (ICD10-I70.0). Electronically Signed   By: Jerilynn Mages.  Shick M.D.   On: 09/22/2019 13:05    EKG: Independently reviewed.  Assessment/Plan Principal Problem:   Acute on chronic diastolic CHF (congestive heart failure) (HCC) Active Problems:   History of PSVT    Moderate mitral regurgitation   Chronic renal insufficiency, stage 3 (moderate)   PAF (paroxysmal atrial fibrillation) ([place])    1. Acute on chronic diastolic CHF - 1. Suspect new onset A.Fib may be playing a role here, also note h/o mod MR. 2. CHF pathway 3. Tele monitor 4. 2d echo in AM 5. Lasix 40mg  IV daily (first dose in ED) 6. Strict intake and output 7. Daily BMP 8. Cards eval in AM 2. PAF - 1. New onset 2. Remains in AF but rate controlled at the moment 3. Will continue the metoprolol and flecanide he takes chronically for PSVT 4. Will hold off on starting any anticoagulation in this 86 yo who walks with a walker and instead defer to cards in AM 5. Cards eval in AM 3. CKD 3 - 1. Chronic and appears stable from last year 2. Repeat BMP in AM with diuresis 4. H/o PSVT - 1. Cont meds as above 5. Recent UTI - 1. Urine looks fairly clean today 2. Continue Keflex course  DVT prophylaxis: Lovenox Code Status: DNR/DNI - confirmed with daughter at bedside Family Communication: Daughter is at bedside Disposition Plan: Back to  facility after CHF diuresed, AF addressed by cards Consults called: Message sent to P.Trent for cards eval in AM Admission status: Admit to inpatient  Severity of Illness: The appropriate patient status for this patient is INPATIENT. Inpatient status is judged to  be reasonable and necessary in order to provide the required intensity of service to ensure the patient's safety. The patient's presenting symptoms, physical exam findings, and initial radiographic and laboratory data in the context of their chronic comorbidities is felt to place them at high risk for further clinical deterioration. Furthermore, it is not anticipated that the patient will be medically stable for discharge from the hospital within 2 midnights of admission. The following factors support the patient status of inpatient.   IP status due to new onset A.Fib causing decompensation of CHF.  Diuresis in pt with CKD stage 3.  * I certify that at the point of admission it is my clinical judgment that the patient will require inpatient hospital care spanning beyond 2 midnights from the point of admission due to high intensity of service, high risk for further deterioration and high frequency of surveillance required.*    Ainhoa Rallo M. DO [facility]  How to contact the [facility] Attending or Consulting provider Cross or covering provider during after hours [place], for this patient?  1. Check the care team in [facility] and look for a) attending/consulting TRH provider listed and b) the [facility] team listed 2. Log into [email or web address]  Amion Physician Scheduling and messaging for groups and whole hospitals  On call and physician scheduling software for group practices, residents, hospitalists and other medical providers for call, clinic, rotation and shift schedules. OnCall Enterprise is a hospital-wide system for scheduling doctors and paging doctors on call. EasyPlot is for scientific plotting and data analysis.  [email or web address]  and use  [place]'s universal password to access. If you do not have the password, please contact the hospital operator.  3. Locate the [facility] provider you are looking for under [facility] and page to a number that you can be directly reached. 4. If you still have difficulty reaching the provider, please page the [facility] (Director on Call) for the Hospitalists listed on amion for assistance.  09/22/2019, 8:27 PM

## 2019-09-22 NOTE — ED Triage Notes (Signed)
Pt arrives with family with reports of decreased urinary output X 2 days, does self cath at home. Pt also c/o SOB X2-3 days.

## 2019-09-23 ENCOUNTER — Other Ambulatory Visit (HOSPITAL_COMMUNITY): Payer: Medicare HMO

## 2019-09-23 LAB — BASIC METABOLIC PANEL WITH GFR
Anion gap: 9 (ref 5–15)
BUN: 40 mg/dL — ABNORMAL HIGH (ref 8–23)
CO2: 26 mmol/L (ref 22–32)
Calcium: 8.4 mg/dL — ABNORMAL LOW (ref 8.9–10.3)
Chloride: 102 mmol/L (ref 98–111)
Creatinine, Ser: 1.76 mg/dL — ABNORMAL HIGH (ref 0.61–1.24)
GFR calc Af Amer: 37 mL/min — ABNORMAL LOW
GFR calc non Af Amer: 32 mL/min — ABNORMAL LOW
Glucose, Bld: 105 mg/dL — ABNORMAL HIGH (ref 70–99)
Potassium: 3.7 mmol/L (ref 3.5–5.1)
Sodium: 137 mmol/L (ref 135–145)

## 2019-09-23 MED ORDER — APIXABAN 2.5 MG PO TABS
2.5000 mg | ORAL_TABLET | Freq: Two times a day (BID) | ORAL | Status: DC
  Administered 2019-09-23 – 2019-09-26 (×7): 2.5 mg via ORAL
  Filled 2019-09-23 (×8): qty 1

## 2019-09-23 MED ORDER — ENOXAPARIN SODIUM 30 MG/0.3ML ~~LOC~~ SOLN: 30.0000 mg | SUBCUTANEOUS | Status: DC

## 2019-09-23 MED ORDER — FUROSEMIDE 10 MG/ML IJ SOLN
40.0000 mg | Freq: Two times a day (BID) | INTRAMUSCULAR | Status: DC
  Administered 2019-09-23: 40 mg via INTRAVENOUS
  Filled 2019-09-23: qty 4

## 2019-09-23 NOTE — Discharge Instructions (Signed)

## 2019-09-23 NOTE — Progress Notes (Signed)
PROGRESS NOTE        PATIENT DETAILS Name: Travis Kelly Age: 86 y.o. Sex: male Date of Birth: June 09, 1922 Admit Date: 09/22/2019 Admitting Physician Etta Quill, DO ACZ:[id number], Nicki Reaper, MD  Brief Narrative: Patient is a 86 y.o. male with history of PSVT, PAF, moderate MR, HLD, chronic diastolic heart failure, chronic neurogenic bladder (in/out catheterization at home at least 3 times daily)-who presented to the hospital with shortness of breath worsening lower extremity edema for approximately 2 days-found to have acute hypoxic respiratory failure secondary to decompensated diastolic heart failure and admitted to the hospitalist service.  See below for further details.   Significant events: 6/13>> admit to [facility] for hypoxia from acute on chronic diastolic heart failure  Significant studies: 09/22/2019>> chest x-ray: Stable cardiomegaly without acute chest process/interval change. 04/17/2018>> TTE: EF 55-60%, moderate MR, moderate pulmonary hypertension  Antimicrobial therapy: Keflex: 6/9>>  Microbiology data: 6/13: Urine culture>> pending  Procedures : None  Consults: Cardiology  DVT Prophylaxis : enoxaparin (LOVENOX) injection 30 mg Start: 09/23/19 1945  Subjective: Feeling better-transitioned off oxygen this morning (was on 4 L when he first presented).  But does get short of breath when he moves around in bed.  Assessment/Plan: Acute hypoxic respiratory failure: Secondary to decompensated diastolic heart failure-apparently was on 4 L of oxygen when he first presented-currently on room air.  Continue supportive care.  Acute on chronic diastolic heart failure: Hypoxia has improved-still with significant lower extremity edema-continue IV Lasix.  Follow weights/intake output/electrolytes.  Persistent atrial fibrillation: Review of outpatient notes recently-indicates that patient has had A. fib for more than a few days.  Currently rate controlled with  metoprolol.  Await cardiology opinion-regarding whether this patient still needs to be on flecainide which was started for PSVT.  Given advanced age-fall risk-not a great candidate for long-term anticoagulation-but await cardiology opinion as well.  CKD stage IIIb: Creatinine not far from usual baseline-follow periodically.  History of PSVT: Continue telemetry monitoring-on flecainide-?  Continue-await cardiology evaluation  Recent UTI: Started on Keflex on 6/9 by PCP-7-day course plan-continue.  Urine culture pending.  History of moderate mitral regurgitation: Per prior outpatient notes-patient not interested in surgical correction.  Moderate pulmonary hypertension: Likely secondary to MR/diastolic heart failure-further work-up will not change management or outcome.  History of ?  Neurogenic bladder: Per patient-does in and out catheterization at least 3 times daily at home.  He does urinate/dribble as well but apparently is incontinent.  While on intravenous diuretics-check bladder scans to ensure no significant retention.  Deconditioning/debility: Apparently walks with the help of a walker at baseline-await PT/OT eval.  Diet: Diet Order            Diet Heart Room service appropriate? Yes; Fluid consistency: Thin  Diet effective now                  Code Status:  DNR  Family Communication: Spoke with daughter over phone  Disposition Plan: Status is: Inpatient  Remains inpatient appropriate because:Inpatient level of care appropriate due to severity of illness  Dispo: The patient is from: Home              Anticipated d/c is to: Home              Anticipated d/c date is: 2 days  Patient currently is not medically stable to d/c.  Barriers to Discharge: Hypoxia with decompensated heart failure requiring intravenous Lasix and oxygen supplementation.  Antimicrobial agents: Anti-infectives (From admission, onward)   Start     Dose/Rate Route Frequency Ordered  Stop   09/22/19 2200  cephALEXin (KEFLEX) capsule 500 mg     Discontinue     500 mg Oral 2 times daily 09/22/19 2034 09/26/19 0959   09/22/19 1530  ciprofloxacin (CIPRO) IVPB 200 mg  Status:  Discontinued        200 mg 100 mL/hr over 60 Minutes Intravenous  Once 09/22/19 1520 09/22/19 2005       Time spent: 25 minutes-Greater than 50% of this time was spent in counseling, explanation of diagnosis, planning of further management, and coordination of care.  MEDICATIONS: Scheduled Meds: . vitamin C  500 mg Oral QPM  . aspirin EC  81 mg Oral Daily  . calcium-vitamin D  1 tablet Oral QPM  . cephALEXin  500 mg Oral BID  . cholecalciferol  1,000 Units Oral QPM  . enoxaparin (LOVENOX) injection  30 mg Subcutaneous Q24H  . finasteride  5 mg Oral Daily  . flecainide  50 mg Oral Q12H  . furosemide  40 mg Intravenous BID  . metoprolol tartrate  12.5 mg Oral BID  . multivitamin  1 tablet Oral Daily  . senna  1 tablet Oral QPM  . sodium chloride flush  3 mL Intravenous Q12H   Continuous Infusions: . sodium chloride     PRN Meds:.sodium chloride, acetaminophen, fluticasone, ondansetron (ZOFRAN) IV, polyvinyl alcohol, sodium chloride flush   PHYSICAL EXAM: Vital signs: Vitals:   09/22/19 2331 09/23/19 0424 09/23/19 0739 09/23/19 0800  BP: 115/71 101/75 104/82   Pulse: (!) 106 91 95 96  Resp: 17 18 16    Temp: 98.2 F (36.8 C) 97.6 F (36.4 C) (!) 97.4 F (36.3 C)   TempSrc: Oral Oral Tympanic   SpO2: 100% 100% 99%   Weight:  80 kg    Height:       Filed Weights   09/22/19 1209 09/23/19 0424  Weight: 77.1 kg 80 kg   Body mass index is 28.47 kg/m.   Gen Exam:Alert awake-not in any distress HEENT:atraumatic, normocephalic Chest: B/L clear to auscultation anteriorly CVS:S1S2 irregular Abdomen:soft non tender, non distended Extremities:++ edema Neurology: Non focal Skin: no rash  I have personally reviewed following labs and imaging studies  LABORATORY  DATA: CBC: Recent Labs  Lab 09/22/19 1233  WBC 16.8*  NEUTROABS 14.8*  HGB 12.8*  HCT 40.0  MCV 105.5*  PLT 096    Basic Metabolic Panel: Recent Labs  Lab 09/22/19 1233 09/23/19 0447  NA 136 137  K 4.8 3.7  CL 100 102  CO2 18* 26  GLUCOSE 122* 105*  BUN 39* 40*  CREATININE 1.85* 1.76*  CALCIUM 8.7* 8.4*    GFR: Estimated Creatinine Clearance: 25 mL/min (A) (by C-G formula based on SCr of 1.76 mg/dL (H)).  Liver Function Tests: No results for input(s): AST, ALT, ALKPHOS, BILITOT, PROT, ALBUMIN in the last 168 hours. No results for input(s): LIPASE, AMYLASE in the last 168 hours. No results for input(s): AMMONIA in the last 168 hours.  Coagulation Profile: No results for input(s): INR, PROTIME in the last 168 hours.  Cardiac Enzymes: No results for input(s): CKTOTAL, CKMB, CKMBINDEX, TROPONINI in the last 168 hours.  BNP (last 3 results) Recent Labs    10/03/18 1459  PROBNP 4,125*  Lipid Profile: No results for input(s): CHOL, HDL, LDLCALC, TRIG, CHOLHDL, LDLDIRECT in the last 72 hours.  Thyroid Function Tests: No results for input(s): TSH, T4TOTAL, FREET4, T3FREE, THYROIDAB in the last 72 hours.  Anemia Panel: No results for input(s): VITAMINB12, FOLATE, FERRITIN, TIBC, IRON, RETICCTPCT in the last 72 hours.  Urine analysis:    Component Value Date/Time   COLORURINE AMBER (A) 09/22/2019 1408   APPEARANCEUR CLOUDY (A) 09/22/2019 1408   LABSPEC >1.030 (H) 09/22/2019 1408   PHURINE 5.5 09/22/2019 1408   GLUCOSEU NEGATIVE 09/22/2019 1408   HGBUR NEGATIVE 09/22/2019 1408   BILIRUBINUR NEGATIVE 09/22/2019 1408   KETONESUR NEGATIVE 09/22/2019 1408   PROTEINUR 100 (A) 09/22/2019 1408   NITRITE NEGATIVE 09/22/2019 1408   LEUKOCYTESUR NEGATIVE 09/22/2019 1408    Sepsis Labs: Lactic Acid, Venous No results found for: LATICACIDVEN  MICROBIOLOGY: Recent Results (from the past 240 hour(s))  SARS Coronavirus 2 by RT PCR (hospital order, performed in  [place] hospital lab) Nasopharyngeal Nasopharyngeal Swab     Status: None   Collection Time: 09/22/19 12:33 PM   Specimen: Nasopharyngeal Swab  Result Value Ref Range Status   SARS Coronavirus 2 NEGATIVE NEGATIVE Final    Comment: (NOTE) SARS-CoV-2 target nucleic acids are NOT DETECTED.  The SARS-CoV-2 RNA is generally detectable in upper and lower respiratory specimens during the acute phase of infection. The lowest concentration of SARS-CoV-2 viral copies this assay can detect is 250 copies / mL. A negative result does not preclude SARS-CoV-2 infection and should not be used as the sole basis for treatment or other patient management decisions.  A negative result may occur with improper specimen collection / handling, submission of specimen other than nasopharyngeal swab, presence of viral mutation(s) within the areas targeted by this assay, and inadequate number of viral copies (<250 copies / mL). A negative result must be combined with clinical observations, patient history, and epidemiological information.  Fact Sheet for Patients:   StrictlyIdeas.no  Fact Sheet for Healthcare Providers: BankingDealers.co.za  This test is not yet approved or  cleared by the [place] FDA and has been authorized for detection and/or diagnosis of SARS-CoV-2 by FDA under an Emergency Use Authorization (EUA).  This EUA will remain in effect (meaning this test can be used) for the duration of the COVID-19 declaration under Section 564(b)(1) of the Act, 21 U.S.C. section 360bbb-3(b)(1), unless the authorization is terminated or revoked sooner.  Performed at [facility], [address] [place], [place] [address]     RADIOLOGY STUDIES/RESULTS: DG Chest Portable 1 View  Result Date: 09/22/2019 CLINICAL DATA:  Shortness of breath, decreased urine output EXAM: PORTABLE CHEST 1 VIEW COMPARISON:  04/16/2018 FINDINGS: Stable  cardiomegaly without CHF or pneumonia. Negative for edema, effusion or pneumothorax. Trachea midline. Aorta atherosclerotic. IMPRESSION: Stable cardiomegaly without acute chest process or interval change. Aortic Atherosclerosis (ICD10-I70.0). Electronically Signed   By: Jerilynn Mages.  Shick M.D.   On: 09/22/2019 13:05     LOS: 1 day   Oren Binet, MD  [facility]    To contact the attending provider between 7A-7P or the covering provider during after hours 7P-7A, please log into the web site [email or web address] and access using universal [place] password for that web site. If you do not have the password, please call the hospital operator.  09/23/2019, 10:10 AM

## 2019-09-23 NOTE — Progress Notes (Signed)
Patient worked with PT after getting 40 IV lasix. Pt felt bad when standing and had drop in BP 62/47. Pt returned to bed and bp rechecked 94/69 which is baseline per pt. Patient aware that he will need to try again later. Pt resting with call bell within reach.  Will continue to monitor. '

## 2019-09-23 NOTE — Progress Notes (Signed)
Plan of care reviewed. Pt's appeared alert and oriented x 4. Denied chest pain. Daughter at bedside until 9.00pm. Pt had fair appetite. Able to eat haft of [place] sandwich. He stated he has no appetite, hasn't eaten very much since last Thursday. Denied nausea/ vomiting. Abdominal soft, obese, positive bowel sound.   On 6 LPM of O2 NCL, No shortness of breath, RR 16- 20, Atrial fibrillation on EKG monitor, MD aware, under controlled HR  89- 110 BPM, . BP within normal limits.   In and out catheterization got urine 500 ml, yellow and clear, specimen sent to lab for urine culture.   No immediate distress to night. We continue to monitor.  Kennyth Lose, RN

## 2019-09-23 NOTE — Progress Notes (Signed)
Bladder scanned patient with 301-310 ml volume post 50 ml void at 1600. Order to I/O cath per [facility] Guidelines. Patient states he I/O caths himself every 6 hours at home and sometimes in between. Sent page to MD to make aware. Report given to PM RN to reassess at 2000 bladder scan. Pt resting with call bell within reach.  Will continue to monitor.

## 2019-09-23 NOTE — TOC Benefit Eligibility Note (Signed)
Transition of Care [facility]) Benefit Eligibility Note    Patient Details  Name: Travis Kelly MRN: [id number] Date of Birth: [birth year]   Medication/Dose: Eliquis 5mg  bid  Covered?: Yes     Prescription Coverage Preferred Pharmacy: most major retail pharmacies  Spoke with Person/Company/Phone Number:: [employer]  Co-Pay: $45 for 30 day retail  Prior Approval: No          Delorse Lek Phone Number: 09/23/2019, 1:59 PM

## 2019-09-23 NOTE — Consult Note (Addendum)
Cardiology Consultation:   Patient ID: Acen Craun Stiggers MRN: [id number]; DOB: 10/29/1922  Admit date: 09/22/2019 Date of Consult: 09/23/2019  Primary Care Provider: [name], MD [facility] Cardiologist: Quay Burow, MD  [facility] Electrophysiologist:  Thompson Grayer, MD    Patient Profile:   Koy Lamp Spatz is a 86 y.o. male with a hx of PSVT, chronic diastolic CHF, urinary retention/neurogenic bladder (patient self catheterizes), LBBB, moderate MR (opting for noninvasive tx), dyslipidemia who is being seen today for the evaluation of CHF and afib at the request of Dr. Sloan Leiter.  History of Present Illness:   Mr. Poyer is followed by Dr. Gwenlyn Found for the above cardiac issues. He has history of recurrent PSVT for which he takes BB. The patient was seen by EP 04/2018 during an admission for PSVT and the patient opted for Flecanide which improved PSVT. Echo in 04/2018 showed normal EF with moderate MR and moderate pulm HTN. Options were discussed with the patient but he preferred no invasive procedures for MV.  He was last seen in the office 07/2019 by Dr. Gwenlyn Found and was overall doing well, in sinus rhythm.   The patient presented to [facility] ED 09/22/19 with urinary retention, sob, and increased leg swelling. SOB had started a few days prior. He also noted low urine output. He was seen by his PCP 6/8 who started him on abx for UTI. And also noted the patient was in and out of afib and a ZIO was ordered. LLE edema worsened despite taking lasix 20 mg daily. Reported 4-5 lbs weight gain. No chest pain. He lives at an independent living and uses a walker to get around. He tries to ambulate but this is limited by arthritic knee pain.   Work-up in the ED showed BP 109/63, pulse 96, afebrile, RR 20. Labs showed WBC 16.8, Hgb 12.8, CO2, 18, gucpse 122, creatinine 1.85, anion gap 18. UA with UTI. HS troponin 46. CXR with stable cardiomegaly without acute process. BNP 1,552. EKG showed new onset afib with  chronic LBBB, rate 110 bpm. Patient was transferred to [facility] and admitted for further work-up.    Past Medical History:  Diagnosis Date  . Age-related macular degeneration, dry, left eye   . Age-related macular degeneration, wet, right eye ([place])   . Arthritis    "right knee" (04/17/2018)  . CHF (congestive heart failure) ([place])   . GERD (gastroesophageal reflux disease)   . Heart murmur   . History of duodenal ulcer   . History of hiatal hernia   . LAFB (left anterior fascicular block)   . Mitral valve disorder    Moderate MR  . Nephrolithiasis 2013  . Pneumonia 1938; ?date   "once as a child; once as an adult" (04/17/2018)  . PSVT (paroxysmal supraventricular tachycardia) ([place]) 06/2016    Past Surgical History:  Procedure Laterality Date  . CARDIAC CATHETERIZATION    . CATARACT EXTRACTION W/ INTRAOCULAR LENS  IMPLANT, BILATERAL Bilateral 2013  . SKIN BIOPSY Left    "thigh; suspected CA; it was not"     Home Medications:  Prior to Admission medications   Medication Sig Start Date End Date Taking? Authorizing Provider  acetaminophen (TYLENOL) 500 MG tablet Take 1,000 mg by mouth daily as needed for headache (pain).    Yes [provider]  aspirin EC 81 MG tablet Take 81 mg by mouth daily. Swallow whole.   Yes [provider]  B Complex-C (SUPER B COMPLEX PO) Take 1 tablet by mouth  every evening.   Yes [provider]  Calcium Carb-Cholecalciferol (CALCIUM 500 +D PO) Take 500 mg by mouth every evening.   Yes [provider]  cephALEXin (KEFLEX) 500 MG capsule Take 500 mg by mouth 2 (two) times daily.    Yes [provider]  cholecalciferol (VITAMIN D3) 25 MCG (1000 UNIT) tablet Take 1,000 Units by mouth every evening.    Yes [provider]  finasteride (PROSCAR) 5 MG tablet Take 5 mg by mouth daily. 07/25/13  Yes [provider]  flecainide (TAMBOCOR) 50 MG tablet Take 1 tablet (50 mg total) by mouth every 12 (twelve)  hours. 11/20/18  Yes Kilroy, Luke K, PA-C  fluticasone (FLONASE) 50 MCG/ACT nasal spray Place 1-2 sprays into both nostrils daily as needed for allergies or rhinitis.   Yes [provider]  furosemide (LASIX) 20 MG tablet TAKE 1 TABLET EVERY DAY Patient taking differently: Take 20 mg by mouth See admin instructions. Take one tablet (20 mg) by mouth daily Monday thru Saturday (skip Sunday) 06/08/18  Yes Lorretta Harp, MD  Krill Oil 500 MG CAPS Take 500 mg by mouth every evening.   Yes [provider]  Lutein 20 MG CAPS Take 20 mg by mouth every evening.    Yes [provider]  metoprolol tartrate (LOPRESSOR) 25 MG tablet TAKE 1 TABLET BY MOUTH THREE TIMES A DAY Patient taking differently: 12.5 mg 2 (two) times daily.  01/15/19  Yes Kilroy, Luke K, PA-C  Multiple Vitamins-Minerals (OCUVITE EYE HEALTH FORMULA) CAPS Take 1 capsule by mouth every evening.    Yes [provider]  naproxen sodium (ALEVE) 220 MG tablet Take 220 mg by mouth 2 (two) times daily as needed (pain).   Yes [provider]  polyvinyl alcohol (ARTIFICIAL TEARS) 1.4 % ophthalmic solution Place 1 drop into both eyes daily as needed for dry eyes.   Yes [provider]  senna (SENOKOT) 8.6 MG TABS tablet Take 1 tablet by mouth every evening.   Yes [provider]  vitamin C (ASCORBIC ACID) 500 MG tablet Take 500 mg by mouth every evening.    Yes [provider]    Inpatient Medications: Scheduled Meds: . vitamin C  500 mg Oral QPM  . aspirin EC  81 mg Oral Daily  . calcium-vitamin D  1 tablet Oral QPM  . cephALEXin  500 mg Oral BID  . cholecalciferol  1,000 Units Oral QPM  . enoxaparin (LOVENOX) injection  30 mg Subcutaneous Q24H  . finasteride  5 mg Oral Daily  . flecainide  50 mg Oral Q12H  . furosemide  40 mg Intravenous BID  . metoprolol tartrate  12.5 mg Oral BID  . multivitamin  1 tablet Oral Daily  . senna  1 tablet Oral QPM  . sodium chloride  flush  3 mL Intravenous Q12H   Continuous Infusions: . sodium chloride     PRN Meds: sodium chloride, acetaminophen, fluticasone, ondansetron (ZOFRAN) IV, polyvinyl alcohol, sodium chloride flush  Allergies:    Allergies  Allergen Reactions  . Rocephin [Ceftriaxone Sodium In Dextrose] Nausea And Vomiting and Other (See Comments)     Hallucinations  Tolerated Keflex course 09/2019  . Barbital Swelling, Rash and Other (See Comments)    Blue lips and swollen tongue    Social History:   Social History   Socioeconomic History  . Marital status: Married    Spouse name: Not on file  . Number of children: Not on file  .  Years of education: Not on file  . Highest education level: Not on file  Occupational History  . Not on file  Tobacco Use  . Smoking status: Former Research scientist (life sciences)  . Smokeless tobacco: Never Used  . Tobacco comment: 04/17/2018 "quit smoking when I was 14"  Vaping Use  . Vaping Use: Never used  Substance and Sexual Activity  . Alcohol use: Yes    Alcohol/week: 3.0 standard drinks    Types: 3 Glasses of wine per week  . Drug use: Never  . Sexual activity: Not on file  Other Topics Concern  . Not on file  Social History Narrative  . Not on file   Social Determinants of Health   Financial Resource Strain:   . Difficulty of Paying Living Expenses:   Food Insecurity:   . Worried About Charity fundraiser in the Last Year:   . Arboriculturist in the Last Year:   Transportation Needs:   . Film/video editor (Medical):   Marland Kitchen Lack of Transportation (Non-Medical):   Physical Activity:   . Days of Exercise per Week:   . Minutes of Exercise per Session:   Stress:   . Feeling of Stress :   Social Connections:   . Frequency of Communication with Friends and Family:   . Frequency of Social Gatherings with Friends and Family:   . Attends Religious Services:   . Active Member of Clubs or Organizations:   . Attends Archivist Meetings:   Marland Kitchen Marital Status:     Intimate Partner Violence:   . Fear of Current or Ex-Partner:   . Emotionally Abused:   Marland Kitchen Physically Abused:   . Sexually Abused:     Family History:   Family History  Problem Relation Age of Onset  . Cancer Mother   . Pneumonia Father      ROS:  Please see the history of present illness.  All other ROS reviewed and negative.     Physical Exam/Data:   Vitals:   09/22/19 2331 09/23/19 0424 09/23/19 0739 09/23/19 0800  BP: 115/71 101/75 104/82   Pulse: (!) 106 91 95 96  Resp: 17 18 16    Temp: 98.2 F (36.8 C) 97.6 F (36.4 C) (!) 97.4 F (36.3 C)   TempSrc: Oral Oral Tympanic   SpO2: 100% 100% 99%   Weight:  80 kg    Height:        Intake/Output Summary (Last 24 hours) at 09/23/2019 1015 Last data filed at 09/22/2019 2300 Gross per 24 hour  Intake --  Output 700 ml  Net -700 ml   Last 3 Weights 09/23/2019 09/22/2019 08/06/2019  Weight (lbs) 176 lb 5.9 oz 170 lb 166 lb  Weight (kg) 80 kg 77.111 kg 75.297 kg     Body mass index is 28.47 kg/m.  General:  Well nourished, well developed, in no acute distress HEENT: normal Lymph: no adenopathy Neck: mild JVD Endocrine:  No thryomegaly Vascular: No carotid bruits; FA pulses 2+ bilaterally without bruits  Cardiac:  normal S1, S2; Irreg Irreg; no murmur  Lungs: diminished at bases Abd: soft, nontender, no hepatomegaly  Ext: 2+ LL edema Musculoskeletal:  No deformities, BUE and BLE strength normal and equal Skin: warm and dry  Neuro:  CNs 2-12 intact, no focal abnormalities noted Psych:  Normal affect   EKG:  The EKG was personally reviewed and demonstrates:  Afib, HR 110 bpm, LBBB, LAD Telemetry:  Telemetry was personally reviewed and demonstrates:  Afib HR 90s  Relevant CV Studies:  Echo 04/18/19 Study Conclusions   - Left ventricle: The cavity size was normal. There was moderate  focal basal hypertrophy. Systolic function was normal. The  estimated ejection fraction was in the range of 55% to 60%. Wall   motion was normal; there were no regional wall motion  abnormalities. There was fusion of early and atrial contributions  to ventricular filling. The study is not technically sufficient  to allow evaluation of LV diastolic function. Doppler parameters  are consistent with high ventricular filling pressure.  - Aortic valve: There was mild stenosis. There was mild  regurgitation. Valve area (VTI): 1.45 cm^2. Valve area (Vmean):  1.39 cm^2.  - Aorta: Ascending aortic diameter: 38 mm (S).  - Ascending aorta: The ascending aorta was mildly dilated.  - Mitral valve: Calcified annulus. There was moderate regurgitation  directed eccentrically, anteriorly, and toward the septum.  - Left atrium: The atrium was severely dilated.  - Right ventricle: Systolic function was mildly to moderately  reduced.  - Tricuspid valve: There was mild regurgitation.  - Pulmonic valve: There was mild regurgitation.  - Pulmonary arteries: PA peak pressure: 50 mm Hg (S).   Impressions:   - The right ventricular systolic pressure was increased consistent  with moderate pulmonary hypertension.   Laboratory Data:  High Sensitivity Troponin:   Recent Labs  Lab 09/22/19 1223 09/22/19 1416  TROPONINIHS 46* 49*     Chemistry Recent Labs  Lab 09/22/19 1233 09/23/19 0447  NA 136 137  K 4.8 3.7  CL 100 102  CO2 18* 26  GLUCOSE 122* 105*  BUN 39* 40*  CREATININE 1.85* 1.76*  CALCIUM 8.7* 8.4*  GFRNONAA 30* 32*  GFRAA 35* 37*  ANIONGAP 18* 9    No results for input(s): PROT, ALBUMIN, AST, ALT, ALKPHOS, BILITOT in the last 168 hours. Hematology Recent Labs  Lab 09/22/19 1233  WBC 16.8*  RBC 3.79*  HGB 12.8*  HCT 40.0  MCV 105.5*  MCH 33.8  MCHC 32.0  RDW 14.6  PLT 171   BNP Recent Labs  Lab 09/22/19 1223  BNP 1,552.3*    DDimer No results for input(s): DDIMER in the last 168 hours.   Radiology/Studies:  DG Chest Portable 1 View  Result Date: 09/22/2019 CLINICAL  DATA:  Shortness of breath, decreased urine output EXAM: PORTABLE CHEST 1 VIEW COMPARISON:  04/16/2018 FINDINGS: Stable cardiomegaly without CHF or pneumonia. Negative for edema, effusion or pneumothorax. Trachea midline. Aorta atherosclerotic. IMPRESSION: Stable cardiomegaly without acute chest process or interval change. Aortic Atherosclerosis (ICD10-I70.0). Electronically Signed   By: Jerilynn Mages.  Shick M.D.   On: 09/22/2019 13:05     Assessment and Plan:   Acute on chronic diastolic CHF - presented with LLE, sob, urinary retention found to have new onset Afib. BNP up to 1,552. HS troponin mildly elevated with flat trend - patient takes lasix 20 mg daily - IV lasix 40 mg BID - patient put out 770mL overnight. Suspect this is incomplete - weights do not appear accurate. Weight today 176lbs. Baseline reported around 165lb. - Echo from 04/2018 showed EF 55-60%, moderate MR, moderate pulm HTN. Can repeat - Metoprolol 12.5mg  BID - continue with diuresis - monitor strict I/Os, daily weights, and creatinine with diuresis  New onset Afib with history of PSVT on flecainide - rates controlled in the 90s - A/c held in the setting of frail elderly who is possible fall risk - CHADSVASC  = 3 (age x 2,  CHF). Would likely qualify for low dose Eliquis but will discuss with MD - currently on aspirin - with h/o of PSVT on flecainide will discuss options with MD.   CKD stage 3 - creatinine baseline around 1.3 - creatinine 1.85>1.76  UTI/Neurogenic bladder - per IM  Moderate MR - No plans for invasive procedure  For questions or updates, please contact [facility] Please consult [email or web address] for contact info under    Signed, Cadence Ninfa Meeker, PA-C  09/23/2019 10:15 AM   Personally seen and examined. Agree with above.   Pleasant comfortable in bed no chest pain no shortness of breath daughter at bedside.  She is married to Nanetta Batty, APP with Dr. Velora Heckler for 25 years.  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  Currently atrial fibrillation heart rates mostly below 100  Echocardiogram-moderate MR, severely dilated left atrium, normal EF  Assessment and plan:  Paroxysmal atrial fibrillation -At home he is taking flecainide 50 mg twice a day.  In review of Dr. Jackalyn Lombard note from electrophysiology previously, given his underlying conduction disorder, would not increase flecainide dose. -It is possible that he is having paroxysmal atrial fibrillation at home as well.  Last week he did miss 3 doses of flecainide. -We had lengthy discussion with he and his family regarding anticoagulation.  We mutually decided to try anticoagulation, Eliquis at 2.5 mg twice a day.  Look for any signs of bleeding.  One major concern would be urinary bleeding given his in and out catheterizations.  They are willing to proceed.  Obviously if bleeding does occur, we can always stop this medication. -Severely dilated left atrium certainly playing a role.  Acute diastolic heart failure -BNP was elevated 1500.  Continue with mild diuresis today.  Back to p.o. likely tomorrow.  CKD 3 -Creatinine is down from 1.8 down to 1.7 baseline has traditionally been around 1.3.  Moderate mitral gravitation -No plans for invasive procedure.  Neurogenic bladder -In and out catheterizations.  Candee Furbish, MD

## 2019-09-23 NOTE — Evaluation (Signed)
Physical Therapy Evaluation Patient Details Name: Travis Kelly MRN: [id number] DOB: [birth year] Today's Date: 09/23/2019   History of Present Illness  86 yo male with onset of volume overload was admitted with CHF, UTI and has been in a-fib.  Diuresed now and working toward balance of BP and volume.  PMHx:  L BBB, PSVT, CHF, OA knees, CKD3, mitral regurg, macular degeneration, Atherosclerosis, cardiomegaly, neurogenic bladder  Clinical Impression  Pt was seen for initiation of mobility and noted his strength, balance and tolerance for mobility with BP were all down.  He is expecting to get to his home eventually, but realizes, as does family, that the weakness and low BP are key to resolving to get home.  Follow acutely for these needs.    Follow Up Recommendations SNF    Equipment Recommendations  Rolling walker with 5" wheels    Recommendations for Other Services       Precautions / Restrictions Precautions Precautions: Fall;Other (comment) (monitor BP) Precaution Comments: orthostatics  Restrictions Weight Bearing Restrictions: No      Mobility  Bed Mobility Overal bed mobility: Needs Assistance Bed Mobility: Supine to Sit;Sit to Supine     Supine to sit: Mod assist Sit to supine: Mod assist   General bed mobility comments: mod assist and help with LE's due to boggy edema  Transfers Overall transfer level: Needs assistance Equipment used: Rolling walker (2 wheeled);1 person hand held assist Transfers: Sit to/from Stand Sit to Stand: Mod assist         General transfer comment: mod to power up then min to maintain standing balance  Ambulation/Gait Ambulation/Gait assistance: Min assist Gait Distance (Feet): 2 Feet Assistive device: Rolling walker (2 wheeled);1 person hand held assist Gait Pattern/deviations: Step-to pattern;Shuffle;Decreased stride length;Wide base of support;Trunk flexed Gait velocity: reduced Gait velocity interpretation: <1.8 ft/sec,  indicate of risk for recurrent falls General Gait Details: pt is weak in standing and noted BP dropped to 62/47 in standing at walker  Stairs            Wheelchair Mobility    Modified Rankin (Stroke Patients Only)       Balance Overall balance assessment: Needs assistance Sitting-balance support: Feet supported Sitting balance-Leahy Scale: Fair     Standing balance support: Bilateral upper extremity supported;During functional activity Standing balance-Leahy Scale: Poor                               Pertinent Vitals/Pain Pain Assessment: No/denies pain    Home Living Family/patient expects to be discharged to:: Assisted living               Home Equipment: Walker - 2 wheels;Shower seat;Electric scooter Additional Comments: uses scooter for longer trips in facility and walker in his room    Prior Function Level of Independence: Needs assistance   Gait / Transfers Assistance Needed: RW for room and scooter for hallways  ADL's / Homemaking Assistance Needed: 7x per week has help to bathe and dress, get breakfast        Hand Dominance   Dominant Hand: Right    Extremity/Trunk Assessment   Upper Extremity Assessment Upper Extremity Assessment: Generalized weakness    Lower Extremity Assessment Lower Extremity Assessment: Generalized weakness    Cervical / Trunk Assessment Cervical / Trunk Assessment: Kyphotic  Communication   Communication: HOH  Cognition Arousal/Alertness: Awake/alert Behavior During Therapy: Anxious Overall Cognitive Status: Within Functional Limits for tasks  assessed                                 General Comments: pt is light headed in sitting and is making him anxious       General Comments General comments (skin integrity, edema, etc.): pt is feeling bad in sitting and is light headed.  Note sitting BP was 100/78, but standing even lower at 62/47.      Exercises     Assessment/Plan     PT Assessment Patient needs continued PT services  PT Problem List Decreased strength;Decreased range of motion;Decreased activity tolerance;Decreased balance;Decreased mobility;Decreased coordination;Decreased safety awareness;Cardiopulmonary status limiting activity;Decreased knowledge of precautions       PT Treatment Interventions DME instruction;Gait training;Therapeutic activities;Functional mobility training;Therapeutic exercise;Balance training;Neuromuscular re-education;Patient/family education    PT Goals (Current goals can be found in the Care Plan section)  Acute Rehab PT Goals Patient Stated Goal: to feel better and not dizzy any more PT Goal Formulation: With patient/family Time For Goal Achievement: 10/07/19 Potential to Achieve Goals: Good    Frequency Min 3X/week   Barriers to discharge Decreased caregiver support has [place] apartment with 3 hours a day care only    Co-evaluation               AM-PAC PT "6 Clicks" Mobility  Outcome Measure Help needed turning from your back to your side while in a flat bed without using bedrails?: A Little Help needed moving from lying on your back to sitting on the side of a flat bed without using bedrails?: A Little Help needed moving to and from a bed to a chair (including a wheelchair)?: A Lot Help needed standing up from a chair using your arms (e.g., wheelchair or bedside chair)?: A Lot Help needed to walk in hospital room?: A Little Help needed climbing 3-5 steps with a railing? : Total 6 Click Score: 14    End of Session Equipment Utilized During Treatment: Gait belt Activity Tolerance: Patient limited by fatigue;Treatment limited secondary to medical complications (Comment) Patient left: in bed;with family/visitor present;with nursing/sitter in room Nurse Communication: Mobility status;Other (comment) (BP drop) PT Visit Diagnosis: Unsteadiness on feet (R26.81);Muscle weakness (generalized) (M62.81);Difficulty in  walking, not elsewhere classified (R26.2);Dizziness and giddiness (R42)    Time: 7078-6754 PT Time Calculation (min) (ACUTE ONLY): 34 min   Charges:   PT Evaluation $PT Eval Moderate Complexity: 1 Mod PT Treatments $Therapeutic Activity: 8-22 mins       [name] 09/23/2019, 3:12 PM  Mee Hives, PT MS Acute Rehab Dept. Number: [place]

## 2019-09-24 ENCOUNTER — Inpatient Hospital Stay (HOSPITAL_COMMUNITY): Payer: Medicare HMO

## 2019-09-24 DIAGNOSIS — I5031 Acute diastolic (congestive) heart failure: Secondary | ICD-10-CM

## 2019-09-24 LAB — BASIC METABOLIC PANEL
Anion gap: 10 (ref 5–15)
BUN: 38 mg/dL — ABNORMAL HIGH (ref 8–23)
CO2: 27 mmol/L (ref 22–32)
Calcium: 8.5 mg/dL — ABNORMAL LOW (ref 8.9–10.3)
Chloride: 102 mmol/L (ref 98–111)
Creatinine, Ser: 1.55 mg/dL — ABNORMAL HIGH (ref 0.61–1.24)
GFR calc Af Amer: 43 mL/min — ABNORMAL LOW (ref >60)
GFR calc non Af Amer: 37 mL/min — ABNORMAL LOW (ref >60)
Glucose, Bld: 104 mg/dL — ABNORMAL HIGH (ref 70–99)
Potassium: 3.8 mmol/L (ref 3.5–5.1)
Sodium: 139 mmol/L (ref 135–145)

## 2019-09-24 LAB — CBC
HCT: 34.7 % — ABNORMAL LOW (ref 39.0–52.0)
Hemoglobin: 11.4 g/dL — ABNORMAL LOW (ref 13.0–17.0)
MCH: 33.7 pg (ref 26.0–34.0)
MCHC: 32.9 g/dL (ref 30.0–36.0)
MCV: 102.7 fL — ABNORMAL HIGH (ref 80.0–100.0)
Platelets: 200 10*3/uL (ref 150–400)
RBC: 3.38 MIL/uL — ABNORMAL LOW (ref 4.22–5.81)
RDW: 14.1 % (ref 11.5–15.5)
WBC: 7.8 10*3/uL (ref 4.0–10.5)
nRBC: 0 % (ref 0.0–0.2)

## 2019-09-24 LAB — ECHOCARDIOGRAM COMPLETE
Height: 66 in
Weight: 2832.47 oz

## 2019-09-24 LAB — URINE CULTURE: Culture: NO GROWTH

## 2019-09-24 MED ORDER — FUROSEMIDE 40 MG PO TABS
40.0000 mg | ORAL_TABLET | Freq: Every day | ORAL | Status: DC
  Administered 2019-09-24 – 2019-09-27 (×4): 40 mg via ORAL
  Filled 2019-09-24 (×4): qty 1

## 2019-09-24 NOTE — Progress Notes (Signed)
  Echocardiogram 2D Echocardiogram has been performed.  Travis Kelly 09/24/2019, 11:11 AM

## 2019-09-24 NOTE — NC FL2 (Signed)
[place] MEDICAID FL2 LEVEL OF CARE SCREENING TOOL     IDENTIFICATION  Patient Name: Travis Kelly Birthdate: 1922/09/14 Sex: male Admission Date (Current Location): 09/22/2019  [facility] and [place] Number:  Herbalist and Address:  The . [facility], [place] [address], [place], [place] [address]      Provider Number: [id number]  Attending Physician Name and Address:  Jonetta Osgood, MD  Relative Name and Phone Number:       Current Level of Care: Hospital Recommended Level of Care: [place] Prior Approval Number:    Date Approved/Denied:   PASRR Number: [id number]  Discharge Plan: SNF    Current Diagnoses: Patient Active Problem List   Diagnosis Date Noted  . Acute on chronic diastolic CHF (congestive heart failure) ([place]) 09/22/2019  . PAF (paroxysmal atrial fibrillation) ([place]) 09/22/2019  . Pseudogout of knee, right 05/03/2018  . Chronic diastolic CHF (congestive heart failure) ([place]) 04/18/2018  . Abnormal liver function 04/17/2018  . BPH (benign prostatic hyperplasia) 04/16/2018  . Chronic renal insufficiency, stage 3 (moderate) 07/10/2017  . Degenerative joint disease of shoulder region 03/09/2017  . LBBB (left bundle branch block) 03/09/2017  . Dyspnea on exertion 02/24/2017  . Moderate mitral regurgitation 11/08/2016  . Elevated troponin 07/05/2016  . Normochromic normocytic anemia 07/05/2016  . History of PSVT  07/04/2016  . Blepharitis of upper and lower eyelids of both eyes 04/07/2015  . Pseudophakia of both eyes 04/07/2015  . PVD (posterior vitreous detachment), left 04/07/2015  . Status post cataract extraction and insertion of intraocular lens 01/25/2012  . Cataract, immature 05/10/2011  . Macular degeneration 05/10/2011  . Shoulder pain, bilateral 05/10/2011  . Calculus of kidney 08/13/2010  . Brachial neuritis or radiculitis 07/19/2010  . Cervical spondylosis without myelopathy 07/19/2010  . Lipoma  04/29/2010  . Dyslipidemia 02/07/2007  . Esophageal reflux 02/07/2007    Orientation RESPIRATION BLADDER Height & Weight     Self, Time, Situation, Place  Normal Incontinent, External catheter Weight: 177 lb 0.5 oz (80.3 kg) Height:  5\' 6"  (167.6 cm)  BEHAVIORAL SYMPTOMS/MOOD NEUROLOGICAL BOWEL NUTRITION STATUS      Continent Diet (Heart Healthy / Thin Liquids)  AMBULATORY STATUS COMMUNICATION OF NEEDS Skin   Extensive Assist Verbally Normal                       Personal Care Assistance Level of Assistance  Bathing, Feeding, Dressing Bathing Assistance: Maximum assistance Feeding assistance: Limited assistance Dressing Assistance: Limited assistance     Functional Limitations Info  Sight, Hearing, Speech Sight Info: Adequate Hearing Info: Impaired (Hearing Aid) Speech Info: Adequate    SPECIAL CARE FACTORS FREQUENCY  PT (By licensed PT), OT (By licensed OT)     PT Frequency: 3-5x week OT Frequency: 3-5x week            Contractures Contractures Info: Not present    Additional Factors Info  Code Status, Allergies Code Status Info: DNR Allergies Info: Rocephin Ceftriaxone Sodium In Dextrose, Barbital           Current Medications (09/24/2019):  This is the current hospital active medication list Current Facility-Administered Medications  Medication Dose Route Frequency Provider Last Rate Last Admin  . 0.9 %  sodium chloride infusion  250 mL Intravenous PRN Etta Quill, DO      . acetaminophen (TYLENOL) tablet 650 mg  650 mg Oral Q4H PRN Etta Quill, DO   650 mg  at 09/24/19 0237  . apixaban (ELIQUIS) tablet 2.5 mg  2.5 mg Oral BID Furth, Cadence H, PA-C   2.5 mg at 09/24/19 1123  . ascorbic acid (VITAMIN C) tablet 500 mg  500 mg Oral QPM Jennette Kettle M, DO   500 mg at 09/22/19 2341  . calcium-vitamin D (OSCAL WITH D) 500-200 MG-UNIT per tablet 1 tablet  1 tablet Oral QPM Etta Quill, DO   1 tablet at 09/22/19 2340  . cephALEXin (KEFLEX)  capsule 500 mg  500 mg Oral BID Jennette Kettle M, DO   500 mg at 09/24/19 1124  . cholecalciferol (VITAMIN D3) tablet 1,000 Units  1,000 Units Oral QPM Etta Quill, DO   1,000 Units at 09/22/19 2340  . finasteride (PROSCAR) tablet 5 mg  5 mg Oral Daily Jennette Kettle M, DO   5 mg at 09/23/19 1044  . flecainide (TAMBOCOR) tablet 50 mg  50 mg Oral Q12H Jennette Kettle M, DO   50 mg at 09/24/19 1123  . fluticasone (FLONASE) 50 MCG/ACT nasal spray 1-2 spray  1-2 spray Each Nare Daily PRN Dorie Rank, MD      . furosemide (LASIX) tablet 40 mg  40 mg Oral Daily Jerline Pain, MD   40 mg at 09/24/19 1125  . metoprolol tartrate (LOPRESSOR) tablet 12.5 mg  12.5 mg Oral BID Jennette Kettle M, DO   12.5 mg at 09/24/19 1125  . multivitamin (PROSIGHT) tablet 1 tablet  1 tablet Oral Daily Jennette Kettle M, DO   1 tablet at 09/24/19 1124  . ondansetron (ZOFRAN) injection 4 mg  4 mg Intravenous Q6H PRN Etta Quill, DO      . polyvinyl alcohol (LIQUIFILM TEARS) 1.4 % ophthalmic solution 1 drop  1 drop Both Eyes Daily PRN Etta Quill, DO      . senna (SENOKOT) tablet 8.6 mg  1 tablet Oral QPM Jennette Kettle M, DO   8.6 mg at 09/22/19 2341  . sodium chloride flush (NS) 0.9 % injection 3 mL  3 mL Intravenous Q12H Jennette Kettle M, DO   3 mL at 09/23/19 2100  . sodium chloride flush (NS) 0.9 % injection 3 mL  3 mL Intravenous PRN Etta Quill, DO         Discharge Medications: Please see discharge summary for a list of discharge medications.  Relevant Imaging Results:  Relevant Lab Results:   Additional Information SSN [id number]   Barbette Or, [place]

## 2019-09-24 NOTE — Evaluation (Signed)
Occupational Therapy Evaluation Patient Details Name: Travis Kelly MRN: [id number] DOB: 1923-02-24 Today's Date: 09/24/2019    History of Present Illness 86 yo male with onset of volume overload was admitted with CHF, UTI and has been in a-fib.  Diuresed now and working toward balance of BP and volume.  PMHx:  L BBB, PSVT, CHF, OA knees, CKD3, mitral regurg, macular degeneration, Atherosclerosis, cardiomegaly, neurogenic bladder   Clinical Impression   Patient with functional deficits listed below impacting safety and independence with self care. Patient require increased time for bed mobility and sit to stand from EOB, toilet pt reports has very arthritic knees. Patient min A for safety with functional transfers and min cues for body mechanics. Educate patient in pursed lip breathing techniques as he does drop briefly to 87-88% with activity on room air, quickly recovers once seated to mid 90s. Patient and DTR expressing wanting patient to return to ILF which has CNA assist available, DTR also states can talk with [facility] about increasing caregiver assist they provide as well.   BP semi-supine 113/74 BP EOB 113/84 Standing 113/92  Pt reported mild dizziness with supine to sit but dissipated with resting EOB.    Follow Up Recommendations  Home health OT;Supervision/Assistance - 24 hour    Equipment Recommendations  None recommended by OT    Recommendations for Other Services       Precautions / Restrictions Precautions Precautions: Fall Precaution Comments: orthostatics - neg with OT 6/15 Restrictions Weight Bearing Restrictions: No      Mobility Bed Mobility Overal bed mobility: Needs Assistance Bed Mobility: Supine to Sit;Sit to Supine     Supine to sit: Min guard;HOB elevated Sit to supine: Min guard   General bed mobility comments: for safety  Transfers Overall transfer level: Needs assistance Equipment used: Rolling walker (2 wheeled) Transfers: Sit to/from  Stand Sit to Stand: Min assist         General transfer comment: increased time to scoot out towards edge of bed, min A for safety, pt reports very arthritic knees    Balance Overall balance assessment: Needs assistance Sitting-balance support: Feet supported Sitting balance-Leahy Scale: Fair     Standing balance support: Bilateral upper extremity supported;During functional activity Standing balance-Leahy Scale: Poor Standing balance comment: reliant on external support                           ADL either performed or assessed with clinical judgement   ADL Overall ADL's : Needs assistance/impaired     Grooming: Wash/dry hands;Oral care;Set up;Sitting   Upper Body Bathing: Set up;Sitting   Lower Body Bathing: Moderate assistance;Sitting/lateral leans;Sit to/from stand Lower Body Bathing Details (indicate cue type and reason): has assist at baseline Upper Body Dressing : Set up;Sitting   Lower Body Dressing: Moderate assistance;Sitting/lateral leans;Sit to/from stand Lower Body Dressing Details (indicate cue type and reason): has assist at baseline Toilet Transfer: Minimal assistance;Regular Toilet;RW;Grab bars;Ambulation Toilet Transfer Details (indicate cue type and reason): min cues for body mechanics, increased time  Toileting- Clothing Manipulation and Hygiene: Minimal assistance;Sit to/from stand Toileting - Clothing Manipulation Details (indicate cue type and reason): for thoroughness     Functional mobility during ADLs: Minimal assistance;Rolling walker;Cueing for safety General ADL Comments: patient/DTR state patient is doing better today vs with PT yesterday requiring min A for functional transfers with rolling walker. Patient does have assist at baseline with dressing/bathing which DTR states will look into increasing with [facility]/I  living staff     Vision Baseline Vision/History: Glaucoma              Pertinent Vitals/Pain Pain Assessment:  No/denies pain     Hand Dominance Right   Extremity/Trunk Assessment Upper Extremity Assessment Upper Extremity Assessment: Generalized weakness   Lower Extremity Assessment Lower Extremity Assessment: Defer to PT evaluation   Cervical / Trunk Assessment Cervical / Trunk Assessment: Kyphotic   Communication Communication Communication: HOH   Cognition Arousal/Alertness: Awake/alert Behavior During Therapy: WFL for tasks assessed/performed Overall Cognitive Status: Within Functional Limits for tasks assessed                                     General Comments  patient briefly drops to 87-88% with activity on room air, educate in pursed lip breathing technique and to carry over when ambulating pt verbalize understanding. Once seated/at rest patient quickly recovers to mid 90s on room air            Home Living Family/patient expects to be discharged to:: Other (Comment) (Independent living facility)                             Home Equipment: Gilford Rile - 2 wheels;Shower seat;Electric scooter   Additional Comments: uses scooter for longer trips in facility and walker in his room      Prior Functioning/Environment Level of Independence: Needs assistance  Gait / Transfers Assistance Needed: RW for room and scooter for hallways ADL's / Homemaking Assistance Needed: between facility staff and [facility] benefits patient has assist with dressing/bathing in the morning, DTR states can look into getting more help from [place] as needed.    Comments: DTR states patient has been participating in PT at I living facility 2x/week        OT Problem List: Decreased activity tolerance;Impaired balance (sitting and/or standing);Decreased strength;Decreased safety awareness      OT Treatment/Interventions: Self-care/ADL training;Therapeutic exercise;Energy conservation;DME and/or AE instruction;Therapeutic activities;Patient/family education;Balance training    OT  Goals(Current goals can be found in the care plan section) Acute Rehab OT Goals Patient Stated Goal: to I living with [facility] OT Goal Formulation: With patient/family Time For Goal Achievement: 10/08/19 Potential to Achieve Goals: Good  OT Frequency: Min 2X/week    AM-PAC OT "6 Clicks" Daily Activity     Outcome Measure Help from another person eating meals?: None Help from another person taking care of personal grooming?: A Little Help from another person toileting, which includes using toliet, bedpan, or urinal?: A Little Help from another person bathing (including washing, rinsing, drying)?: A Lot Help from another person to put on and taking off regular upper body clothing?: A Little Help from another person to put on and taking off regular lower body clothing?: A Lot 6 Click Score: 17   End of Session Equipment Utilized During Treatment: Rolling walker Nurse Communication: Mobility status;Other (comment) (need for other recliner)  Activity Tolerance: Patient tolerated treatment well Patient left: in bed;with call bell/phone within reach;with bed alarm set;with family/visitor present  OT Visit Diagnosis: Unsteadiness on feet (R26.81);Other abnormalities of gait and mobility (R26.89)                Time: 1749-4496 OT Time Calculation (min): 58 min Charges:  OT General Charges $OT Visit: 1 Visit OT Evaluation $OT Eval Moderate Complexity: 1 Mod OT Treatments $Self Care/Home  Management : 38-52 mins  Delbert Phenix OT OT office: [phone]  Rosemary Holms 09/24/2019, 2:26 PM

## 2019-09-24 NOTE — TOC Initial Note (Signed)
Transition of Care [facility]) - Initial/Assessment Note    Patient Details  Name: Travis Kelly MRN: [id number] Date of Birth: 12-18-1922  [facility] CM/SW Contact:    Travis Kelly, Travis Kelly Phone Number: 09/24/2019, 5:29 PM  Clinical Narrative:                  CSW visit patient at bedside along with his daughters,Travis Kelly and Travis Kelly. CSW introduced self and explained role. Patient is form [facility]. CSW discussed shrot term regab at SNF. Patient and family declined SNF placement. Patient states he has made more progress since working with PT yesterday. Family states patient has support at home. He has an aide that comes on  M,W,F,Sun for 4 hrs, through his [facility] benefits, with [facility]. He was receiving PT/[facility] with [facility] and they would like to resume their services.   Patient needs [place] orders RNCM updated the patient will discharge home.   Travis Kelly, MSW, [place] Clinical Social Worker   Expected Discharge Plan: [place]     Patient Goals and [facility] Choice        Expected Discharge Plan and Services Expected Discharge Plan: [place] In-house Referral: Clinical Social Work                                            Prior Living Arrangements/Services       Do you feel safe going back to the place where you live?: Yes      Need for Family Participation in Patient Care: Yes (Comment) Care giver support system in place?: Yes (comment)      Activities of Daily Living      Permission Sought/Granted Permission sought to share information with : Family Supports, Customer service manager, Case Optician, dispensing granted to share information with : Yes, Verbal Permission Granted              Emotional Assessment   Attitude/Demeanor/Rapport: Engaged Affect (typically observed): Pleasant, Appropriate Orientation: : Oriented to Situation, Oriented to  Time, Oriented  to Place, Oriented to Self Alcohol / Substance Use: Not Applicable Psych Involvement: No (comment)  Admission diagnosis:  Renal insufficiency [N28.9] CHF exacerbation (HCC) [I50.9] Congestive heart failure, unspecified HF chronicity, unspecified heart failure type [facility]) [I50.9] Patient Active Problem List   Diagnosis Date Noted  . Acute on chronic diastolic CHF (congestive heart failure) ([place]) 09/22/2019  . PAF (paroxysmal atrial fibrillation) ([place]) 09/22/2019    Class: Diagnosis of  . Pseudogout of knee, right 05/03/2018  . Chronic diastolic CHF (congestive heart failure) ([place]) 04/18/2018  . Abnormal liver function 04/17/2018  . BPH (benign prostatic hyperplasia) 04/16/2018  . Chronic renal insufficiency, stage 3 (moderate) 07/10/2017  . Degenerative joint disease of shoulder region 03/09/2017  . LBBB (left bundle branch block) 03/09/2017  . Dyspnea on exertion 02/24/2017  . Moderate mitral regurgitation 11/08/2016  . Elevated troponin 07/05/2016  . Normochromic normocytic anemia 07/05/2016  . History of PSVT  07/04/2016  . Blepharitis of upper and lower eyelids of both eyes 04/07/2015  . Pseudophakia of both eyes 04/07/2015  . PVD (posterior vitreous detachment), left 04/07/2015  . Status post cataract extraction and insertion of intraocular lens 01/25/2012  . Cataract, immature 05/10/2011  . Macular degeneration 05/10/2011  . Shoulder pain, bilateral 05/10/2011  . Calculus of kidney  08/13/2010  . Brachial neuritis or radiculitis 07/19/2010  . Cervical spondylosis without myelopathy 07/19/2010  . Lipoma 04/29/2010  . Dyslipidemia 02/07/2007  . Esophageal reflux 02/07/2007   PCP:  [name], MD Pharmacy:   [id number] - [place], [place] [address] [place] [address] Phone: [phone] Fax: [phone]  [facility] [place], [place] [address] Phone: [phone] Fax: [phone]     Social Determinants of Health (SDOH) Interventions    Readmission Risk Interventions No flowsheet data found.

## 2019-09-24 NOTE — Progress Notes (Addendum)
Progress Note  Patient Name: Travis Kelly Date of Encounter: 09/24/2019  Primary Cardiologist:  Quay Burow, MD  Subjective   Breathing better Says dry wt is 165 lbs, but was not weighing daily, thinks he had gotten up to about 170 lbs Mildly aware of the Afib, not sure exactly when it started  Inpatient Medications    Scheduled Meds: . apixaban  2.5 mg Oral BID  . vitamin C  500 mg Oral QPM  . calcium-vitamin D  1 tablet Oral QPM  . cephALEXin  500 mg Oral BID  . cholecalciferol  1,000 Units Oral QPM  . finasteride  5 mg Oral Daily  . flecainide  50 mg Oral Q12H  . furosemide  40 mg Intravenous BID  . metoprolol tartrate  12.5 mg Oral BID  . multivitamin  1 tablet Oral Daily  . senna  1 tablet Oral QPM  . sodium chloride flush  3 mL Intravenous Q12H   Continuous Infusions: . sodium chloride     PRN Meds: sodium chloride, acetaminophen, fluticasone, ondansetron (ZOFRAN) IV, polyvinyl alcohol, sodium chloride flush   Vital Signs    Vitals:   09/23/19 2318 09/24/19 0343 09/24/19 0345 09/24/19 0830  BP: 106/66 107/71  113/80  Pulse: 96 100  (!) 107  Resp: 18 18  20   Temp: 98.6 F (37 C) 98.4 F (36.9 C)    TempSrc: Oral Oral    SpO2: 97% 99%  93%  Weight:   80.3 kg   Height:        Intake/Output Summary (Last 24 hours) at 09/24/2019 1032 Last data filed at 09/23/2019 2113 Gross per 24 hour  Intake 240 ml  Output 1300 ml  Net -1060 ml   Filed Weights   09/22/19 1209 09/23/19 0424 09/24/19 0345  Weight: 77.1 kg 80 kg 80.3 kg   Last Weight  Most recent update: 09/24/2019  3:45 AM   Weight  80.3 kg (177 lb 0.5 oz)           Weight change: 3.189 kg   Telemetry    Atrial fib, rate approx 100, higher w/ exertion - Personally Reviewed  ECG    None today - Personally Reviewed  Physical Exam   General: Well developed, well nourished, elderly male appearing in no acute distress. Head: Normocephalic, atraumatic.  Neck: Supple without bruits,  JVD 10 cm. Lungs:  Resp regular and unlabored, rales bases. Heart: Irreg R&R, S1, S2, no S3, S4, or murmur; no rub. Abdomen: Soft, non-tender, non-distended with normoactive bowel sounds. No hepatomegaly. No rebound/guarding. No obvious abdominal masses. Extremities: No clubbing, cyanosis, trace-1+ LE edema. Radial pulses are 2+ bilaterally. Neuro: Alert and oriented X 3. Moves all extremities spontaneously. Psych: Normal affect.  Labs    Hematology Recent Labs  Lab 09/22/19 1233 09/24/19 0230  WBC 16.8* 7.8  RBC 3.79* 3.38*  HGB 12.8* 11.4*  HCT 40.0 34.7*  MCV 105.5* 102.7*  MCH 33.8 33.7  MCHC 32.0 32.9  RDW 14.6 14.1  PLT 171 200    Chemistry Recent Labs  Lab 09/22/19 1233 09/23/19 0447 09/24/19 0230  NA 136 137 139  K 4.8 3.7 3.8  CL 100 102 102  CO2 18* 26 27  GLUCOSE 122* 105* 104*  BUN 39* 40* 38*  CREATININE 1.85* 1.76* 1.55*  CALCIUM 8.7* 8.4* 8.5*  GFRNONAA 30* 32* 37*  GFRAA 35* 37* 43*  ANIONGAP 18* 9 10     High Sensitivity Troponin:   Recent Labs  Lab 09/22/19 1223 09/22/19 1416  TROPONINIHS 46* 49*      BNP Recent Labs  Lab 09/22/19 1223  BNP 1,552.3*     DDimer No results for input(s): DDIMER in the last 168 hours.   Radiology    DG Chest Portable 1 View  Result Date: 09/22/2019 CLINICAL DATA:  Shortness of breath, decreased urine output EXAM: PORTABLE CHEST 1 VIEW COMPARISON:  04/16/2018 FINDINGS: Stable cardiomegaly without CHF or pneumonia. Negative for edema, effusion or pneumothorax. Trachea midline. Aorta atherosclerotic. IMPRESSION: Stable cardiomegaly without acute chest process or interval change. Aortic Atherosclerosis (ICD10-I70.0). Electronically Signed   By: Jerilynn Mages.  Shick M.D.   On: 09/22/2019 13:05   Cardiac Studies   ECHO:  Ordered  Patient Profile     86 y.o. male w/ hx  PSVT, chronic diastolic CHF, urinary retention/neurogenic bladder (patient self catheterizes), LBBB, moderate MR (opting for noninvasive tx),  dyslipidemia who was admitted 06/13 with urinary retention, SOB, edema, Afib. Cards saw for CHF/Afib.  Assessment & Plan    1. Afib - on home dose flecainide 50 mg bid - pt w/ underlying conduction system dz, w/ LBBB and 1st deg AVB>>no dose change on flecainide per Dr Rayann Heman - LA severely dilated, RA normal on 04/2018 echo>>repeat pending - consider EP consult for more management issues, although, pt had missed 3 doses Flecainide last week>>if compliant it may still work  2. Acute on chronic diastolic CHF - diuresed 437 cc overnight w/ improvement in sx - wt up 7 lbs, will make sure they do standing wts    Principal Problem:   Acute on chronic diastolic CHF (congestive heart failure) (HCC) Active Problems:   History of PSVT    Moderate mitral regurgitation   Chronic renal insufficiency, stage 3 (moderate)   PAF (paroxysmal atrial fibrillation) ([place])    [name] , PA-C 10:32 AM 09/24/2019 Pager: [phone]  Personally seen and examined. Agree with above.   Yesterday had discussion about anticoagulation with he and family.  Decided to pursue.  Low-dose Eliquis dose adjusted.  Continuing with flecainide also lower dose 50 mg twice a day.  Has interventricular conduction delay.  Has been on flecainide for some time now.  Careful with further conduction deterioration.  May need pacemaker at some point. He may end up converting with flecainide.  If not, we could consider DC cardioversion 3 weeks after not missing dose of Eliquis.  Was able to diurese approximately 1 L.  Feels better.  I will change him to Lasix 40 mg once a day instead of 20 mg once a day.  No edema on exam.  No crackles.  Breathing comfortably in bed.  Spoke with his daughter.  He does have VA benefits.  Could possibly get more help at home.  Recommended SNF yesterday by PT.  He did better today with occupational therapy.  Perhaps he would be able to go home.  Could consider discharge today if  able. No aspirin since he is on Eliquis.  Candee Furbish, MD

## 2019-09-24 NOTE — Care Management (Signed)
CM spoke with attending physician. Patient scheduled for discharge on 09/25/19.See previous TOC CM note.

## 2019-09-24 NOTE — Progress Notes (Signed)
PROGRESS NOTE        PATIENT DETAILS Name: Travis Kelly Age: 86 y.o. Sex: male Date of Birth: 04-04-1923 Admit Date: 09/22/2019 Admitting Physician Etta Quill, DO OEV:[id number], Nicki Reaper, MD  Brief Narrative: Patient is a 86 y.o. male with history of PSVT, PAF, moderate MR, HLD, chronic diastolic heart failure, chronic neurogenic bladder (in/out catheterization at home at least 3 times daily)-who presented to the hospital with shortness of breath worsening lower extremity edema for approximately 2 days-found to have acute hypoxic respiratory failure secondary to decompensated diastolic heart failure and admitted to the hospitalist service.  See below for further details.   Significant events: 6/13>> admit to [facility] for hypoxia from acute on chronic diastolic heart failure  Significant studies: 09/24/2019>> TTE: Pending 09/22/2019>> chest x-ray: Stable cardiomegaly without acute chest process/interval change. 04/17/2018>> TTE: EF 55-60%, moderate MR, moderate pulmonary hypertension   Antimicrobial therapy: Keflex: 6/9>>  Microbiology data: 6/13: Urine culture>> no growth  Procedures : None  Consults: Cardiology  DVT Prophylaxis : apixaban (ELIQUIS) tablet 2.5 mg Start: 09/23/19 1145 apixaban (ELIQUIS) tablet 2.5 mg    Subjective: Feels better-no longer on oxygen.  Assessment/Plan: Acute hypoxic respiratory failure: Secondary to decompensated diastolic heart failure-apparently was on 4 L of oxygen when he first presented-now much improved and on room air.    Acute on chronic diastolic heart failure: Hypoxia has resolved-volume status markedly better-initially on IV Lasix-transition to oral Lasix.  Continue to follow weights/intake output/electrolytes.  Persistent atrial fibrillation: Rate controlled with metoprolol and flecainide-cardiology discussed with patient/family-has been started on Eliquis.  Plans are for consideration of cardioversion in the  outpatient setting if he is still in persistent A. fib upon follow-up..  CKD stage IIIb: Creatinine not far from usual baseline-follow periodically.  History of PSVT: Continue flecainide-monitor on telemetry.  Recent UTI: Started on Keflex on 6/9 by PCP-7-day course plan-continue.  Urine culture negative-patient was already on antimicrobial therapy when urine culture was collected  History of moderate mitral regurgitation: Per prior outpatient notes-patient not interested in surgical correction.  Moderate pulmonary hypertension: Likely secondary to MR/diastolic heart failure-further work-up will not change management or outcome.  History of ?  Neurogenic bladder: Per patient-does in and out catheterization at least 3 times daily at home.  He does urinate/dribble as well but apparently is incontinent.  Continue close monitoring with frequent bladder scans-while on diuretic regimen.  Deconditioning/debility: Apparently walks with the help of a walker at baseline-appreciate rehab services evaluation-recommendations are for SNF-I have spoken with case management today.  Diet: Diet Order            Diet Heart Room service appropriate? Yes; Fluid consistency: Thin  Diet effective now                  Code Status:  DNR  Family Communication: Spoke with daughter over phone on 6/15  Disposition Plan: Status is: Inpatient  Remains inpatient appropriate because:Inpatient level of care appropriate due to severity of illness  Dispo: The patient is from: independent living              Anticipated d/c is to: SNF vs Independent  living              Anticipated d/c date is: 2 days              Patient currently  is  medically stable to d/c.  Barriers to Discharge: Awaiting safe disposition-CM/social work will reach out to patient's independent living facility to see if he can go back there with maximal home health services or needs to be in SNF for short-term before being downgraded back to  independent living.  Antimicrobial agents: Anti-infectives (From admission, onward)   Start     Dose/Rate Route Frequency Ordered Stop   09/22/19 2200  cephALEXin (KEFLEX) capsule 500 mg     Discontinue     500 mg Oral 2 times daily 09/22/19 2034 09/26/19 0959   09/22/19 1530  ciprofloxacin (CIPRO) IVPB 200 mg  Status:  Discontinued        200 mg 100 mL/hr over 60 Minutes Intravenous  Once 09/22/19 1520 09/22/19 2005       Time spent: 25 minutes-Greater than 50% of this time was spent in counseling, explanation of diagnosis, planning of further management, and coordination of care.  MEDICATIONS: Scheduled Meds: . apixaban  2.5 mg Oral BID  . vitamin C  500 mg Oral QPM  . calcium-vitamin D  1 tablet Oral QPM  . cephALEXin  500 mg Oral BID  . cholecalciferol  1,000 Units Oral QPM  . finasteride  5 mg Oral Daily  . flecainide  50 mg Oral Q12H  . furosemide  40 mg Oral Daily  . metoprolol tartrate  12.5 mg Oral BID  . multivitamin  1 tablet Oral Daily  . senna  1 tablet Oral QPM  . sodium chloride flush  3 mL Intravenous Q12H   Continuous Infusions: . sodium chloride     PRN Meds:.sodium chloride, acetaminophen, fluticasone, ondansetron (ZOFRAN) IV, polyvinyl alcohol, sodium chloride flush   PHYSICAL EXAM: Vital signs: Vitals:   09/23/19 2318 09/24/19 0343 09/24/19 0345 09/24/19 0830  BP: 106/66 107/71  113/80  Pulse: 96 100  (!) 107  Resp: 18 18  20   Temp: 98.6 F (37 C) 98.4 F (36.9 C)    TempSrc: Oral Oral    SpO2: 97% 99%  93%  Weight:   80.3 kg   Height:       Filed Weights   09/22/19 1209 09/23/19 0424 09/24/19 0345  Weight: 77.1 kg 80 kg 80.3 kg   Body mass index is 28.57 kg/m.   Gen Exam:Alert awake-not in any distress HEENT:atraumatic, normocephalic Chest: B/L clear to auscultation anteriorly CVS:S1S2 regular Abdomen:soft non tender, non distended Extremities:trace edema Neurology: Non focal Skin: no rash  I have personally reviewed following  labs and imaging studies  LABORATORY DATA: CBC: Recent Labs  Lab 09/22/19 1233 09/24/19 0230  WBC 16.8* 7.8  NEUTROABS 14.8*  --   HGB 12.8* 11.4*  HCT 40.0 34.7*  MCV 105.5* 102.7*  PLT 171 629    Basic Metabolic Panel: Recent Labs  Lab 09/22/19 1233 09/23/19 0447 09/24/19 0230  NA 136 137 139  K 4.8 3.7 3.8  CL 100 102 102  CO2 18* 26 27  GLUCOSE 122* 105* 104*  BUN 39* 40* 38*  CREATININE 1.85* 1.76* 1.55*  CALCIUM 8.7* 8.4* 8.5*    GFR: Estimated Creatinine Clearance: 28.4 mL/min (A) (by C-G formula based on SCr of 1.55 mg/dL (H)).  Liver Function Tests: No results for input(s): AST, ALT, ALKPHOS, BILITOT, PROT, ALBUMIN in the last 168 hours. No results for input(s): LIPASE, AMYLASE in the last 168 hours. No results for input(s): AMMONIA in the last 168 hours.  Coagulation Profile: No results for input(s): INR, PROTIME in the  last 168 hours.  Cardiac Enzymes: No results for input(s): CKTOTAL, CKMB, CKMBINDEX, TROPONINI in the last 168 hours.  BNP (last 3 results) Recent Labs    10/03/18 1459  PROBNP 4,125*    Lipid Profile: No results for input(s): CHOL, HDL, LDLCALC, TRIG, CHOLHDL, LDLDIRECT in the last 72 hours.  Thyroid Function Tests: No results for input(s): TSH, T4TOTAL, FREET4, T3FREE, THYROIDAB in the last 72 hours.  Anemia Panel: No results for input(s): VITAMINB12, FOLATE, FERRITIN, TIBC, IRON, RETICCTPCT in the last 72 hours.  Urine analysis:    Component Value Date/Time   COLORURINE AMBER (A) 09/22/2019 1408   APPEARANCEUR CLOUDY (A) 09/22/2019 1408   LABSPEC >1.030 (H) 09/22/2019 1408   PHURINE 5.5 09/22/2019 1408   GLUCOSEU NEGATIVE 09/22/2019 1408   HGBUR NEGATIVE 09/22/2019 1408   BILIRUBINUR NEGATIVE 09/22/2019 1408   KETONESUR NEGATIVE 09/22/2019 1408   PROTEINUR 100 (A) 09/22/2019 1408   NITRITE NEGATIVE 09/22/2019 1408   LEUKOCYTESUR NEGATIVE 09/22/2019 1408    Sepsis Labs: Lactic Acid, Venous No results found  for: LATICACIDVEN  MICROBIOLOGY: Recent Results (from the past 240 hour(s))  SARS Coronavirus 2 by RT PCR (hospital order, performed in [place] hospital lab) Nasopharyngeal Nasopharyngeal Swab     Status: None   Collection Time: 09/22/19 12:33 PM   Specimen: Nasopharyngeal Swab  Result Value Ref Range Status   SARS Coronavirus 2 NEGATIVE NEGATIVE Final    Comment: (NOTE) SARS-CoV-2 target nucleic acids are NOT DETECTED.  The SARS-CoV-2 RNA is generally detectable in upper and lower respiratory specimens during the acute phase of infection. The lowest concentration of SARS-CoV-2 viral copies this assay can detect is 250 copies / mL. A negative result does not preclude SARS-CoV-2 infection and should not be used as the sole basis for treatment or other patient management decisions.  A negative result may occur with improper specimen collection / handling, submission of specimen other than nasopharyngeal swab, presence of viral mutation(s) within the areas targeted by this assay, and inadequate number of viral copies (<250 copies / mL). A negative result must be combined with clinical observations, patient history, and epidemiological information.  Fact Sheet for Patients:   StrictlyIdeas.no  Fact Sheet for Healthcare Providers: BankingDealers.co.za  This test is not yet approved or  cleared by the [place] FDA and has been authorized for detection and/or diagnosis of SARS-CoV-2 by FDA under an Emergency Use Authorization (EUA).  This EUA will remain in effect (meaning this test can be used) for the duration of the COVID-19 declaration under Section 564(b)(1) of the Act, 21 U.S.C. section 360bbb-3(b)(1), unless the authorization is terminated or revoked sooner.  Performed at [facility], [address] [place], [place] [address]   Culture, Urine     Status: None   Collection Time: 09/22/19 11:58 PM   Specimen:  Urine, Random  Result Value Ref Range Status   Specimen Description URINE, RANDOM  Final   Special Requests NONE  Final   Culture   Final    NO GROWTH Performed at [facility], [place] [address], [place], [place] [address]    Report Status 09/24/2019 FINAL  Final    RADIOLOGY STUDIES/RESULTS: DG Chest Portable 1 View  Result Date: 09/22/2019 CLINICAL DATA:  Shortness of breath, decreased urine output EXAM: PORTABLE CHEST 1 VIEW COMPARISON:  04/16/2018 FINDINGS: Stable cardiomegaly without CHF or pneumonia. Negative for edema, effusion or pneumothorax. Trachea midline. Aorta atherosclerotic. IMPRESSION: Stable cardiomegaly without acute chest process  or interval change. Aortic Atherosclerosis (ICD10-I70.0). Electronically Signed   By: Jerilynn Mages.  Shick M.D.   On: 09/22/2019 13:05     LOS: 2 days   Oren Binet, MD  [facility]    To contact the attending provider between 7A-7P or the covering provider during after hours 7P-7A, please log into the web site [email or web address] and access using universal [place] password for that web site. If you do not have the password, please call the hospital operator.  09/24/2019, 11:37 AM

## 2019-09-24 NOTE — TOC Transition Note (Signed)
Transition of Care [facility]) - CM/SW Discharge Note   Patient Details  Name: Travis Kelly MRN: [id number] Date of Birth: 28-Oct-1922  Transition of Care [facility]) CM/SW Contact:  Bethena Roys, RN Phone Number: 09/24/2019, 6:58 PM   Clinical Narrative:  Case Manager received a call from the charge RN that daughter wanted to take the patient back to [facility]. Patient is active with [facility] at the facility. Case Manager did call Liaison Rollene Fare- and confirmed that patient is active with [facility]. Patient will need resumption orders if discharges home tonight. If physician writes discharge orders, daughter will transport the patient home. Rollene Fare will get the orders from Epic in am. No further needs from Case Manager at this time.    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

## 2019-09-25 DIAGNOSIS — I5021 Acute systolic (congestive) heart failure: Secondary | ICD-10-CM

## 2019-09-25 DIAGNOSIS — I472 Ventricular tachycardia: Secondary | ICD-10-CM

## 2019-09-25 DIAGNOSIS — I429 Cardiomyopathy, unspecified: Secondary | ICD-10-CM

## 2019-09-25 DIAGNOSIS — I4891 Unspecified atrial fibrillation: Secondary | ICD-10-CM

## 2019-09-25 LAB — BASIC METABOLIC PANEL
Anion gap: 10 (ref 5–15)
BUN: 32 mg/dL — ABNORMAL HIGH (ref 8–23)
CO2: 29 mmol/L (ref 22–32)
Calcium: 9.2 mg/dL (ref 8.9–10.3)
Chloride: 102 mmol/L (ref 98–111)
Creatinine, Ser: 1.39 mg/dL — ABNORMAL HIGH (ref 0.61–1.24)
GFR calc Af Amer: 50 mL/min — ABNORMAL LOW (ref >60)
GFR calc non Af Amer: 43 mL/min — ABNORMAL LOW (ref >60)
Glucose, Bld: 108 mg/dL — ABNORMAL HIGH (ref 70–99)
Potassium: 4.5 mmol/L (ref 3.5–5.1)
Sodium: 141 mmol/L (ref 135–145)

## 2019-09-25 MED ORDER — METOPROLOL TARTRATE 25 MG PO TABS
25.0000 mg | ORAL_TABLET | Freq: Two times a day (BID) | ORAL | Status: DC
  Administered 2019-09-25 – 2019-09-26 (×2): 25 mg via ORAL
  Filled 2019-09-25 (×2): qty 1

## 2019-09-25 MED ORDER — MUSCLE RUB 10-15 % EX CREA
TOPICAL_CREAM | CUTANEOUS | Status: DC | PRN
  Filled 2019-09-25: qty 85

## 2019-09-25 MED ORDER — METOPROLOL TARTRATE 5 MG/5ML IV SOLN: 5.0000 mg | INTRAVENOUS | Status: DC | PRN

## 2019-09-25 NOTE — Progress Notes (Signed)
PROGRESS NOTE        PATIENT DETAILS Name: Travis Kelly Age: 86 y.o. Sex: male Date of Birth: 18-Apr-1922 Admit Date: 09/22/2019 Admitting Physician Etta Quill, DO [facility]:[email or web address], Nicki Reaper, MD  Brief Narrative: Patient is a 86 y.o. male with history of PSVT, PAF, moderate MR, HLD, chronic diastolic heart failure, chronic neurogenic bladder (in/out catheterization at home at least 3 times daily)-who presented to the hospital with shortness of breath worsening lower extremity edema for approximately 2 days-found to have acute hypoxic respiratory failure secondary to decompensated diastolic heart failure and admitted to the hospitalist service.  See below for further details.   Significant events: 6/13>> admit to [facility] for hypoxia from acute on chronic diastolic heart failure  Significant studies: 09/24/2019>> TTE: EF 23-53%, grade 2 diastolic dysfunction, RV systolic function reduced, moderate MR 09/22/2019>> chest x-ray: Stable cardiomegaly without acute chest process/interval change. 04/17/2018>> TTE: EF 55-60%, moderate MR, moderate pulmonary hypertension  Antimicrobial therapy: Keflex: 6/9>>  Microbiology data: 6/13: Urine culture>> no growth  Procedures : None  Consults: Cardiology  DVT Prophylaxis : apixaban (ELIQUIS) tablet 2.5 mg Start: 09/23/19 1145 apixaban (ELIQUIS) tablet 2.5 mg    Subjective: Lying comfortably in bed-heart rate 120s-140s.  Assessment/Plan: Acute hypoxic respiratory failure: Secondary to decompensated diastolic heart failure-apparently was on 4 L of oxygen when he first presented-now much improved and on room air.    Acute on chronic diastolic heart failure: Hypoxia has resolved-volume status markedly better-initially on IV Lasix-transition to oral Lasix.  Continue to follow weights/intake output/electrolytes.  Persistent atrial fibrillation with RVR: Rate uncontrolled this morning-continue metoprolol-flecainide discontinued  due to suppressed EF found on latest echo.  Continue Eliquis-cardiology has consulted EP-await further recommendations.  Moderate mitral regurgitation: Cardiology requesting structural heart team evaluation-to see if patient is a candidate for potential mitral clip procedure.  Await further recommendations.  CKD stage IIIb: Creatinine not far from usual baseline-follow periodically.  History of PSVT: Currently in persistent A. fib-continue telemetry monitoring.  Await EP evaluation.  Flecainide discontinued on 6/16 due to low EF.  Recent UTI: Started on Keflex on 6/9 by PCP-7-day course plan-continue.  Urine culture negative-patient was already on antimicrobial therapy when urine culture was collected  Moderate pulmonary hypertension: Likely secondary to MR/diastolic heart failure/systolic heart failure-further work-up will not change management or outcome.  History of ?  Neurogenic bladder: Per patient-does in and out catheterization at least 3 times daily at home.  He does urinate/dribble as well but apparently is incontinent.  Continue close monitoring with frequent bladder scans-while on diuretic regimen.  Deconditioning/debility: Apparently walks with the help of a walker at baseline-appreciate rehab services evaluation-recommendations are for SNF-but per case management-okay to go back to independent living with home health services.  Diet: Diet Order            Diet Heart Room service appropriate? Yes; Fluid consistency: Thin  Diet effective now                  Code Status:  DNR  Family Communication: Spoke with daughter-[name]-over phone on 6/16  Disposition Plan: Status is: Inpatient  Remains inpatient appropriate because:Inpatient level of care appropriate due to severity of illness  Dispo: The patient is from: independent living              Anticipated d/c is to: SNF vs Independent  living with home health services              Anticipated d/c date is: 2 days               Patient currently is not medically stable to d/c.  Barriers to Discharge: Heart rate uncontrolled this morning with A. fib RVR-awaiting cardiology/EP evaluation.  Antimicrobial agents: Anti-infectives (From admission, onward)   Start     Dose/Rate Route Frequency Ordered Stop   09/22/19 2200  cephALEXin (KEFLEX) capsule 500 mg     Discontinue     500 mg Oral 2 times daily 09/22/19 2034 09/26/19 0959   09/22/19 1530  ciprofloxacin (CIPRO) IVPB 200 mg  Status:  Discontinued        200 mg 100 mL/hr over 60 Minutes Intravenous  Once 09/22/19 1520 09/22/19 2005       Time spent: 25 minutes-Greater than 50% of this time was spent in counseling, explanation of diagnosis, planning of further management, and coordination of care.  MEDICATIONS: Scheduled Meds: . apixaban  2.5 mg Oral BID  . vitamin C  500 mg Oral QPM  . calcium-vitamin D  1 tablet Oral QPM  . cephALEXin  500 mg Oral BID  . cholecalciferol  1,000 Units Oral QPM  . finasteride  5 mg Oral Daily  . furosemide  40 mg Oral Daily  . metoprolol tartrate  25 mg Oral BID  . multivitamin  1 tablet Oral Daily  . senna  1 tablet Oral QPM  . sodium chloride flush  3 mL Intravenous Q12H   Continuous Infusions: . sodium chloride     PRN Meds:.sodium chloride, acetaminophen, fluticasone, Muscle Rub, ondansetron (ZOFRAN) IV, polyvinyl alcohol, sodium chloride flush   PHYSICAL EXAM: Vital signs: Vitals:   09/24/19 1651 09/24/19 2008 09/24/19 2305 09/25/19 0346  BP: 118/89 120/89 115/80 (!) 147/97  Pulse: 99 99 (!) 101   Resp: 20 19 20 18   Temp: 98.2 F (36.8 C) 98.7 F (37.1 C) 98.5 F (36.9 C) 98 F (36.7 C)  TempSrc: Oral Oral Oral Oral  SpO2:  96% 98% 94%  Weight:    78.4 kg  Height:       Filed Weights   09/23/19 0424 09/24/19 0345 09/25/19 0346  Weight: 80 kg 80.3 kg 78.4 kg   Body mass index is 27.9 kg/m.   Gen Exam:Alert awake-not in any distress HEENT:atraumatic, normocephalic Chest: B/L clear to  auscultation anteriorly CVS:S1S2 regular Abdomen:soft non tender, non distended Extremities:trace edema Neurology: non focal Skin: no rash  I have personally reviewed following labs and imaging studies  LABORATORY DATA: CBC: Recent Labs  Lab 09/22/19 1233 09/24/19 0230  WBC 16.8* 7.8  NEUTROABS 14.8*  --   HGB 12.8* 11.4*  HCT 40.0 34.7*  MCV 105.5* 102.7*  PLT 171 010    Basic Metabolic Panel: Recent Labs  Lab 09/22/19 1233 09/23/19 0447 09/24/19 0230 09/25/19 0336  NA 136 137 139 141  K 4.8 3.7 3.8 4.5  CL 100 102 102 102  CO2 18* 26 27 29   GLUCOSE 122* 105* 104* 108*  BUN 39* 40* 38* 32*  CREATININE 1.85* 1.76* 1.55* 1.39*  CALCIUM 8.7* 8.4* 8.5* 9.2    GFR: Estimated Creatinine Clearance: 31.3 mL/min (A) (by C-G formula based on SCr of 1.39 mg/dL (H)).  Liver Function Tests: No results for input(s): AST, ALT, ALKPHOS, BILITOT, PROT, ALBUMIN in the last 168 hours. No results for input(s): LIPASE, AMYLASE in the last 168 hours.  No results for input(s): AMMONIA in the last 168 hours.  Coagulation Profile: No results for input(s): INR, PROTIME in the last 168 hours.  Cardiac Enzymes: No results for input(s): CKTOTAL, CKMB, CKMBINDEX, TROPONINI in the last 168 hours.  BNP (last 3 results) Recent Labs    10/03/18 1459  PROBNP 4,125*    Lipid Profile: No results for input(s): CHOL, HDL, LDLCALC, TRIG, CHOLHDL, LDLDIRECT in the last 72 hours.  Thyroid Function Tests: No results for input(s): TSH, T4TOTAL, FREET4, T3FREE, THYROIDAB in the last 72 hours.  Anemia Panel: No results for input(s): VITAMINB12, FOLATE, FERRITIN, TIBC, IRON, RETICCTPCT in the last 72 hours.  Urine analysis:    Component Value Date/Time   COLORURINE AMBER (A) 09/22/2019 1408   APPEARANCEUR CLOUDY (A) 09/22/2019 1408   LABSPEC >1.030 (H) 09/22/2019 1408   PHURINE 5.5 09/22/2019 1408   GLUCOSEU NEGATIVE 09/22/2019 1408   HGBUR NEGATIVE 09/22/2019 1408   BILIRUBINUR  NEGATIVE 09/22/2019 1408   KETONESUR NEGATIVE 09/22/2019 1408   PROTEINUR 100 (A) 09/22/2019 1408   NITRITE NEGATIVE 09/22/2019 1408   LEUKOCYTESUR NEGATIVE 09/22/2019 1408    Sepsis Labs: Lactic Acid, Venous No results found for: LATICACIDVEN  MICROBIOLOGY: Recent Results (from the past 240 hour(s))  SARS Coronavirus 2 by RT PCR (hospital order, performed in [facility] hospital lab) Nasopharyngeal Nasopharyngeal Swab     Status: None   Collection Time: 09/22/19 12:33 PM   Specimen: Nasopharyngeal Swab  Result Value Ref Range Status   SARS Coronavirus 2 NEGATIVE NEGATIVE Final    Comment: (NOTE) SARS-CoV-2 target nucleic acids are NOT DETECTED.  The SARS-CoV-2 RNA is generally detectable in upper and lower respiratory specimens during the acute phase of infection. The lowest concentration of SARS-CoV-2 viral copies this assay can detect is 250 copies / mL. A negative result does not preclude SARS-CoV-2 infection and should not be used as the sole basis for treatment or other patient management decisions.  A negative result may occur with improper specimen collection / handling, submission of specimen other than nasopharyngeal swab, presence of viral mutation(s) within the areas targeted by this assay, and inadequate number of viral copies (<250 copies / mL). A negative result must be combined with clinical observations, patient history, and epidemiological information.  Fact Sheet for Patients:   StrictlyIdeas.no  Fact Sheet for Healthcare Providers: BankingDealers.co.za  This test is not yet approved or  cleared by the [place] FDA and has been authorized for detection and/or diagnosis of SARS-CoV-2 by FDA under an Emergency Use Authorization (EUA).  This EUA will remain in effect (meaning this test can be used) for the duration of the COVID-19 declaration under Section 564(b)(1) of the Act, 21 U.S.C. section  360bbb-3(b)(1), unless the authorization is terminated or revoked sooner.  Performed at [facility], [address] [place], [place] [address]   Culture, Urine     Status: None   Collection Time: 09/22/19 11:58 PM   Specimen: Urine, Random  Result Value Ref Range Status   Specimen Description URINE, RANDOM  Final   Special Requests NONE  Final   Culture   Final    NO GROWTH Performed at [facility], [place] [address] [place], [place] [address]    Report Status 09/24/2019 FINAL  Final    RADIOLOGY STUDIES/RESULTS: ECHOCARDIOGRAM COMPLETE  Result Date: 09/24/2019    ECHOCARDIOGRAM REPORT   Patient Name:   Travis Kelly Date of Exam: 09/24/2019 Medical Rec #:  [id number]  Height:       66.0 in Accession #:    [id number]     Weight:       177.0 lb Date of Birth:  February 26, 1923       BSA:          1.899 m Patient Age:    89 years       BP:           113/80 mmHg Patient Gender: M              HR:           97 bpm. Exam Location:  Inpatient Procedure: 2D Echo, Cardiac Doppler and Color Doppler Indications:    CHF-Acute Diastolic 790.24 / O97.35  History:        Patient has prior history of Echocardiogram examinations, most                 recent 04/17/2018. CHF, Arrythmias:Atrial Fibrillation and LBBB;                 Risk Factors:Dyslipidemia and Former Smoker. PSVT. MR. DOE.                 Elevated troponin.  Sonographer:    Vickie Epley RDCS Referring Phys: [place]  1. Left ventricular ejection fraction, by estimation, is 35 to 40%. The left ventricle has moderately decreased function. The left ventricle demonstrates global hypokinesis. Left ventricular diastolic parameters are consistent with Grade II diastolic dysfunction (pseudonormalization).  2. Right ventricular systolic function is moderately reduced. The right ventricular size is moderately enlarged. There is severely elevated pulmonary artery systolic pressure. The estimated right ventricular  systolic pressure is 32.9 mmHg.  3. Left atrial size was severely dilated.  4. Right atrial size was severely dilated.  5. Suspect ruptured chorda(e) tendina(e) to the middle scallop of the posterior mitral leaflet. The severity of the mitral insufficiency jet may be underestimated due to its highly eccentric nature. The mitral valve is myxomatous. Moderate mitral valve regurgitation.  6. Tricuspid valve regurgitation is mild to moderate.  7. The aortic valve is tricuspid. Aortic valve regurgitation is mild to moderate.  8. The inferior vena cava is dilated in size with <50% respiratory variability, suggesting right atrial pressure of 15 mmHg. Comparison(s): Prior images reviewed side by side. The left ventricular function is significantly worse. FINDINGS  Left Ventricle: Left ventricular ejection fraction, by estimation, is 35 to 40%. The left ventricle has moderately decreased function. The left ventricle demonstrates global hypokinesis. The left ventricular internal cavity size was normal in size. There is no left ventricular hypertrophy. Left ventricular diastolic parameters are consistent with Grade II diastolic dysfunction (pseudonormalization). Normal left ventricular filling pressure. Right Ventricle: The right ventricular size is moderately enlarged. No increase in right ventricular wall thickness. Right ventricular systolic function is moderately reduced. There is severely elevated pulmonary artery systolic pressure. The tricuspid regurgitant velocity is 3.43 m/s, and with an assumed right atrial pressure of 15 mmHg, the estimated right ventricular systolic pressure is 92.4 mmHg. Left Atrium: Left atrial size was severely dilated. Right Atrium: Right atrial size was severely dilated. Pericardium: There is no evidence of pericardial effusion. Mitral Valve: Suspect ruptured chorda(e) tendina(e) to the middle scallop of the posterior mitral leaflet. The severity of the mitral insufficiency jet may be  underestimated due to its highly eccentric nature. The mitral valve is myxomatous. Mild to moderate mitral annular calcification. Moderate mitral valve regurgitation, with anteriorly-directed jet.  Tricuspid Valve: The tricuspid valve is normal in structure. Tricuspid valve regurgitation is mild to moderate. Aortic Valve: The aortic valve is tricuspid. . There is moderate thickening and mild calcification of the aortic valve. Aortic valve regurgitation is mild to moderate. Aortic regurgitation PHT measures 417 msec. There is moderate thickening of the aortic  valve. There is mild calcification of the aortic valve. Pulmonic Valve: The pulmonic valve was normal in structure. Pulmonic valve regurgitation is mild. Aorta: The aortic root is normal in size and structure. Venous: The inferior vena cava is dilated in size with less than 50% respiratory variability, suggesting right atrial pressure of 15 mmHg. IAS/Shunts: No atrial level shunt detected by color flow Doppler.  LEFT VENTRICLE PLAX 2D LVIDd:         5.00 cm LVIDs:         4.50 cm LV PW:         1.10 cm LV IVS:        1.10 cm LVOT diam:     1.90 cm LV SV:         25 LV SV Index:   13 LVOT Area:     2.84 cm  LV Volumes (MOD) LV vol d, MOD A2C: 111.0 ml LV vol d, MOD A4C: 112.0 ml LV vol s, MOD A2C: 86.7 ml LV vol s, MOD A4C: 64.6 ml LV SV MOD A2C:     24.3 ml LV SV MOD A4C:     112.0 ml LV SV MOD BP:      38.1 ml RIGHT VENTRICLE TAPSE (M-mode): 0.8 cm LEFT ATRIUM             Index       RIGHT ATRIUM           Index LA diam:        5.90 cm 3.11 cm/m  RA Area:     20.30 cm LA Vol (A2C):   78.5 ml 41.34 ml/m RA Volume:   58.40 ml  30.76 ml/m LA Vol (A4C):   71.4 ml 37.60 ml/m LA Biplane Vol: 77.3 ml 40.71 ml/m  AORTIC VALVE LVOT Vmax:   56.40 cm/s LVOT Vmean:  40.600 cm/s LVOT VTI:    0.088 m AI PHT:      417 msec  AORTA Ao Root diam: 3.40 cm TRICUSPID VALVE TR Peak grad:   47.1 mmHg TR Vmax:        343.00 cm/s  SHUNTS Systemic VTI:  0.09 m Systemic Diam: 1.90  cm Dani Gobble Croitoru MD Electronically signed by Sanda Klein MD Signature Date/Time: 09/24/2019/1:12:43 PM    Final      LOS: 3 days   Oren Binet, MD  [facility]    To contact the attending provider between 7A-7P or the covering provider during after hours 7P-7A, please log into the web site [email or web address] and access using universal [place] password for that web site. If you do not have the password, please call the hospital operator.  09/25/2019, 10:39 AM

## 2019-09-25 NOTE — Progress Notes (Signed)
Transitions of Care Pharmacist Note  Travis Kelly is a 86 y.o. male that has been diagnosed with A Fib and will be prescribed Eliquis (apixaban) at discharge.   Discharge Medications Plan: The patient wants to have their discharge medications filled by the Transitions of Care pharmacy rather than their usual pharmacy.  The discharge orders pharmacy has been changed to the Transitions of Care pharmacy, the patient will receive a phone call regarding co-pay, and their medications will be delivered by the Transitions of Care pharmacy.    Thank you,   Sherren Kerns, PharmD PGY1 Acute Care Pharmacy Resident September 25, 2019

## 2019-09-25 NOTE — Consult Note (Addendum)
Cardiology Consultation:   Patient ID: Travis Kelly MRN: [id number]; DOB: 07-08-1922  Admit date: 09/22/2019 Date of Consult: 09/25/2019  Primary Care Provider: [name], MD [facility] Cardiologist: Quay Burow, MD  [facility] Electrophysiologist:  Thompson Grayer, MD    Patient Profile:   Travis Kelly is a 86 y.o. male with a hx of  who is being seen today for the evaluation of macular degeneration, VHD w/ MR (notes report the patient does not want intervened on), described as at least moderate, may be severe), BPH, CKD (III), and PSVT (that historically had been responsive to vagal maneuvers >> flecainide), LBBB being seen for new Afib at the request of Dr. Marlou Porch.  History of Present Illness:   Travis Kelly was seen in consult by Dr. Rayann Heman Jan 2020 with recurrent SVT at that time vagal maneuvers did not work for him.  The patient (and  Family) did not want to pursue invasive/ablative strategies and was started on flecainide.  LBBB noted and felt monitoring closely with reduction in dose of flecainide/BB may be needed in the future.   He has had EP follow up since then and has done well, the last couple during [place] restrictions have been virtual.  Most recently Oct 2020 Dr. Rayann Heman reported "He does have some shortness of breath with exertion (at baseline). He has had some "floating sensations" - he does not have dizziness, pre syncope or syncope with these"  July EKG noted stable intervals, HR, 1st degree AVBlock and his LBBB.  He was admitted to [facility] 09/22/2019 with urinary retention, sob, and increased leg swelling. SOB had started a few days prior. He also noted low urine output. He was seen by his PCP 6/8 who started him on abx for UTI. And also noted the patient was in and out of afib and a ZIO was ordered. LLE edema worsened despite taking lasix 20 mg daily. Reported 4-5 lbs weight gain In the ER noted to be in new AFib, 110bpm  Cardiology consulted for volume OL and new  Afib.  In discussion with the patient and family, regarding a/c, his advanced age and was decided to pursue [place] with Eliquis. His HR generally found to be fairly well controlled <100 TTE noted LVEF newly reduced, 35-40%, prompting the discontinuation of his flecainide (full report below)  He is being diuresed with volume OL, on Abx for UTI  He (and his daughters at bedside) report he started to feel unusualy fatigued a couple weeks ago, no particular symptoms, just more tired, this persisted, feeling more weak and saw his PMD a week ago or so who found him apparently in AF and noted UTI.   Despite treatment of the UTI he has not felt any better, known baseline urinary retention (does in/out cath at home 3x/day)  He has not had any CP, no overt cardiac awareness or palpitations.  He was getting winded with exertion, none at rest  (some DOE has been mentioned historically) though clearly feeling worse in the last few weeks  K+ 4.5 BUN/Creat 32/1.39 (at or better then his baseline) WBC 7.8 H/H 11/34 Plts 200  HS Trop 46 > 49 BNP 1552    Past Medical History:  Diagnosis Date   Age-related macular degeneration, dry, left eye    Age-related macular degeneration, wet, right eye ([place])    Arthritis    "right knee" (04/17/2018)   CHF (congestive heart failure) (HCC)    GERD (gastroesophageal reflux disease)    Heart  murmur    History of duodenal ulcer    History of hiatal hernia    LAFB (left anterior fascicular block)    Mitral valve disorder    Moderate MR   Nephrolithiasis 2013   Pneumonia 1938; ?date   "once as a child; once as an adult" (04/17/2018)   PSVT (paroxysmal supraventricular tachycardia) ([place]) 06/2016    Past Surgical History:  Procedure Laterality Date   CARDIAC CATHETERIZATION     CATARACT EXTRACTION W/ INTRAOCULAR LENS  IMPLANT, BILATERAL Bilateral 2013   SKIN BIOPSY Left    "thigh; suspected CA; it was not"     Home Medications:  Prior to Admission medications    Medication Sig Start Date End Date Taking? Authorizing Provider  acetaminophen (TYLENOL) 500 MG tablet Take 1,000 mg by mouth daily as needed for headache (pain).    Yes [provider]  aspirin EC 81 MG tablet Take 81 mg by mouth daily. Swallow whole.   Yes [provider]  B Complex-C (SUPER B COMPLEX PO) Take 1 tablet by mouth every evening.   Yes [provider]  Calcium Carb-Cholecalciferol (CALCIUM 500 +D PO) Take 500 mg by mouth every evening.   Yes [provider]  cephALEXin (KEFLEX) 500 MG capsule Take 500 mg by mouth 2 (two) times daily.    Yes [provider]  cholecalciferol (VITAMIN D3) 25 MCG (1000 UNIT) tablet Take 1,000 Units by mouth every evening.    Yes [provider]  finasteride (PROSCAR) 5 MG tablet Take 5 mg by mouth daily. 07/25/13  Yes [provider]  flecainide (TAMBOCOR) 50 MG tablet Take 1 tablet (50 mg total) by mouth every 12 (twelve) hours. 11/20/18  Yes Kilroy, Luke K, PA-C  fluticasone (FLONASE) 50 MCG/ACT nasal spray Place 1-2 sprays into both nostrils daily as needed for allergies or rhinitis.   Yes [provider]  furosemide (LASIX) 20 MG tablet TAKE 1 TABLET EVERY DAY Patient taking differently: Take 20 mg by mouth See admin instructions. Take one tablet (20 mg) by mouth daily Monday thru Saturday (skip Sunday) 06/08/18  Yes Lorretta Harp, MD  Krill Oil 500 MG CAPS Take 500 mg by mouth every evening.   Yes [provider]  Lutein 20 MG CAPS Take 20 mg by mouth every evening.    Yes [provider]  metoprolol tartrate (LOPRESSOR) 25 MG tablet TAKE 1 TABLET BY MOUTH THREE TIMES A DAY Patient taking differently: 12.5 mg 2 (two) times daily.  01/15/19  Yes Kilroy, Luke K, PA-C  Multiple Vitamins-Minerals (OCUVITE EYE HEALTH FORMULA) CAPS Take 1 capsule by mouth every evening.    Yes [provider]  naproxen sodium (ALEVE) 220 MG tablet Take 220 mg by mouth 2  (two) times daily as needed (pain).   Yes [provider]  polyvinyl alcohol (ARTIFICIAL TEARS) 1.4 % ophthalmic solution Place 1 drop into both eyes daily as needed for dry eyes.   Yes [provider]  senna (SENOKOT) 8.6 MG TABS tablet Take 1 tablet by mouth every evening.   Yes [provider]  vitamin C (ASCORBIC ACID) 500 MG tablet Take 500 mg by mouth every evening.    Yes [provider]    Inpatient Medications: Scheduled Meds:  apixaban  2.5 mg Oral BID   vitamin C  500 mg Oral QPM   calcium-vitamin D  1 tablet Oral QPM   cephALEXin  500 mg Oral BID   cholecalciferol  1,000  Units Oral QPM   finasteride  5 mg Oral Daily   furosemide  40 mg Oral Daily   metoprolol tartrate  25 mg Oral BID   multivitamin  1 tablet Oral Daily   senna  1 tablet Oral QPM   sodium chloride flush  3 mL Intravenous Q12H   Continuous Infusions:  sodium chloride     PRN Meds: sodium chloride, acetaminophen, fluticasone, Muscle Rub, ondansetron (ZOFRAN) IV, polyvinyl alcohol, sodium chloride flush  Allergies:    Allergies  Allergen Reactions   Rocephin [Ceftriaxone Sodium In Dextrose] Nausea And Vomiting and Other (See Comments)     Hallucinations  Tolerated Keflex course 09/2019   Barbital Swelling, Rash and Other (See Comments)    Blue lips and swollen tongue    Social History:   Social History   Socioeconomic History   Marital status: Married    Spouse name: Not on file   Number of children: Not on file   Years of education: Not on file   Highest education level: Not on file  Occupational History   Not on file  Tobacco Use   Smoking status: Former Smoker   Smokeless tobacco: Never Used   Tobacco comment: 04/17/2018 "quit smoking when I was 14"  Vaping Use   Vaping Use: Never used  Substance and Sexual Activity   Alcohol use: Yes    Alcohol/week: 3.0 standard drinks    Types: 3 Glasses of wine per week   Drug use: Never   Sexual activity: Not  on file  Other Topics Concern   Not on file  Social History Narrative   Not on file   Social Determinants of Health   Financial Resource Strain:    Difficulty of Paying Living Expenses:   Food Insecurity:    Worried About Charity fundraiser in the Last Year:    Arboriculturist in the Last Year:   Transportation Needs:    Film/video editor (Medical):    Lack of Transportation (Non-Medical):   Physical Activity:    Days of Exercise per Week:    Minutes of Exercise per Session:   Stress:    Feeling of Stress :   Social Connections:    Frequency of Communication with Friends and Family:    Frequency of Social Gatherings with Friends and Family:    Attends Religious Services:    Active Member of Clubs or Organizations:    Attends Music therapist:    Marital Status:   Intimate Partner Violence:    Fear of Current or Ex-Partner:    Emotionally Abused:    Physically Abused:    Sexually Abused:     Family History:   Family History  Problem Relation Age of Onset   Cancer Mother    Pneumonia Father      ROS:  Please see the history of present illness.  All other ROS reviewed and negative.     Physical Exam/Data:   Vitals:   09/24/19 2305 09/25/19 0346 09/25/19 0900 09/25/19 1147  BP: 115/80 (!) 147/97 (!) 117/91 102/81  Pulse: (!) 101  (!) 112 100  Resp: 20 18 16 16   Temp: 98.5 F (36.9 C) 98 F (36.7 C) 98.6 F (37 C) 98.6 F (37 C)  TempSrc: Oral Oral Oral Oral  SpO2: 98% 94% 97% 96%  Weight:  78.4 kg    Height:        Intake/Output Summary (Last 24 hours) at 09/25/2019 1414 Last  data filed at 09/25/2019 0401 Gross per 24 hour  Intake --  Output 1500 ml  Net -1500 ml   Last 3 Weights 09/25/2019 09/24/2019 09/23/2019  Weight (lbs) 172 lb 13.5 oz 177 lb 0.5 oz 176 lb 5.9 oz  Weight (kg) 78.4 kg 80.3 kg 80 kg     Body mass index is 27.9 kg/m.  General:  Well nourished, well developed, in no acute distress HEENT: normal Lymph: no  adenopathy Neck: no JVD Endocrine:  No thryomegaly Vascular: No carotid bruits Cardiac:  irreg-irreg; soft SM, no gallops or rubs Lungs:  Diminished at the bases, no wheezing, rhonchi or rales  Abd: soft, nontender  Ext: trace-1+ edema b/l LE Musculoskeletal:  No deformities, age appropriate atrophy Skin: warm and dry  Neuro:  no focal abnormalities noted Psych:  Normal affect   EKG:  The EKG was personally reviewed and demonstrates:    Irregular, looks Afib, LBBB/IVCD, LAD  08/06/2019, SB 55bpm, 1st degree Avblock PR 243ms, LBBB, LAD 11/05/2018 SR 66bpm, 1st degree Avblock, PR 276ms, , LBBB, LAD  Telemetry:  Telemetry was personally reviewed and demonstrates:   Afib 90's-110's  Relevant CV Studies:  09/24/2019; TTE IMPRESSIONS  1. Left ventricular ejection fraction, by estimation, is 35 to 40%. The  left ventricle has moderately decreased function. The left ventricle  demonstrates global hypokinesis. Left ventricular diastolic parameters are  consistent with Grade II diastolic  dysfunction (pseudonormalization).   2. Right ventricular systolic function is moderately reduced. The right  ventricular size is moderately enlarged. There is severely elevated  pulmonary artery systolic pressure. The estimated right ventricular  systolic pressure is 30.8 mmHg.   3. Left atrial size was severely dilated.   4. Right atrial size was severely dilated.   5. Suspect ruptured chorda(e) tendina(e) to the middle scallop of the  posterior mitral leaflet. The severity of the mitral insufficiency jet may  be underestimated due to its highly eccentric nature. The mitral valve is  myxomatous. Moderate mitral valve  regurgitation.   6. Tricuspid valve regurgitation is mild to moderate.   7. The aortic valve is tricuspid. Aortic valve regurgitation is mild to  moderate.   8. The inferior vena cava is dilated in size with <50% respiratory  variability, suggesting right atrial pressure of 15 mmHg.    Comparison(s): Prior images reviewed side by side. The left ventricular  function is significantly worse.   TTE: EF 55-60%, moderate MR, moderate pulmonary hypertension  Laboratory Data:  High Sensitivity Troponin:   Recent Labs  Lab 09/22/19 1223 09/22/19 1416  TROPONINIHS 46* 49*     Chemistry Recent Labs  Lab 09/23/19 0447 09/24/19 0230 09/25/19 0336  NA 137 139 141  K 3.7 3.8 4.5  CL 102 102 102  CO2 26 27 29   GLUCOSE 105* 104* 108*  BUN 40* 38* 32*  CREATININE 1.76* 1.55* 1.39*  CALCIUM 8.4* 8.5* 9.2  GFRNONAA 32* 37* 43*  GFRAA 37* 43* 50*  ANIONGAP 9 10 10     No results for input(s): PROT, ALBUMIN, AST, ALT, ALKPHOS, BILITOT in the last 168 hours. Hematology Recent Labs  Lab 09/22/19 1233 09/24/19 0230  WBC 16.8* 7.8  RBC 3.79* 3.38*  HGB 12.8* 11.4*  HCT 40.0 34.7*  MCV 105.5* 102.7*  MCH 33.8 33.7  MCHC 32.0 32.9  RDW 14.6 14.1  PLT 171 200   BNP Recent Labs  Lab 09/22/19 1223  BNP 1,552.3*    DDimer No results for input(s): DDIMER in the last  168 hours.   Radiology/Studies:   DG Chest Portable 1 View Result Date: 09/22/2019 CLINICAL DATA:  Shortness of breath, decreased urine output EXAM: PORTABLE CHEST 1 VIEW COMPARISON:  04/16/2018 FINDINGS: Stable cardiomegaly without CHF or pneumonia. Negative for edema, effusion or pneumothorax. Trachea midline. Aorta atherosclerotic. IMPRESSION: Stable cardiomegaly without acute chest process or interval change. Aortic Atherosclerosis (ICD10-I70.0). Electronically Signed   By: Jerilynn Mages.  Shick M.D.   On: 09/22/2019 13:05     Assessment and Plan:   1. SVT hx 2. New Afib 3. Baseline LBBB, 1st degree Avblock      CHA2DS2Vasc is 3, started on Eliquis this admission  New CM, LVEF 35-40%, RVEF is also mod reduced Severe elevated pulm pressures LA described as severely enlarged Known longstanding MV disease Echo notes Suspect ruptured chorda(e) tendina(e) to the middle scallop of the  posterior  mitral leaflet. The severity of the mitral insufficiency jet may  be underestimated due to its highly eccentric nature. The mitral valve is  myxomatous. Moderate mitral valve  regurgitation.   Agree with stopping Flecainide.  He is generally feeling tired, no energy, weak.  Iikely multifactorial, AFib probably contributes  Rhythm control may prove challenging given his echo findings/MR Dr. Marlou Porch mentioned he Ulyess Muto be discussing case with structural heart team   Could try DCCV  Discussed with patient TEE/DCCV vs 3 weeks of a/c then DCCV No AAD without TEE given he has been in Afib probably at least a week or longer Given baseline conduction system disease, uncertain, which AAD choices, ?Tikosyn ?amiodarone Agree with slight up-titration of metoprolol for now  Dr. Curt Bears Allis Quirarte see the patient later today for final recommendations at this juncture   4. New cardiomyopathy 5. CHF (systolic/diastolic)     W/u and management deferred to cardiology team      For questions or updates, please contact [place] Please consult [email or web address] for contact info under    Signed, Baldwin Jamaica, PA-C  09/25/2019 2:14 PM  I have seen and examined this patient with Tommye Standard.  Agree with above, note added to reflect my findings.  On exam, tachycardic, irregular, no murmurs, lungs clear.   Patient admitted to the hospital with atrial fibrillation.  He has a history of SVT and had previously been on flecainide.  He has weakness and fatigue and was found to have a low ejection fraction.  This is potentially due to rapid atrial fibrillation and a tachycardia mediated cardiomyopathy.  Agree that he Jerame Hedding need resumption of sinus rhythm.  This is a little bit tricky as Eliquis has just been started and he is 89.  If he can tolerate it, a TEE and cardioversion would be an acceptable option.  I do think that he Almer Bushey need rhythm control post procedure.  Seems that amiodarone at this time is likely his  best option.  Vyron Fronczak M. Loreley Schwall MD 09/26/2019 6:28 AM

## 2019-09-25 NOTE — Progress Notes (Signed)
PT Cancellation Note  Patient Details Name: Travis Kelly MRN: [id number] DOB: 03-Jul-1922   Cancelled Treatment:    Reason Eval/Treat Not Completed: Other (comment).  Per family request holding PT today.  Cardiology makes note of pt feeling weak, no new orders regarding activity were on the chart.   [name] 09/25/2019, 4:23 PM   Mee Hives, PT MS Acute Rehab Dept. Number: [place]

## 2019-09-25 NOTE — Consult Note (Signed)
[facility]   MULTIDISCIPLINARY HEART VALVE TEAM  Date:  09/25/2019   ID:  Travis Kelly, DOB 25-Oct-1922, MRN [id number]  PCP:  [name], MD   Chief Complaint  Patient presents with  . Urinary Retention  . Shortness of Breath     HISTORY OF PRESENT ILLNESS: Travis Kelly is a 86 y.o. male who presents for evaluation of mitral regurgitation, referred by Dr Marlou Porch.  The patient is hospitalized with acute on chronic diastolic heart failure. He was under evaluation for a UTI and was noted to have a rapid heart rate, diagnosed with atrial fibrillation with RVR. He has been treated for heart failure with IV diuretics and rate-controlled for atrial fibrillation. He is noted to have severe LV dysfunction with past hx of LVEF 55-60%, now in the 35% range in the setting of rapid AF. Structural heart consultation is requested to evaluate treatment options for severe mitral regurgitation.   The patient has a longstanding heart murmur. He is a widower for over a year now. 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 He now lives at [employer] [facility]. He has assistance but is fairly independent. He is quite limited by arthritis and uses a walker or an electric wheel chair most of the time. He has been feeling poorly for a few weeks now and complains of dyspnea, leg swelling, and weakness. No chest pain.   Past Medical History:  Diagnosis Date  . Age-related macular degeneration, dry, left eye   . Age-related macular degeneration, wet, right eye ([place])   . Arthritis    "right knee" (04/17/2018)  . CHF (congestive heart failure) ([place])   . GERD (gastroesophageal reflux disease)   . Heart murmur   . History of duodenal ulcer   . History of hiatal hernia   . LAFB (left anterior fascicular block)   . Mitral valve disorder    Moderate MR  . Nephrolithiasis 2013  . Pneumonia 1938; ?date   "once as a child; once  as an adult" (04/17/2018)  . PSVT (paroxysmal supraventricular tachycardia) ([place]) 06/2016    Current Facility-Administered Medications  Medication Dose Route Frequency Provider Last Rate Last Admin  . 0.9 %  sodium chloride infusion  250 mL Intravenous PRN Etta Quill, DO      . acetaminophen (TYLENOL) tablet 650 mg  650 mg Oral Q4H PRN Etta Quill, DO   650 mg at 09/25/19 0913  . apixaban (ELIQUIS) tablet 2.5 mg  2.5 mg Oral BID Furth, Cadence H, PA-C   2.5 mg at 09/25/19 0912  . ascorbic acid (VITAMIN C) tablet 500 mg  500 mg Oral QPM Jennette Kettle M, DO   500 mg at 09/25/19 1602  . calcium-vitamin D (OSCAL WITH D) 500-200 MG-UNIT per tablet 1 tablet  1 tablet Oral QPM Etta Quill, DO   1 tablet at 09/25/19 1602  . cephALEXin (KEFLEX) capsule 500 mg  500 mg Oral BID Jennette Kettle M, DO   500 mg at 09/25/19 0912  . cholecalciferol (VITAMIN D3) tablet 1,000 Units  1,000 Units Oral QPM Etta Quill, DO   1,000 Units at 09/25/19 1603  . finasteride (PROSCAR) tablet 5 mg  5 mg Oral Daily Jennette Kettle M, DO   5 mg at 09/24/19 2043  . fluticasone (FLONASE) 50 MCG/ACT nasal spray 1-2 spray  1-2 spray Each Nare Daily PRN Dorie Rank, MD      .  furosemide (LASIX) tablet 40 mg  40 mg Oral Daily Jerline Pain, MD   40 mg at 09/25/19 0912  . metoprolol tartrate (LOPRESSOR) tablet 25 mg  25 mg Oral BID Jerline Pain, MD      . multivitamin (PROSIGHT) tablet 1 tablet  1 tablet Oral Daily Etta Quill, DO   1 tablet at 09/25/19 0913  . Muscle Rub CREA   Topical PRN Jonetta Osgood, MD   Given at 09/25/19 [phone]  . ondansetron (ZOFRAN) injection 4 mg  4 mg Intravenous Q6H PRN Etta Quill, DO      . polyvinyl alcohol (LIQUIFILM TEARS) 1.4 % ophthalmic solution 1 drop  1 drop Both Eyes Daily PRN Etta Quill, DO      . senna (SENOKOT) tablet 8.6 mg  1 tablet Oral QPM Jennette Kettle M, DO   8.6 mg at 09/25/19 1602  . sodium chloride flush (NS) 0.9 % injection 3 mL  3 mL  Intravenous Q12H Jennette Kettle M, DO   3 mL at 09/25/19 0913  . sodium chloride flush (NS) 0.9 % injection 3 mL  3 mL Intravenous PRN Etta Quill, DO        ALLERGIES:   Rocephin [ceftriaxone sodium in dextrose] and Barbital   SOCIAL HISTORY:  The patient  reports that he has quit smoking. He has never used smokeless tobacco. He reports current alcohol use of about 3.0 standard drinks of alcohol per week. He reports that he does not use drugs.   FAMILY HISTORY:  The patient's family history includes Cancer in his mother; Pneumonia in his father.   REVIEW OF SYSTEMS:  Positive for fatigue, arthritis, leg weakness, urinary retention.   All other systems are reviewed and negative.   PHYSICAL EXAM: VS:  BP 109/78 (BP Location: Right Arm)   Pulse 89   Temp 98.6 F (37 C) (Oral)   Resp 19   Ht 5\' 6"  (1.676 m)   Wt 78.4 kg   SpO2 95%   BMI 27.90 kg/m  , BMI Body mass index is 27.9 kg/m. GEN: Elderly male in no acute distress HEENT: normal Neck: moderately elevated JVP.Marland Kitchen carotids 2+ without bruits or masses Cardiac: tachy and irregular with a 3/6 holosystolic murmur at the apex. No edema. Pedal pulses 2+ = bilaterally  Respiratory:  clear to auscultation bilaterally GI: soft, nontender, nondistended, + BS MS: no deformity or atrophy Skin: warm and dry, no rash Neuro:  Strength and sensation are intact Psych: euthymic mood, full affect  RECENT LABS: 10/03/2018: NT-Pro BNP 4,125 09/22/2019: B Natriuretic Peptide 1,552.3 09/24/2019: Hemoglobin 11.4; Platelets 200 09/25/2019: BUN 32; Creatinine, Ser 1.39; Potassium 4.5; Sodium 141  No results found for requested labs within last 8760 hours.   Estimated Creatinine Clearance: 31.3 mL/min (A) (by C-G formula based on SCr of 1.39 mg/dL (H)).   Wt Readings from Last 3 Encounters:  09/25/19 78.4 kg  08/06/19 75.3 kg  02/18/19 74.8 kg     CARDIAC STUDIES:  Echo: IMPRESSIONS    1. Left ventricular ejection fraction, by  estimation, is 35 to 40%. The  left ventricle has moderately decreased function. The left ventricle  demonstrates global hypokinesis. Left ventricular diastolic parameters are  consistent with Grade II diastolic  dysfunction (pseudonormalization).  2. Right ventricular systolic function is moderately reduced. The right  ventricular size is moderately enlarged. There is severely elevated  pulmonary artery systolic pressure. The estimated right ventricular  systolic pressure is 96.0 mmHg.  3. Left atrial size was severely dilated.  4. Right atrial size was severely dilated.  5. Suspect ruptured chorda(e) tendina(e) to the middle scallop of the  posterior mitral leaflet. The severity of the mitral insufficiency jet may  be underestimated due to its highly eccentric nature. The mitral valve is  myxomatous. Moderate mitral valve  regurgitation.  6. Tricuspid valve regurgitation is mild to moderate.  7. The aortic valve is tricuspid. Aortic valve regurgitation is mild to  moderate.  8. The inferior vena cava is dilated in size with <50% respiratory  variability, suggesting right atrial pressure of 15 mmHg.   Comparison(s): Prior images reviewed side by side. The left ventricular  function is significantly worse.   ASSESSMENT AND PLAN: 86 yo male with acute on chronic systolic heart failure in the setting of newly diagnosed atrial fibrillation with RVR and longstanding moderate-severe MR. 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 He has appropriately been treated conservatively in the context of his very advanced age. It is possible that the development of atrial fibrillation is related to the presence of severe mitral regurgitation and left atrial dilatation.   I have reviewed the natural history of mitral regurgitation with the patient and their  family members who are present today. His daughters are present at the bedside, and his son-in-law, Margarita Sermons, is conferenced in over the phone.  We have discussed the limitations of medical therapy and the poor prognosis associated with symptomatic mitral regurgitation. We have also reviewed potential treatment options, including palliative medical therapy, conventional surgical mitral valve repair or replacement, and percutaneous mitral valve therapies such as edge-to-edge mitral valve approximation with MitraClip. We discussed treatment options in the context of this patient's specific comorbid medical conditions.   Considering the patient's frailty and very advanced age, I think he would be best treated conservatively. It would be reasonable to consider rhythm control of atrial fibrillation and possibly TEE-cardioversion, but will defer to the EP and primary cardiology teams. I would not favor moving forward with evaluation for transcatheter mitral valve edge to edge repair with MitraClip at this point. The patient and family are in agreement.   Deatra James 09/25/2019 9:51 PM     [facility] [address] [place] [address]  [phone] (office) [phone] (fax)

## 2019-09-25 NOTE — Progress Notes (Signed)
Progress Note  Patient Name: Travis Kelly Date of Encounter: 09/25/2019  [facility] Cardiologist: Quay Burow, MD   Subjective   New development demonstrated echocardiogram with reduced ejection fraction 35 to 40% with at least moderate mitral regurgitation possibly worse with dilated left atrium contributing to symptoms, atrial fibrillation.  Resting fairly comfortably in bed.  Inpatient Medications    Scheduled Meds: . apixaban  2.5 mg Oral BID  . vitamin C  500 mg Oral QPM  . calcium-vitamin D  1 tablet Oral QPM  . cephALEXin  500 mg Oral BID  . cholecalciferol  1,000 Units Oral QPM  . finasteride  5 mg Oral Daily  . furosemide  40 mg Oral Daily  . metoprolol tartrate  12.5 mg Oral BID  . multivitamin  1 tablet Oral Daily  . senna  1 tablet Oral QPM  . sodium chloride flush  3 mL Intravenous Q12H   Continuous Infusions: . sodium chloride     PRN Meds: sodium chloride, acetaminophen, fluticasone, Muscle Rub, ondansetron (ZOFRAN) IV, polyvinyl alcohol, sodium chloride flush   Vital Signs    Vitals:   09/24/19 1651 09/24/19 2008 09/24/19 2305 09/25/19 0346  BP: 118/89 120/89 115/80 (!) 147/97  Pulse: 99 99 (!) 101   Resp: 20 19 20 18   Temp: 98.2 F (36.8 C) 98.7 F (37.1 C) 98.5 F (36.9 C) 98 F (36.7 C)  TempSrc: Oral Oral Oral Oral  SpO2:  96% 98% 94%  Weight:    78.4 kg  Height:        Intake/Output Summary (Last 24 hours) at 09/25/2019 0817 Last data filed at 09/25/2019 0401 Gross per 24 hour  Intake --  Output 2200 ml  Net -2200 ml   Last 3 Weights 09/25/2019 09/24/2019 09/23/2019  Weight (lbs) 172 lb 13.5 oz 177 lb 0.5 oz 176 lb 5.9 oz  Weight (kg) 78.4 kg 80.3 kg 80 kg      Telemetry    Atrial fibrillation, this morning heart rates in the 130s, mostly around 100 at night- Personally Reviewed  ECG    Baseline ECG shows first-degree AV block with left bundle branch block, currently A. fib with left bundle branch block- Personally  Reviewed  Physical Exam   GEN: No acute distress.   Neck: No JVD Cardiac: Irregularly irregular, mildly tachycardic 2/6 holosystolic murmur, no rubs, or gallops.  Respiratory: Clear to auscultation bilaterally. GI: Soft, nontender, non-distended  MS: No edema; No deformity. Neuro:  Nonfocal  Psych: Normal affect   Labs    High Sensitivity Troponin:   Recent Labs  Lab 09/22/19 1223 09/22/19 1416  TROPONINIHS 46* 49*      Chemistry Recent Labs  Lab 09/23/19 0447 09/24/19 0230 09/25/19 0336  NA 137 139 141  K 3.7 3.8 4.5  CL 102 102 102  CO2 26 27 29   GLUCOSE 105* 104* 108*  BUN 40* 38* 32*  CREATININE 1.76* 1.55* 1.39*  CALCIUM 8.4* 8.5* 9.2  GFRNONAA 32* 37* 43*  GFRAA 37* 43* 50*  ANIONGAP 9 10 10      Hematology Recent Labs  Lab 09/22/19 1233 09/24/19 0230  WBC 16.8* 7.8  RBC 3.79* 3.38*  HGB 12.8* 11.4*  HCT 40.0 34.7*  MCV 105.5* 102.7*  MCH 33.8 33.7  MCHC 32.0 32.9  RDW 14.6 14.1  PLT 171 200    BNP Recent Labs  Lab 09/22/19 1223  BNP 1,552.3*     DDimer No results for input(s): DDIMER in the  last 168 hours.   Radiology    ECHOCARDIOGRAM COMPLETE  Result Date: 09/24/2019    ECHOCARDIOGRAM REPORT   Patient Name:   HELTON OLESON Date of Exam: 09/24/2019 Medical Rec #:  [id number]      Height:       66.0 in Accession #:    [id number]     Weight:       177.0 lb Date of Birth:  [birth year]       BSA:          1.899 m Patient Age:    86 years       BP:           113/80 mmHg Patient Gender: M              HR:           97 bpm. Exam Location:  Inpatient Procedure: 2D Echo, Cardiac Doppler and Color Doppler Indications:    CHF-Acute Diastolic 124.58 / K99.83  History:        Patient has prior history of Echocardiogram examinations, most                 recent 04/17/2018. CHF, Arrythmias:Atrial Fibrillation and LBBB;                 Risk Factors:Dyslipidemia and Former Smoker. PSVT. MR. DOE.                 Elevated troponin.  Sonographer:    Vickie Epley  RDCS Referring Phys: [place]  1. Left ventricular ejection fraction, by estimation, is 35 to 40%. The left ventricle has moderately decreased function. The left ventricle demonstrates global hypokinesis. Left ventricular diastolic parameters are consistent with Grade II diastolic dysfunction (pseudonormalization).  2. Right ventricular systolic function is moderately reduced. The right ventricular size is moderately enlarged. There is severely elevated pulmonary artery systolic pressure. The estimated right ventricular systolic pressure is 38.2 mmHg.  3. Left atrial size was severely dilated.  4. Right atrial size was severely dilated.  5. Suspect ruptured chorda(e) tendina(e) to the middle scallop of the posterior mitral leaflet. The severity of the mitral insufficiency jet may be underestimated due to its highly eccentric nature. The mitral valve is myxomatous. Moderate mitral valve regurgitation.  6. Tricuspid valve regurgitation is mild to moderate.  7. The aortic valve is tricuspid. Aortic valve regurgitation is mild to moderate.  8. The inferior vena cava is dilated in size with <50% respiratory variability, suggesting right atrial pressure of 15 mmHg. Comparison(s): Prior images reviewed side by side. The left ventricular function is significantly worse. FINDINGS  Left Ventricle: Left ventricular ejection fraction, by estimation, is 35 to 40%. The left ventricle has moderately decreased function. The left ventricle demonstrates global hypokinesis. The left ventricular internal cavity size was normal in size. There is no left ventricular hypertrophy. Left ventricular diastolic parameters are consistent with Grade II diastolic dysfunction (pseudonormalization). Normal left ventricular filling pressure. Right Ventricle: The right ventricular size is moderately enlarged. No increase in right ventricular wall thickness. Right ventricular systolic function is moderately reduced. There is  severely elevated pulmonary artery systolic pressure. The tricuspid regurgitant velocity is 3.43 m/s, and with an assumed right atrial pressure of 15 mmHg, the estimated right ventricular systolic pressure is 50.5 mmHg. Left Atrium: Left atrial size was severely dilated. Right Atrium: Right atrial size was severely dilated. Pericardium: There is no evidence of pericardial effusion. Mitral Valve: Suspect ruptured chorda(e) tendina(e)  to the middle scallop of the posterior mitral leaflet. The severity of the mitral insufficiency jet may be underestimated due to its highly eccentric nature. The mitral valve is myxomatous. Mild to moderate mitral annular calcification. Moderate mitral valve regurgitation, with anteriorly-directed jet. Tricuspid Valve: The tricuspid valve is normal in structure. Tricuspid valve regurgitation is mild to moderate. Aortic Valve: The aortic valve is tricuspid. . There is moderate thickening and mild calcification of the aortic valve. Aortic valve regurgitation is mild to moderate. Aortic regurgitation PHT measures 417 msec. There is moderate thickening of the aortic  valve. There is mild calcification of the aortic valve. Pulmonic Valve: The pulmonic valve was normal in structure. Pulmonic valve regurgitation is mild. Aorta: The aortic root is normal in size and structure. Venous: The inferior vena cava is dilated in size with less than 50% respiratory variability, suggesting right atrial pressure of 15 mmHg. IAS/Shunts: No atrial level shunt detected by color flow Doppler.  LEFT VENTRICLE PLAX 2D LVIDd:         5.00 cm LVIDs:         4.50 cm LV PW:         1.10 cm LV IVS:        1.10 cm LVOT diam:     1.90 cm LV SV:         25 LV SV Index:   13 LVOT Area:     2.84 cm  LV Volumes (MOD) LV vol d, MOD A2C: 111.0 ml LV vol d, MOD A4C: 112.0 ml LV vol s, MOD A2C: 86.7 ml LV vol s, MOD A4C: 64.6 ml LV SV MOD A2C:     24.3 ml LV SV MOD A4C:     112.0 ml LV SV MOD BP:      38.1 ml RIGHT VENTRICLE  TAPSE (M-mode): 0.8 cm LEFT ATRIUM             Index       RIGHT ATRIUM           Index LA diam:        5.90 cm 3.11 cm/m  RA Area:     20.30 cm LA Vol (A2C):   78.5 ml 41.34 ml/m RA Volume:   58.40 ml  30.76 ml/m LA Vol (A4C):   71.4 ml 37.60 ml/m LA Biplane Vol: 77.3 ml 40.71 ml/m  AORTIC VALVE LVOT Vmax:   56.40 cm/s LVOT Vmean:  40.600 cm/s LVOT VTI:    0.088 m AI PHT:      417 msec  AORTA Ao Root diam: 3.40 cm TRICUSPID VALVE TR Peak grad:   47.1 mmHg TR Vmax:        343.00 cm/s  SHUNTS Systemic VTI:  0.09 m Systemic Diam: 1.90 cm Mihai Croitoru MD Electronically signed by Sanda Klein MD Signature Date/Time: 09/24/2019/1:12:43 PM    Final     Cardiac Studies   Echo as above  Patient Profile     86 y.o. male with newly discovered atrial fibrillation, prior PSVT treated with flecainide low-dose with underlying first-degree AV block and left bundle branch block with newly discovered cardiomyopathy  Assessment & Plan    Acute systolic heart failure, cardiomyopathy -Possibly secondary to mitral regurgitation/atrial fibrillation with rapid ventricular response -Currently on low-dose metoprolol, challenging to increase secondary to hypotension -Given newly discovered EF of 35 to 40%, we will stop flecainide.  Mitral regurgitation -At least moderate, eccentric.  Could be worse.  Will get structural heart team evaluation.  Question if he could be a candidate for potential mitral clip procedure.  Would obviously need transesophageal echocardiogram if decided to move forward. -He would be at increased risk with his reduction in ejection fraction as well.  There is likely a degree of secondary MR as well based upon his reduction in EF although primary MR, leaflet failure noted on echo.  Atrial fibrillation persistent -Newly discovered this admission.  We will stop flecainide given reduction in EF.  Likely driven by mild regurgitation and severe left atrial enlargement.  It is likely that  cardioversion would lead to short-lived effects and return of atrial fibrillation given underlying structural abnormalities. -Utilization of amiodarone would be of high risk given his underlying conduction disease.  Will ask EP team/Dr. Allred who has seen him in the past to weigh in.  Chronic kidney disease stage IIIa -Prior creatinine last year was 1.24-1.39.  Currently it is decreasing.  Perhaps responding to diuresis, unloading.  May need to dose adjust Eliquis once settles out.  Discussed both structural heart and EP consultation with Katheren Puller (retired APP with [employer], MD), [name] and patient.       For questions or updates, please contact [place] Please consult [email or web address] for contact info under        Signed, Candee Furbish, MD  09/25/2019, 8:17 AM

## 2019-09-26 LAB — BASIC METABOLIC PANEL
Anion gap: 12 (ref 5–15)
BUN: 28 mg/dL — ABNORMAL HIGH (ref 8–23)
CO2: 27 mmol/L (ref 22–32)
Calcium: 8.7 mg/dL — ABNORMAL LOW (ref 8.9–10.3)
Chloride: 101 mmol/L (ref 98–111)
Creatinine, Ser: 1.37 mg/dL — ABNORMAL HIGH (ref 0.61–1.24)
GFR calc Af Amer: 50 mL/min — ABNORMAL LOW (ref >60)
GFR calc non Af Amer: 44 mL/min — ABNORMAL LOW (ref >60)
Glucose, Bld: 119 mg/dL — ABNORMAL HIGH (ref 70–99)
Potassium: 3.6 mmol/L (ref 3.5–5.1)
Sodium: 140 mmol/L (ref 135–145)

## 2019-09-26 MED ORDER — METOPROLOL TARTRATE 50 MG PO TABS
50.0000 mg | ORAL_TABLET | Freq: Two times a day (BID) | ORAL | Status: DC
  Administered 2019-09-26 – 2019-09-27 (×2): 50 mg via ORAL
  Filled 2019-09-26 (×2): qty 1

## 2019-09-26 MED ORDER — AMIODARONE HCL 200 MG PO TABS
200.0000 mg | ORAL_TABLET | Freq: Every day | ORAL | Status: DC
  Administered 2019-09-26 – 2019-09-27 (×2): 200 mg via ORAL
  Filled 2019-09-26 (×2): qty 1

## 2019-09-26 MED ORDER — APIXABAN 5 MG PO TABS
5.0000 mg | ORAL_TABLET | Freq: Two times a day (BID) | ORAL | Status: DC
  Administered 2019-09-26 – 2019-09-27 (×2): 5 mg via ORAL
  Filled 2019-09-26 (×2): qty 1

## 2019-09-26 MED ORDER — APIXABAN 2.5 MG PO TABS
2.5000 mg | ORAL_TABLET | Freq: Once | ORAL | Status: AC
  Administered 2019-09-26: 2.5 mg via ORAL
  Filled 2019-09-26: qty 1

## 2019-09-26 NOTE — Progress Notes (Addendum)
Electrophysiology Rounding Note  Patient Name: Travis Kelly Date of Encounter: 09/26/2019  Primary Cardiologist: Quay Burow, MD Electrophysiologist: Thompson Grayer, MD   Subjective   The patient is doing better today. HRs varying from 90-120s. Daughter present in room and son called in over phone.   Inpatient Medications    Scheduled Meds: . apixaban  5 mg Oral BID  . vitamin C  500 mg Oral QPM  . calcium-vitamin D  1 tablet Oral QPM  . cholecalciferol  1,000 Units Oral QPM  . finasteride  5 mg Oral Daily  . furosemide  40 mg Oral Daily  . metoprolol tartrate  25 mg Oral BID  . multivitamin  1 tablet Oral Daily  . senna  1 tablet Oral QPM  . sodium chloride flush  3 mL Intravenous Q12H   Continuous Infusions: . sodium chloride     PRN Meds: sodium chloride, acetaminophen, fluticasone, metoprolol tartrate, Muscle Rub, ondansetron (ZOFRAN) IV, polyvinyl alcohol, sodium chloride flush   Vital Signs    Vitals:   09/26/19 0500 09/26/19 0751 09/26/19 1150 09/26/19 1531  BP:  118/75 118/84 119/77  Pulse:  (!) 110 100   Resp:  17 18 16   Temp:  98.5 F (36.9 C) 97.6 F (36.4 C) 97.6 F (36.4 C)  TempSrc:  Oral Oral Oral  SpO2:  94% 91%   Weight: 78.8 kg     Height:        Intake/Output Summary (Last 24 hours) at 09/26/2019 1549 Last data filed at 09/26/2019 1422 Gross per 24 hour  Intake 360 ml  Output 1650 ml  Net -1290 ml   Filed Weights   09/24/19 0345 09/25/19 0346 09/26/19 0500  Weight: 80.3 kg 78.4 kg 78.8 kg    Physical Exam    GEN- The patient is elderly and chronically ill appearing, alert and oriented x 3 today.   Oropharynx- clear Neck- supple Lungs- Clear to ausculation bilaterally Heart- Irregularly irregular and somewhat tachy.  GI- soft, NT, ND, + BS Extremities- no clubbing, cyanosis, or edema Skin- no rash or lesion Psych- euthymic mood, full affect Neuro- strength and sensation are intact  Labs    CBC Recent Labs     09/24/19 0230  WBC 7.8  HGB 11.4*  HCT 34.7*  MCV 102.7*  PLT 376   Basic Metabolic Panel Recent Labs    09/25/19 0336 09/26/19 0305  NA 141 140  K 4.5 3.6  CL 102 101  CO2 29 27  GLUCOSE 108* 119*  BUN 32* 28*  CREATININE 1.39* 1.37*  CALCIUM 9.2 8.7*   Liver Function Tests No results for input(s): AST, ALT, ALKPHOS, BILITOT, PROT, ALBUMIN in the last 72 hours. No results for input(s): LIPASE, AMYLASE in the last 72 hours. Cardiac Enzymes No results for input(s): CKTOTAL, CKMB, CKMBINDEX, TROPONINI in the last 72 hours.   Telemetry    AF with RVR 100-120s (personally reviewed)  Radiology    No results found.  Patient Profile     Travis Kelly is a 86 y.o. 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 Marlou Porch.  Assessment & Plan    1. H/o SVT Had previously  been controlled on flecainide. Now stopped with new cardiomyopathy 2. New Afib He has been started on Eliquis for CHA2DS2Vasc is 3 this admission His renal function has improved and dose adjusted up to 5 mg BID. Will need to follow closely. Continue to titrate lopressor as tolerated. Will add amiodarone 200 mg BID. 3. Baseline LBBB, 1st degree Avblock 4. New cardiomyopathy, combined systolic/diastolic CHF Echo shows new CM, LVEF 35-40%, RVEF is also mod reduced with severe elevated pulm pressures LA described as severely enlarged Known longstanding MV disease Echo notes suspected ruptured chorda(e) tendina(e) to the middle scallop of the  posterior mitral leaflet. The severity of the mitral insufficiency jet may  be underestimated due to its highly eccentric nature. The mitral valve is  myxomatous. Moderate  mitral valve regurgitation.    Rhythm control may prove challenging given his echo findings/MR  Dr. Rayann Heman has seen the patient and discussed further plan. At this time, likely plan discharge home on amiodarone and eliquis, and plan DCCV after 3+ weeks, if remains stable, so that TEE would not be required.   For questions or updates, please contact [facility] Please consult [email or web address] for contact info under Cardiology/STEMI.  Signed, Shirley Friar, PA-C  09/26/2019, 3:49 PM    I have seen, examined the patient, and reviewed the above assessment and plan.  Changes to above are made where necessary.  On exam, iRRR.  In the setting of severe LA enlargement and advanced age/ valvular heart disease, I am concerned that our ability to achieve/ maintain sinus long term is poor.  Ultimately. Rate control may be our only option.  I spoke at length with patient, his daughter, and sin-in-law by phone.  We will plan to initiate amiodarone 200mg  BID and increase metoprolol to 50mg  BID. Hopefully, discharge tomorrow with plans to return after 3 weeks of anticoagulation and amiodarone loading for cardioversion.  I discussed risks of amiodarone at length with the patient and his family today.  They understand that risks include but are not limited to occular, hepatic, pulmonary, and thyroid toxicity and even death.  The patient would like to proceed with amiodarone at this time.  EP to follow Anticipate discharge tomorrow.  Co Sign: Thompson Grayer, MD 09/26/2019 11:19 PM

## 2019-09-26 NOTE — Progress Notes (Signed)
Progress Note  Patient Name: Travis Kelly Date of Encounter: 09/26/2019  [facility] Cardiologist: Quay Burow, MD   Subjective   Laying in bed, fairly comfortable  Inpatient Medications    Scheduled Meds: . apixaban  2.5 mg Oral BID  . vitamin C  500 mg Oral QPM  . calcium-vitamin D  1 tablet Oral QPM  . cholecalciferol  1,000 Units Oral QPM  . finasteride  5 mg Oral Daily  . furosemide  40 mg Oral Daily  . metoprolol tartrate  25 mg Oral BID  . multivitamin  1 tablet Oral Daily  . senna  1 tablet Oral QPM  . sodium chloride flush  3 mL Intravenous Q12H   Continuous Infusions: . sodium chloride     PRN Meds: sodium chloride, acetaminophen, fluticasone, metoprolol tartrate, Muscle Rub, ondansetron (ZOFRAN) IV, polyvinyl alcohol, sodium chloride flush   Vital Signs    Vitals:   09/25/19 2231 09/26/19 0009 09/26/19 0500 09/26/19 0751  BP:  106/73  118/75  Pulse: (!) 110 100  (!) 110  Resp:  19  17  Temp:  98.1 F (36.7 C)  98.5 F (36.9 C)  TempSrc:  Oral  Oral  SpO2: 99% 92%  94%  Weight:   78.8 kg   Height:        Intake/Output Summary (Last 24 hours) at 09/26/2019 1018 Last data filed at 09/26/2019 0953 Gross per 24 hour  Intake 120 ml  Output 1600 ml  Net -1480 ml   Last 3 Weights 09/26/2019 09/25/2019 09/24/2019  Weight (lbs) 173 lb 11.6 oz 172 lb 13.5 oz 177 lb 0.5 oz  Weight (kg) 78.8 kg 78.4 kg 80.3 kg      Telemetry    AFIB 110-140 - Personally Reviewed  ECG    AFIB LBBB, baseline first degree AVB - Personally Reviewed  Physical Exam   GEN: No acute distress.   Neck: No JVD Cardiac: IRRR, 2/6 SM, no rubs, or gallops.  Respiratory: Clear to auscultation bilaterally. GI: Soft, nontender, non-distended  MS: No edema; No deformity. Neuro:  Nonfocal  Psych: Normal affect   Labs    High Sensitivity Troponin:   Recent Labs  Lab 09/22/19 1223 09/22/19 1416  TROPONINIHS 46* 49*      Chemistry Recent Labs  Lab 09/24/19 0230  09/25/19 0336 09/26/19 0305  NA 139 141 140  K 3.8 4.5 3.6  CL 102 102 101  CO2 27 29 27   GLUCOSE 104* 108* 119*  BUN 38* 32* 28*  CREATININE 1.55* 1.39* 1.37*  CALCIUM 8.5* 9.2 8.7*  GFRNONAA 37* 43* 44*  GFRAA 43* 50* 50*  ANIONGAP 10 10 12      Hematology Recent Labs  Lab 09/22/19 1233 09/24/19 0230  WBC 16.8* 7.8  RBC 3.79* 3.38*  HGB 12.8* 11.4*  HCT 40.0 34.7*  MCV 105.5* 102.7*  MCH 33.8 33.7  MCHC 32.0 32.9  RDW 14.6 14.1  PLT 171 200    BNP Recent Labs  Lab 09/22/19 1223  BNP 1,552.3*     DDimer No results for input(s): DDIMER in the last 168 hours.   Radiology    ECHOCARDIOGRAM COMPLETE  Result Date: 09/24/2019    ECHOCARDIOGRAM REPORT   Patient Name:   Travis Kelly Date of Exam: 09/24/2019 Medical Rec #:  [id number]      Height:       66.0 in Accession #:    [id number]     Weight:  177.0 lb Date of Birth:  [birth year]       BSA:          1.899 m Patient Age:    86 years       BP:           113/80 mmHg Patient Gender: M              HR:           97 bpm. Exam Location:  Inpatient Procedure: 2D Echo, Cardiac Doppler and Color Doppler Indications:    CHF-Acute Diastolic 960.45 / W09.81  History:        Patient has prior history of Echocardiogram examinations, most                 recent 04/17/2018. CHF, Arrythmias:Atrial Fibrillation and LBBB;                 Risk Factors:Dyslipidemia and Former Smoker. PSVT. MR. DOE.                 Elevated troponin.  Sonographer:    Vickie Epley RDCS Referring Phys: [place]  1. Left ventricular ejection fraction, by estimation, is 35 to 40%. The left ventricle has moderately decreased function. The left ventricle demonstrates global hypokinesis. Left ventricular diastolic parameters are consistent with Grade II diastolic dysfunction (pseudonormalization).  2. Right ventricular systolic function is moderately reduced. The right ventricular size is moderately enlarged. There is severely elevated pulmonary  artery systolic pressure. The estimated right ventricular systolic pressure is 19.1 mmHg.  3. Left atrial size was severely dilated.  4. Right atrial size was severely dilated.  5. Suspect ruptured chorda(e) tendina(e) to the middle scallop of the posterior mitral leaflet. The severity of the mitral insufficiency jet may be underestimated due to its highly eccentric nature. The mitral valve is myxomatous. Moderate mitral valve regurgitation.  6. Tricuspid valve regurgitation is mild to moderate.  7. The aortic valve is tricuspid. Aortic valve regurgitation is mild to moderate.  8. The inferior vena cava is dilated in size with <50% respiratory variability, suggesting right atrial pressure of 15 mmHg. Comparison(s): Prior images reviewed side by side. The left ventricular function is significantly worse. FINDINGS  Left Ventricle: Left ventricular ejection fraction, by estimation, is 35 to 40%. The left ventricle has moderately decreased function. The left ventricle demonstrates global hypokinesis. The left ventricular internal cavity size was normal in size. There is no left ventricular hypertrophy. Left ventricular diastolic parameters are consistent with Grade II diastolic dysfunction (pseudonormalization). Normal left ventricular filling pressure. Right Ventricle: The right ventricular size is moderately enlarged. No increase in right ventricular wall thickness. Right ventricular systolic function is moderately reduced. There is severely elevated pulmonary artery systolic pressure. The tricuspid regurgitant velocity is 3.43 m/s, and with an assumed right atrial pressure of 15 mmHg, the estimated right ventricular systolic pressure is 47.8 mmHg. Left Atrium: Left atrial size was severely dilated. Right Atrium: Right atrial size was severely dilated. Pericardium: There is no evidence of pericardial effusion. Mitral Valve: Suspect ruptured chorda(e) tendina(e) to the middle scallop of the posterior mitral leaflet.  The severity of the mitral insufficiency jet may be underestimated due to its highly eccentric nature. The mitral valve is myxomatous. Mild to moderate mitral annular calcification. Moderate mitral valve regurgitation, with anteriorly-directed jet. Tricuspid Valve: The tricuspid valve is normal in structure. Tricuspid valve regurgitation is mild to moderate. Aortic Valve: The aortic valve is tricuspid. . There is  moderate thickening and mild calcification of the aortic valve. Aortic valve regurgitation is mild to moderate. Aortic regurgitation PHT measures 417 msec. There is moderate thickening of the aortic  valve. There is mild calcification of the aortic valve. Pulmonic Valve: The pulmonic valve was normal in structure. Pulmonic valve regurgitation is mild. Aorta: The aortic root is normal in size and structure. Venous: The inferior vena cava is dilated in size with less than 50% respiratory variability, suggesting right atrial pressure of 15 mmHg. IAS/Shunts: No atrial level shunt detected by color flow Doppler.  LEFT VENTRICLE PLAX 2D LVIDd:         5.00 cm LVIDs:         4.50 cm LV PW:         1.10 cm LV IVS:        1.10 cm LVOT diam:     1.90 cm LV SV:         25 LV SV Index:   13 LVOT Area:     2.84 cm  LV Volumes (MOD) LV vol d, MOD A2C: 111.0 ml LV vol d, MOD A4C: 112.0 ml LV vol s, MOD A2C: 86.7 ml LV vol s, MOD A4C: 64.6 ml LV SV MOD A2C:     24.3 ml LV SV MOD A4C:     112.0 ml LV SV MOD BP:      38.1 ml RIGHT VENTRICLE TAPSE (M-mode): 0.8 cm LEFT ATRIUM             Index       RIGHT ATRIUM           Index LA diam:        5.90 cm 3.11 cm/m  RA Area:     20.30 cm LA Vol (A2C):   78.5 ml 41.34 ml/m RA Volume:   58.40 ml  30.76 ml/m LA Vol (A4C):   71.4 ml 37.60 ml/m LA Biplane Vol: 77.3 ml 40.71 ml/m  AORTIC VALVE LVOT Vmax:   56.40 cm/s LVOT Vmean:  40.600 cm/s LVOT VTI:    0.088 m AI PHT:      417 msec  AORTA Ao Root diam: 3.40 cm TRICUSPID VALVE TR Peak grad:   47.1 mmHg TR Vmax:        343.00  cm/s  SHUNTS Systemic VTI:  0.09 m Systemic Diam: 1.90 cm Mihai Croitoru MD Electronically signed by Sanda Klein MD Signature Date/Time: 09/24/2019/1:12:43 PM    Final     Cardiac Studies   ECHO as above  Patient Profile     86 y.o. male newly discovered atrial fibrillation, prior PSVT treated with flecainide low-dose with underlying first-degree AV block and left bundle branch block with newly discovered cardiomyopathy  Assessment & Plan    Acute systolic HF  - possible tachy mediated  - MR as well  - Stopped flecainide   - On lasix 40 QD  - Metoprolol 25 BID mildly increased  - No ace-I with recent AKI  - seems fairly euvolemic now  AFIB RVR  - stopped Flec  - Dr. Rayann Heman to see today. ?AMIO then TEE CV after adequate load. Await guidance on timing, DC etc.  - improved rate  LBBB   - careful with AMIO, increased risk  MR  - appreciate Dr. Burt Knack team - not felt to benefit from mitraclip  Chronic anticoag  - will increase Eliquis to 5mg  BID (creat has stabilized 1.3)     For questions or updates, please contact  [facility] Please consult [email or web address] for contact info under        Signed, Candee Furbish, MD  09/26/2019, 10:18 AM

## 2019-09-26 NOTE — Progress Notes (Signed)
PROGRESS NOTE        PATIENT DETAILS Name: Travis Kelly Age: 86 y.o. Sex: male Date of Birth: 06-23-1922 Admit Date: 09/22/2019 Admitting Physician Etta Quill, DO XBM:[id number], Nicki Reaper, MD  Brief Narrative: Patient is a 86 y.o. male with history of PSVT, PAF, moderate MR, HLD, chronic diastolic heart failure, chronic neurogenic bladder (in/out catheterization at home at least 3 times daily)-who presented to the hospital with shortness of breath worsening lower extremity edema for approximately 2 days-found to have acute hypoxic respiratory failure secondary to decompensated diastolic heart failure and admitted to the hospitalist service.  See below for further details.   Significant events: 6/13>> admit to [facility] for hypoxia from acute on chronic diastolic heart failure  Significant studies: 09/24/2019>> TTE: EF 10-27%, grade 2 diastolic dysfunction, RV systolic function reduced, moderate MR 09/22/2019>> chest x-ray: Stable cardiomegaly without acute chest process/interval change. 04/17/2018>> TTE: EF 55-60%, moderate MR, moderate pulmonary hypertension  Antimicrobial therapy: Keflex: 6/9>>6/16  Microbiology data: 6/13: Urine culture>> no growth  Procedures : None  Consults: Cardiology  DVT Prophylaxis : apixaban (ELIQUIS) tablet 2.5 mg Start: 09/26/19 1045 apixaban (ELIQUIS) tablet 2.5 mg  apixaban (ELIQUIS) tablet 5 mg    Subjective: No major issues overnight-lying comfortably in bed-appears frail-heart rate still in the 100 range.  Assessment/Plan: Acute hypoxic respiratory failure: Secondary to decompensated diastolic heart failure-apparently was on 4 L of oxygen when he first presented-now much improved and on room air.    Acute on chronic diastolic heart failure: Hypoxia has resolved-volume status markedly better-initially on IV Lasix-transition to oral Lasix.  Continue to follow weights/intake output/electrolytes.  Persistent atrial  fibrillation with RVR: Rate remains uncontrolled this morning-on metoprolol-flecainide discontinued on 6/16 due to low EF.  EP following-awaiting further guidance regarding TEE cardioversion/amiodarone initiation.  Remains on Eliquis.   Moderate mitral regurgitation: Initiate structural heart team consultation-not a candidate for mitral clip procedure.  CKD stage IIIb: Creatinine not far from usual baseline-follow periodically.  History of PSVT: Currently in persistent A. fib-continue telemetry monitoring.  Flecainide discontinued on 6/16 due to low EF.  Recent UTI: Started on Keflex on 6/9 by PCP-7-day course plan-completed course of Keflex.  Urine culture negative as patient was already on antibiotics prior to this hospitalization.  Moderate pulmonary hypertension: Likely secondary to MR/diastolic heart failure/systolic heart failure-further work-up will not change management or outcome.  History of ?  Neurogenic bladder: Per patient-does in and out catheterization at least 3 times daily at home.  He does urinate/dribble as well but apparently is incontinent.  Continue close monitoring with frequent bladder scans-while on diuretic regimen.  Deconditioning/debility: Apparently walks with the help of a walker at baseline-appreciate rehab services evaluation-recommendations are for SNF-but per case management-okay to go back to independent living with home health services.  Diet: Diet Order            Diet Heart Room service appropriate? Yes; Fluid consistency: Thin  Diet effective now                  Code Status:  DNR  Family Communication: Spoke with daughter-[name]-over phone on 6/16-we will update family on 6/18 once we have further input from EP.  Disposition Plan: Status is: Inpatient  Remains inpatient appropriate because:Inpatient level of care appropriate due to severity of illness  Dispo: The patient is from: independent living  Anticipated d/c is to: SNF vs  Independent  living with home health services              Anticipated d/c date is: 2 days              Patient currently is not medically stable to d/c.  Barriers to Discharge: Heart rate uncontrolled this morning with A. fib RVR-awaiting further guidance from EP regarding rate control strategy.  Antimicrobial agents: Anti-infectives (From admission, onward)   Start     Dose/Rate Route Frequency Ordered Stop   09/22/19 2200  cephALEXin (KEFLEX) capsule 500 mg        500 mg Oral 2 times daily 09/22/19 2034 09/25/19 2154   09/22/19 1530  ciprofloxacin (CIPRO) IVPB 200 mg  Status:  Discontinued        200 mg 100 mL/hr over 60 Minutes Intravenous  Once 09/22/19 1520 09/22/19 2005       Time spent: 25 minutes-Greater than 50% of this time was spent in counseling, explanation of diagnosis, planning of further management, and coordination of care.  MEDICATIONS: Scheduled Meds: . apixaban  2.5 mg Oral Once  . apixaban  5 mg Oral BID  . vitamin C  500 mg Oral QPM  . calcium-vitamin D  1 tablet Oral QPM  . cholecalciferol  1,000 Units Oral QPM  . finasteride  5 mg Oral Daily  . furosemide  40 mg Oral Daily  . metoprolol tartrate  25 mg Oral BID  . multivitamin  1 tablet Oral Daily  . senna  1 tablet Oral QPM  . sodium chloride flush  3 mL Intravenous Q12H   Continuous Infusions: . sodium chloride     PRN Meds:.sodium chloride, acetaminophen, fluticasone, metoprolol tartrate, Muscle Rub, ondansetron (ZOFRAN) IV, polyvinyl alcohol, sodium chloride flush   PHYSICAL EXAM: Vital signs: Vitals:   09/25/19 2231 09/26/19 0009 09/26/19 0500 09/26/19 0751  BP:  106/73  118/75  Pulse: (!) 110 100  (!) 110  Resp:  19  17  Temp:  98.1 F (36.7 C)  98.5 F (36.9 C)  TempSrc:  Oral  Oral  SpO2: 99% 92%  94%  Weight:   78.8 kg   Height:       Filed Weights   09/24/19 0345 09/25/19 0346 09/26/19 0500  Weight: 80.3 kg 78.4 kg 78.8 kg   Body mass index is 28.04 kg/m.   Gen  Exam:Alert awake-not in any distress HEENT:atraumatic, normocephalic Chest: B/L clear to auscultation anteriorly CVS:S1S2 irregular Abdomen:soft non tender, non distended Extremities:no edema Neurology: Non focal Skin: no rash  I have personally reviewed following labs and imaging studies  LABORATORY DATA: CBC: Recent Labs  Lab 09/22/19 1233 09/24/19 0230  WBC 16.8* 7.8  NEUTROABS 14.8*  --   HGB 12.8* 11.4*  HCT 40.0 34.7*  MCV 105.5* 102.7*  PLT 171 950    Basic Metabolic Panel: Recent Labs  Lab 09/22/19 1233 09/23/19 0447 09/24/19 0230 09/25/19 0336 09/26/19 0305  NA 136 137 139 141 140  K 4.8 3.7 3.8 4.5 3.6  CL 100 102 102 102 101  CO2 18* 26 27 29 27   GLUCOSE 122* 105* 104* 108* 119*  BUN 39* 40* 38* 32* 28*  CREATININE 1.85* 1.76* 1.55* 1.39* 1.37*  CALCIUM 8.7* 8.4* 8.5* 9.2 8.7*    GFR: Estimated Creatinine Clearance: 31.8 mL/min (A) (by C-G formula based on SCr of 1.37 mg/dL (H)).  Liver Function Tests: No results for input(s): AST, ALT, ALKPHOS, BILITOT,  PROT, ALBUMIN in the last 168 hours. No results for input(s): LIPASE, AMYLASE in the last 168 hours. No results for input(s): AMMONIA in the last 168 hours.  Coagulation Profile: No results for input(s): INR, PROTIME in the last 168 hours.  Cardiac Enzymes: No results for input(s): CKTOTAL, CKMB, CKMBINDEX, TROPONINI in the last 168 hours.  BNP (last 3 results) Recent Labs    10/03/18 1459  PROBNP 4,125*    Lipid Profile: No results for input(s): CHOL, HDL, LDLCALC, TRIG, CHOLHDL, LDLDIRECT in the last 72 hours.  Thyroid Function Tests: No results for input(s): TSH, T4TOTAL, FREET4, T3FREE, THYROIDAB in the last 72 hours.  Anemia Panel: No results for input(s): VITAMINB12, FOLATE, FERRITIN, TIBC, IRON, RETICCTPCT in the last 72 hours.  Urine analysis:    Component Value Date/Time   COLORURINE AMBER (A) 09/22/2019 1408   APPEARANCEUR CLOUDY (A) 09/22/2019 1408   LABSPEC >1.030  (H) 09/22/2019 1408   PHURINE 5.5 09/22/2019 1408   GLUCOSEU NEGATIVE 09/22/2019 1408   HGBUR NEGATIVE 09/22/2019 1408   BILIRUBINUR NEGATIVE 09/22/2019 1408   KETONESUR NEGATIVE 09/22/2019 1408   PROTEINUR 100 (A) 09/22/2019 1408   NITRITE NEGATIVE 09/22/2019 1408   LEUKOCYTESUR NEGATIVE 09/22/2019 1408    Sepsis Labs: Lactic Acid, Venous No results found for: LATICACIDVEN  MICROBIOLOGY: Recent Results (from the past 240 hour(s))  SARS Coronavirus 2 by RT PCR (hospital order, performed in [place] hospital lab) Nasopharyngeal Nasopharyngeal Swab     Status: None   Collection Time: 09/22/19 12:33 PM   Specimen: Nasopharyngeal Swab  Result Value Ref Range Status   SARS Coronavirus 2 NEGATIVE NEGATIVE Final    Comment: (NOTE) SARS-CoV-2 target nucleic acids are NOT DETECTED.  The SARS-CoV-2 RNA is generally detectable in upper and lower respiratory specimens during the acute phase of infection. The lowest concentration of SARS-CoV-2 viral copies this assay can detect is 250 copies / mL. A negative result does not preclude SARS-CoV-2 infection and should not be used as the sole basis for treatment or other patient management decisions.  A negative result may occur with improper specimen collection / handling, submission of specimen other than nasopharyngeal swab, presence of viral mutation(s) within the areas targeted by this assay, and inadequate number of viral copies (<250 copies / mL). A negative result must be combined with clinical observations, patient history, and epidemiological information.  Fact Sheet for Patients:   StrictlyIdeas.no  Fact Sheet for Healthcare Providers: BankingDealers.co.za  This test is not yet approved or  cleared by the [place] FDA and has been authorized for detection and/or diagnosis of SARS-CoV-2 by FDA under an Emergency Use Authorization (EUA).  This EUA will remain in effect  (meaning this test can be used) for the duration of the COVID-19 declaration under Section 564(b)(1) of the Act, 21 U.S.C. section 360bbb-3(b)(1), unless the authorization is terminated or revoked sooner.  Performed at [facility], [address] [place], [place] [address]   Culture, Urine     Status: None   Collection Time: 09/22/19 11:58 PM   Specimen: Urine, Random  Result Value Ref Range Status   Specimen Description URINE, RANDOM  Final   Special Requests NONE  Final   Culture   Final    NO GROWTH Performed at [facility], [place] [address], [place], [place] [address]    Report Status 09/24/2019 FINAL  Final    RADIOLOGY STUDIES/RESULTS: No results found.   LOS: 4 days   [employer]  Sohrab Keelan, MD  [facility]    To contact the attending provider between 7A-7P or the covering provider during after hours 7P-7A, please log into the web site [email or web address] and access using universal [place] password for that web site. If you do not have the password, please call the hospital operator.  09/26/2019, 11:12 AM

## 2019-09-26 NOTE — Care Management Important Message (Signed)
Important Message  Patient Details  Name: Travis Kelly MRN: [id number] Date of Birth: 07-22-1922   Medicare Important Message Given:  Yes     Shelda Altes 09/26/2019, 9:28 AM

## 2019-09-26 NOTE — Progress Notes (Signed)
Physical Therapy Treatment Patient Details Name: Travis Kelly MRN: [id number] DOB: 08-09-1922 Today's Date: 09/26/2019    History of Present Illness 86 yo male with onset of volume overload was admitted with CHF, UTI and has been in a-fib.  Diuresed now and working toward balance of BP and volume.  PMHx:  L BBB, PSVT, CHF, OA knees, CKD3, mitral regurg, macular degeneration, Atherosclerosis, cardiomegaly, neurogenic bladder    PT Comments    Pt reports fatigue from sitting EOB prior to PT arrival, but agreeable to transfer training. Pt required min-mod assist for bed mobility and transfer to standing this day. Pt able to take short lateral steps only this session, limited by weakness and fatigue. PT spoke with both pt and pt's daughter during session about their d/c plan, their preference is for pt to return to ILF with HHPT/HHOT and assist for all mobility which pt's daughter says is possible. PT still feels SNF is safest environment for pt, HHPT as secondary option. PT to continue to follow acutely.    Follow Up Recommendations  SNF     Equipment Recommendations  Rolling walker with 5" wheels    Recommendations for Other Services       Precautions / Restrictions Precautions Precautions: Fall    Mobility  Bed Mobility Overal bed mobility: Needs Assistance Bed Mobility: Supine to Sit;Sit to Supine     Supine to sit: Min assist Sit to supine: Mod assist   General bed mobility comments: min assist for supine>sit for trunk elevation and scooting to EOB. mod assist for return to supine for LE lifting into bed, scooting up in bed with use of bed pad and boost function.  Transfers Overall transfer level: Needs assistance Equipment used: Rolling walker (2 wheeled) Transfers: Sit to/from Stand Sit to Stand: Mod assist;From elevated surface         General transfer comment: Mod assist for power up, hip extension via posterior tactile facilitation, and steadying upon standing.  VC for weight shifting L and R to bring UEs to RW one at a time, hand placement when rising/sitting.  Ambulation/Gait Ambulation/Gait assistance: Min assist Gait Distance (Feet): 2 Feet Assistive device: Rolling walker (2 wheeled) Gait Pattern/deviations: Shuffle;Decreased step length - right;Decreased step length - left Gait velocity: decr   General Gait Details: Lateral stepping toward HOB x3, min assist to steady and physically move RW towards HOB. Pt with intermittent posterior leaning, corrected with tactile cuing. Step-by-step cuing for stepping and weight shifting required.   Stairs             Wheelchair Mobility    Modified Rankin (Stroke Patients Only)       Balance Overall balance assessment: Needs assistance Sitting-balance support: Feet supported Sitting balance-Leahy Scale: Fair   Postural control: Posterior lean Standing balance support: Bilateral upper extremity supported;During functional activity Standing balance-Leahy Scale: Poor Standing balance comment: reliant on external support; posterior leaning requiring min assist to correct                            Cognition Arousal/Alertness: Awake/alert Behavior During Therapy: WFL for tasks assessed/performed Overall Cognitive Status: Within Functional Limits for tasks assessed                                        Exercises      General Comments General comments (  skin integrity, edema, etc.): SpO2 on RA 93% and greater when able to assess during mobility (pulse ox monitor malfunctioning, had to use portable pulse ox).      Pertinent Vitals/Pain Pain Assessment: Faces Faces Pain Scale: Hurts little more Pain Location: RLE, posterior Pain Descriptors / Indicators: Cramping;Aching Pain Intervention(s): Limited activity within patient's tolerance;Monitored during session;Repositioned    Home Living                      Prior Function            PT  Goals (current goals can now be found in the care plan section) Acute Rehab PT Goals Patient Stated Goal: to I living with [facility] PT Goal Formulation: With patient/family Time For Goal Achievement: 10/07/19 Potential to Achieve Goals: Good Progress towards PT goals: Progressing toward goals    Frequency    Min 3X/week      PT Plan Current plan remains appropriate    Co-evaluation              AM-PAC PT "6 Clicks" Mobility   Outcome Measure  Help needed turning from your back to your side while in a flat bed without using bedrails?: A Little Help needed moving from lying on your back to sitting on the side of a flat bed without using bedrails?: A Little Help needed moving to and from a bed to a chair (including a wheelchair)?: A Lot Help needed standing up from a chair using your arms (e.g., wheelchair or bedside chair)?: A Lot Help needed to walk in hospital room?: A Lot Help needed climbing 3-5 steps with a railing? : Total 6 Click Score: 13    End of Session Equipment Utilized During Treatment: Gait belt Activity Tolerance: Patient limited by fatigue;Treatment limited secondary to medical complications (Comment) Patient left: in bed;with family/visitor present;with bed alarm set;with call bell/phone within reach Nurse Communication: Mobility status PT Visit Diagnosis: Unsteadiness on feet (R26.81);Muscle weakness (generalized) (M62.81);Difficulty in walking, not elsewhere classified (R26.2);Dizziness and giddiness (R42)     Time: 8588-5027 PT Time Calculation (min) (ACUTE ONLY): 17 min  Charges:  $Therapeutic Activity: 8-22 mins                     Arneta Mahmood E, PT Acute Rehabilitation Services Pager [phone]  Office [phone]   Florentino Laabs D Elonda Husky 09/26/2019, 12:27 PM

## 2019-09-27 ENCOUNTER — Encounter (HOSPITAL_COMMUNITY): Payer: Self-pay | Admitting: Emergency Medicine

## 2019-09-27 ENCOUNTER — Inpatient Hospital Stay (HOSPITAL_COMMUNITY)
Admission: EM | Admit: 2019-09-27 | Discharge: 2019-10-06 | DRG: 871 | Disposition: A | Discharge status: Skilled Nursing Facility | Payer: No Typology Code available for payment source | Attending: Family Medicine | Admitting: Family Medicine

## 2019-09-27 ENCOUNTER — Other Ambulatory Visit: Payer: Self-pay

## 2019-09-27 DIAGNOSIS — R531 Weakness: Secondary | ICD-10-CM

## 2019-09-27 DIAGNOSIS — L89151 Pressure ulcer of sacral region, stage 1: Secondary | ICD-10-CM | POA: Diagnosis present

## 2019-09-27 DIAGNOSIS — N1832 Chronic kidney disease, stage 3b: Secondary | ICD-10-CM | POA: Diagnosis present

## 2019-09-27 DIAGNOSIS — N390 Urinary tract infection, site not specified: Secondary | ICD-10-CM | POA: Diagnosis present

## 2019-09-27 DIAGNOSIS — R339 Retention of urine, unspecified: Secondary | ICD-10-CM

## 2019-09-27 DIAGNOSIS — N200 Calculus of kidney: Secondary | ICD-10-CM | POA: Diagnosis not present

## 2019-09-27 DIAGNOSIS — Z515 Encounter for palliative care: Secondary | ICD-10-CM

## 2019-09-27 DIAGNOSIS — Z20822 Contact with and (suspected) exposure to covid-19: Secondary | ICD-10-CM | POA: Diagnosis present

## 2019-09-27 DIAGNOSIS — I959 Hypotension, unspecified: Secondary | ICD-10-CM | POA: Diagnosis not present

## 2019-09-27 DIAGNOSIS — I5023 Acute on chronic systolic (congestive) heart failure: Secondary | ICD-10-CM | POA: Diagnosis present

## 2019-09-27 DIAGNOSIS — I5082 Biventricular heart failure: Secondary | ICD-10-CM | POA: Diagnosis present

## 2019-09-27 DIAGNOSIS — J9601 Acute respiratory failure with hypoxia: Secondary | ICD-10-CM | POA: Diagnosis present

## 2019-09-27 DIAGNOSIS — I509 Heart failure, unspecified: Secondary | ICD-10-CM

## 2019-09-27 DIAGNOSIS — Z79899 Other long term (current) drug therapy: Secondary | ICD-10-CM

## 2019-09-27 DIAGNOSIS — R6520 Severe sepsis without septic shock: Secondary | ICD-10-CM | POA: Diagnosis present

## 2019-09-27 DIAGNOSIS — A419 Sepsis, unspecified organism: Secondary | ICD-10-CM | POA: Diagnosis not present

## 2019-09-27 DIAGNOSIS — N179 Acute kidney failure, unspecified: Secondary | ICD-10-CM | POA: Diagnosis present

## 2019-09-27 DIAGNOSIS — I517 Cardiomegaly: Secondary | ICD-10-CM | POA: Diagnosis not present

## 2019-09-27 DIAGNOSIS — E785 Hyperlipidemia, unspecified: Secondary | ICD-10-CM | POA: Diagnosis present

## 2019-09-27 DIAGNOSIS — I4819 Other persistent atrial fibrillation: Secondary | ICD-10-CM

## 2019-09-27 DIAGNOSIS — I4891 Unspecified atrial fibrillation: Secondary | ICD-10-CM

## 2019-09-27 DIAGNOSIS — Z7189 Other specified counseling: Secondary | ICD-10-CM

## 2019-09-27 DIAGNOSIS — E876 Hypokalemia: Secondary | ICD-10-CM | POA: Diagnosis present

## 2019-09-27 DIAGNOSIS — R0902 Hypoxemia: Secondary | ICD-10-CM

## 2019-09-27 DIAGNOSIS — I5042 Chronic combined systolic (congestive) and diastolic (congestive) heart failure: Secondary | ICD-10-CM

## 2019-09-27 DIAGNOSIS — L8990 Pressure ulcer of unspecified site, unspecified stage: Secondary | ICD-10-CM | POA: Insufficient documentation

## 2019-09-27 DIAGNOSIS — Z66 Do not resuscitate: Secondary | ICD-10-CM | POA: Diagnosis present

## 2019-09-27 DIAGNOSIS — M19041 Primary osteoarthritis, right hand: Secondary | ICD-10-CM | POA: Diagnosis present

## 2019-09-27 DIAGNOSIS — N319 Neuromuscular dysfunction of bladder, unspecified: Secondary | ICD-10-CM | POA: Diagnosis present

## 2019-09-27 LAB — BASIC METABOLIC PANEL
Anion gap: 10 (ref 5–15)
BUN: 24 mg/dL — ABNORMAL HIGH (ref 8–23)
CO2: 29 mmol/L (ref 22–32)
Calcium: 8.6 mg/dL — ABNORMAL LOW (ref 8.9–10.3)
Chloride: 97 mmol/L — ABNORMAL LOW (ref 98–111)
Creatinine, Ser: 1.22 mg/dL (ref 0.61–1.24)
GFR calc Af Amer: 58 mL/min — ABNORMAL LOW (ref >60)
GFR calc non Af Amer: 50 mL/min — ABNORMAL LOW (ref >60)
Glucose, Bld: 115 mg/dL — ABNORMAL HIGH (ref 70–99)
Potassium: 3.6 mmol/L (ref 3.5–5.1)
Sodium: 136 mmol/L (ref 135–145)

## 2019-09-27 MED ORDER — METOPROLOL TARTRATE 25 MG PO TABS: 50.0000 mg | ORAL_TABLET | Freq: Two times a day (BID) | ORAL | 0 refills | Status: DC

## 2019-09-27 MED ORDER — APIXABAN 5 MG PO TABS: 5.0000 mg | ORAL_TABLET | Freq: Two times a day (BID) | ORAL | 0 refills | Status: AC

## 2019-09-27 MED ORDER — FUROSEMIDE 20 MG PO TABS: 40.0000 mg | ORAL_TABLET | Freq: Every day | ORAL | 0 refills | Status: AC

## 2019-09-27 MED ORDER — POTASSIUM CHLORIDE CRYS ER 20 MEQ PO TBCR
40.0000 meq | EXTENDED_RELEASE_TABLET | Freq: Once | ORAL | Status: AC
  Administered 2019-09-27: 40 meq via ORAL
  Filled 2019-09-27: qty 2

## 2019-09-27 MED ORDER — AMIODARONE HCL 200 MG PO TABS: 200.0000 mg | ORAL_TABLET | Freq: Every day | ORAL | 0 refills | Status: DC

## 2019-09-27 MED FILL — ELIQUIS 5 MG TABLET: 5 | 30 days supply | Qty: 60 | Fill #0

## 2019-09-27 MED FILL — METOPROLOL TARTRATE 25 MG T: 25 | 30 days supply | Qty: 120 | Fill #0

## 2019-09-27 MED FILL — FUROSEMIDE 20 MG TAB: 20 | 30 days supply | Qty: 60 | Fill #0

## 2019-09-27 MED FILL — AMIODARONE HCL 200 MG TAB: 200 | 30 days supply | Qty: 30 | Fill #0

## 2019-09-27 NOTE — Progress Notes (Signed)
Progress Note  Patient Name: Travis Kelly Date of Encounter: 09/27/2019  [facility] Cardiologist: Quay Burow, MD   Subjective   Resting comfortably in bed.  No chest pain no significant shortness of breath.  Walked around yesterday.  Lives at [facility] Medications    Scheduled Meds: . amiodarone  200 mg Oral Daily  . apixaban  5 mg Oral BID  . vitamin C  500 mg Oral QPM  . calcium-vitamin D  1 tablet Oral QPM  . cholecalciferol  1,000 Units Oral QPM  . finasteride  5 mg Oral Daily  . furosemide  40 mg Oral Daily  . metoprolol tartrate  50 mg Oral BID  . multivitamin  1 tablet Oral Daily  . senna  1 tablet Oral QPM  . sodium chloride flush  3 mL Intravenous Q12H   Continuous Infusions: . sodium chloride     PRN Meds: sodium chloride, acetaminophen, fluticasone, metoprolol tartrate, Muscle Rub, ondansetron (ZOFRAN) IV, polyvinyl alcohol, sodium chloride flush   Vital Signs    Vitals:   09/26/19 2034 09/27/19 0001 09/27/19 0338 09/27/19 0750  BP:  (!) 120/92 128/78 100/76  Pulse:  100 (!) 102 (!) 108  Resp:  18 18 17   Temp:  98 F (36.7 C) 98.7 F (37.1 C) 98.6 F (37 C)  TempSrc:  Oral Oral Oral  SpO2: 94% 98% 95% 93%  Weight:      Height:        Intake/Output Summary (Last 24 hours) at 09/27/2019 0903 Last data filed at 09/27/2019 0338 Gross per 24 hour  Intake 120 ml  Output 1450 ml  Net -1330 ml   Last 3 Weights 09/26/2019 09/25/2019 09/24/2019  Weight (lbs) 173 lb 11.6 oz 172 lb 13.5 oz 177 lb 0.5 oz  Weight (kg) 78.8 kg 78.4 kg 80.3 kg      Telemetry    AFIB 90-120 - Personally Reviewed  ECG    No new - Personally Reviewed  Physical Exam   GEN: No acute distress.  Elderly Neck: No JVD Cardiac:  Irregularly irregular, mildly tachycardic, 2/6 holosystolic murmur, no rubs, or gallops.  Respiratory: Clear to auscultation bilaterally. GI: Soft, nontender, non-distended  MS: No edema; No deformity. Neuro:  Nonfocal    Psych: Normal affect   Labs    High Sensitivity Troponin:   Recent Labs  Lab 09/22/19 1223 09/22/19 1416  TROPONINIHS 46* 49*      Chemistry Recent Labs  Lab 09/25/19 0336 09/26/19 0305 09/27/19 0312  NA 141 140 136  K 4.5 3.6 3.6  CL 102 101 97*  CO2 29 27 29   GLUCOSE 108* 119* 115*  BUN 32* 28* 24*  CREATININE 1.39* 1.37* 1.22  CALCIUM 9.2 8.7* 8.6*  GFRNONAA 43* 44* 50*  GFRAA 50* 50* 58*  ANIONGAP 10 12 10      Hematology Recent Labs  Lab 09/22/19 1233 09/24/19 0230  WBC 16.8* 7.8  RBC 3.79* 3.38*  HGB 12.8* 11.4*  HCT 40.0 34.7*  MCV 105.5* 102.7*  MCH 33.8 33.7  MCHC 32.0 32.9  RDW 14.6 14.1  PLT 171 200    BNP Recent Labs  Lab 09/22/19 1223  BNP 1,552.3*     DDimer No results for input(s): DDIMER in the last 168 hours.   Radiology    No results found.  Cardiac Studies   EF 35 to 40%, moderate eccentric mitral regurgitation  Patient Profile     86 y.o. male with issues  below:  Assessment & Plan    New onset atrial fibrillation with rapid ventricular response -Appreciate Dr. Rayann Heman and EP team assistance -Started amiodarone 200 mg twice daily for gentle load.  Understands potential risk for further conduction deterioration.  Continue to monitor liver function TSH pulmonary status. -We will consider cardioversion in 3 weeks with no TEE necessary. -Has atrial fibrillation clinic follow-up on Monday with Roderic Palau, NP. -Yesterday increase metoprolol to 50 mg twice daily.  Chronic anticoagulation -Since his creatinine has improved to less than 1.5, we have a dose adjusted his Eliquis to 5 mg twice a day.  Watch for any signs of bleeding.  Mitral regurgitation-middle scallop posterior mitral leaflet dysfunction -At least moderate, severely dilated left atrium.  Appreciate Dr. Burt Knack in structural heart team consultation earlier in this admission.  We will continue with medical management.  Cardiomyopathy, acute systolic heart  failure-EF 35 to 40% -Possibly tachycardia mediated.  Mitral regurgitation may be playing a role as well. -Continue with atrial fibrillation management and Lasix 40 mg once a day. -No ACE inhibitor because of recent acute kidney injury.  Secondary pulmonary hypertension -Secondary from left heart failure.  Discussed with Dr. Rayann Heman this morning.  Also discussed with his daughters.  Called [name] and [place] on the phone.  Okay with discharge.  Close follow-up Monday.       For questions or updates, please contact [place] Please consult [email or web address] for contact info under        Signed, Candee Furbish, MD  09/27/2019, 9:03 AM

## 2019-09-27 NOTE — Progress Notes (Signed)
Bladder scan showed 184 ml. I/O cath produced 225 ml. Two RN's performed. Patient tolerated procedure well. Pt resting with call bell within reach.  Will continue to monitor.

## 2019-09-27 NOTE — Discharge Summary (Signed)
PATIENT DETAILS Name: Travis Kelly Age: 86 y.o. Sex: male Date of Birth: 02/24/1923 MRN: [id number]. Admitting Physician: Etta Quill, DO WNI:[id number], Nicki Reaper, MD  Admit Date: 09/22/2019 Discharge date: 09/27/2019  Recommendations for Outpatient Follow-up:  1. Follow up with PCP in 1-2 weeks 2. Please obtain CMP/CBC in one week 3. Please ensure follow-up with cardiology.  Admitted From:  Independent living  Disposition: Independent living with maximum home health services and 24/7 supervision by family (patient/family not keen on SNF)   Home Health: Yes  Equipment/Devices: None  Discharge Condition: Stable  CODE STATUS: DNR  Diet recommendation:  Diet Order            Diet - low sodium heart healthy           Diet Heart Room service appropriate? Yes; Fluid consistency: Thin  Diet effective now                  Brief Narrative: Patient is a 86 y.o. male with history of PSVT, PAF, moderate MR, HLD, chronic diastolic heart failure, chronic neurogenic bladder (in/out catheterization at home at least 3 times daily)-who presented to the hospital with shortness of breath worsening lower extremity edema for approximately 2 days-found to have acute hypoxic respiratory failure secondary to decompensated diastolic heart failure and admitted to the hospitalist service.  See below for further details.   Significant events: 6/13>> admit to [facility] for hypoxia from acute on chronic diastolic heart failure  Significant studies: 09/24/2019>> TTE: EF 93-81%, grade 2 diastolic dysfunction, RV systolic function reduced, moderate MR 09/22/2019>> chest x-ray: Stable cardiomegaly without acute chest process/interval change. 04/17/2018>> TTE: EF 55-60%, moderate MR, moderate pulmonary hypertension  Antimicrobial therapy: Keflex: 6/9>>6/16  Microbiology data: 6/13: Urine culture>> no growth  Procedures : None  Consults: Cardiology  Brief Hospital Course: Acute hypoxic  respiratory failure: Secondary to decompensated diastolic heart failure-apparently was on 4 L of oxygen when he first presented-now much improved and on room air.    Acute on chronic diastolic heart failure: Hypoxia has resolved-volume status markedly better-initially on IV Lasix-transition to oral Lasix.  Continue to follow weights/intake output/electrolytes.  Persistent atrial fibrillation with RVR: Rate slightly better this morning-seen by cardiology and electrophysiology-flecainide discontinued due to low ejection fraction.  Beta-blocker uptitrated slightly-amiodarone added.  Also started on Eliquis.  Plans are DC cardioversion in 2-3 weeks if he remains in A. fib-patient will follow with the A. fib clinic this coming Monday.  Per cardiology and EP-okay to discharge today.  Moderate mitral regurgitation:  Appreciate structural heart team consultation-not a candidate for mitral clip procedure.  CKD stage IIIb: Creatinine not far from usual baseline-follow periodically.  History of PSVT: Currently in persistent A. fib-continue telemetry monitoring.  Flecainide discontinued on 6/16 due to low EF.  Recent UTI: Started on Keflex on 6/9 by PCP-7-day course plan-completed course of Keflex.  Urine culture negative as patient was already on antibiotics prior to this hospitalization.  Moderate pulmonary hypertension: Likely secondary to MR/diastolic heart failure/systolic heart failure-further work-up will not change management or outcome.  History of ?  Neurogenic bladder: Per patient-does in and out catheterization at least 3 times daily at home.  He does urinate/dribble as well but apparently is incontinent.  Closely monitoring-managed with frequent bladder scans and in/out catheterization.  Deconditioning/debility: Apparently walks with the help of a walker at baseline-appreciate rehab services evaluation-recommendations are for SNF-but per case management-okay to go back to independent  living with home health services.  Discharge  Diagnoses:  Principal Problem:   Acute on chronic diastolic CHF (congestive heart failure) (HCC) Active Problems:   History of PSVT    Moderate mitral regurgitation   Chronic renal insufficiency, stage 3 (moderate)   PAF (paroxysmal atrial fibrillation) ([place])   Discharge Instructions:  Activity:  As tolerated with Full fall precautions use walker/cane & assistance as needed  Discharge Instructions    (HEART FAILURE PATIENTS) Call MD:  Anytime you have any of the following symptoms: 1) 3 pound weight gain in 24 hours or 5 pounds in 1 week 2) shortness of breath, with or without a dry hacking cough 3) swelling in the hands, feet or stomach 4) if you have to sleep on extra pillows at night in order to breathe.   Complete by: As directed    Diet - low sodium heart healthy   Complete by: As directed    Discharge instructions   Complete by: As directed    Follow with Primary MD  [name], MD in 1-2 weeks  Please get a complete blood count and chemistry panel checked by your Primary MD at your next visit, and again as instructed by your Primary MD.  Get Medicines reviewed and adjusted: Please take all your medications with you for your next visit with your Primary MD  Laboratory/radiological data: Please request your Primary MD to go over all hospital tests and procedure/radiological results at the follow up, please ask your Primary MD to get all Hospital records sent to his/her office.  In some cases, they will be blood work, cultures and biopsy results pending at the time of your discharge. Please request that your primary care M.D. follows up on these results.  Also Note the following: If you experience worsening of your admission symptoms, develop shortness of breath, life threatening emergency, suicidal or homicidal thoughts you must seek medical attention immediately by calling 911 or calling your MD immediately  if symptoms  less severe.  You must read complete instructions/literature along with all the possible adverse reactions/side effects for all the Medicines you take and that have been prescribed to you. Take any new Medicines after you have completely understood and accpet all the possible adverse reactions/side effects.   Do not drive when taking Pain medications or sleeping medications (Benzodaizepines)  Do not take more than prescribed Pain, Sleep and Anxiety Medications. It is not advisable to combine anxiety,sleep and pain medications without talking with your primary care practitioner  Special Instructions: If you have smoked or chewed Tobacco  in the last 2 yrs please stop smoking, stop any regular Alcohol  and or any Recreational drug use.  Wear Seat belts while driving.  Please note: You were cared for by a hospitalist during your hospital stay. Once you are discharged, your primary care physician will handle any further medical issues. Please note that NO REFILLS for any discharge medications will be authorized once you are discharged, as it is imperative that you return to your primary care physician (or establish a relationship with a primary care physician if you do not have one) for your post hospital discharge needs so that they can reassess your need for medications and monitor your lab values.   Increase activity slowly   Complete by: As directed      Allergies as of 09/27/2019      Reactions   Rocephin [ceftriaxone Sodium In Dextrose] Nausea And Vomiting, Other (See Comments)    Hallucinations Tolerated Keflex course 09/2019   Barbital Swelling, Rash,  Other (See Comments)   Blue lips and swollen tongue      Medication List    STOP taking these medications   aspirin EC 81 MG tablet   cephALEXin 500 MG capsule Commonly known as: KEFLEX   flecainide 50 MG tablet Commonly known as: TAMBOCOR   naproxen sodium 220 MG tablet Commonly known as: ALEVE     TAKE these medications     acetaminophen 500 MG tablet Commonly known as: TYLENOL Take 1,000 mg by mouth daily as needed for headache (pain).   amiodarone 200 MG tablet Commonly known as: PACERONE Take 1 tablet (200 mg total) by mouth daily. Start taking on: September 28, 2019   apixaban 5 MG Tabs tablet Commonly known as: ELIQUIS Take 1 tablet (5 mg total) by mouth 2 (two) times daily.   Artificial Tears 1.4 % ophthalmic solution Generic drug: polyvinyl alcohol Place 1 drop into both eyes daily as needed for dry eyes.   CALCIUM 500 +D PO Take 500 mg by mouth every evening.   cholecalciferol 25 MCG (1000 UNIT) tablet Commonly known as: VITAMIN D3 Take 1,000 Units by mouth every evening.   finasteride 5 MG tablet Commonly known as: PROSCAR Take 5 mg by mouth daily.   fluticasone 50 MCG/ACT nasal spray Commonly known as: FLONASE Place 1-2 sprays into both nostrils daily as needed for allergies or rhinitis.   furosemide 20 MG tablet Commonly known as: LASIX Take 2 tablets (40 mg total) by mouth daily. What changed: how much to take   Krill Oil 500 MG Caps Take 500 mg by mouth every evening.   Lutein 20 MG Caps Take 20 mg by mouth every evening.   metoprolol tartrate 25 MG tablet Commonly known as: LOPRESSOR Take 2 tablets (50 mg total) by mouth 2 (two) times daily. What changed:   how much to take  when to take this   [place] Take 1 capsule by mouth every evening.   senna 8.6 MG Tabs tablet Commonly known as: SENOKOT Take 1 tablet by mouth every evening.   SUPER B COMPLEX PO Take 1 tablet by mouth every evening.   vitamin C 500 MG tablet Commonly known as: ASCORBIC ACID Take 500 mg by mouth every evening.       [place]. Follow up.   Specialty: Physical Therapy Why: Rollene Fare at [facility] to f/u on PT/OT with [facility].  Contact information: [address] [place]  [address] [phone]              Allergies  Allergen Reactions  . Rocephin [Ceftriaxone Sodium In Dextrose] Nausea And Vomiting and Other (See Comments)     Hallucinations  Tolerated Keflex course 09/2019  . Barbital Swelling, Rash and Other (See Comments)    Blue lips and swollen tongue     Other Procedures/Studies: DG Chest Portable 1 View  Result Date: 09/22/2019 CLINICAL DATA:  Shortness of breath, decreased urine output EXAM: PORTABLE CHEST 1 VIEW COMPARISON:  04/16/2018 FINDINGS: Stable cardiomegaly without CHF or pneumonia. Negative for edema, effusion or pneumothorax. Trachea midline. Aorta atherosclerotic. IMPRESSION: Stable cardiomegaly without acute chest process or interval change. Aortic Atherosclerosis (ICD10-I70.0). Electronically Signed   By: Jerilynn Mages.  Shick M.D.   On: 09/22/2019 13:05   ECHOCARDIOGRAM COMPLETE  Result Date: 09/24/2019    ECHOCARDIOGRAM REPORT   Patient Name:   SAIVION GOETTEL Date of Exam: 09/24/2019 Medical Rec #:  [id number]      Height:       66.0 in Accession #:    [id number]     Weight:       177.0 lb Date of Birth:  [birth year]       BSA:          1.899 m Patient Age:    89 years       BP:           113/80 mmHg Patient Gender: M              HR:           97 bpm. Exam Location:  Inpatient Procedure: 2D Echo, Cardiac Doppler and Color Doppler Indications:    CHF-Acute Diastolic 620.35 / D97.41  History:        Patient has prior history of Echocardiogram examinations, most                 recent 04/17/2018. CHF, Arrythmias:Atrial Fibrillation and LBBB;                 Risk Factors:Dyslipidemia and Former Smoker. PSVT. MR. DOE.                 Elevated troponin.  Sonographer:    Vickie Epley RDCS Referring Phys: [place]  1. Left ventricular ejection fraction, by estimation, is 35 to 40%. The left ventricle has moderately decreased function. The left ventricle demonstrates global hypokinesis. Left ventricular diastolic parameters are consistent  with Grade II diastolic dysfunction (pseudonormalization).  2. Right ventricular systolic function is moderately reduced. The right ventricular size is moderately enlarged. There is severely elevated pulmonary artery systolic pressure. The estimated right ventricular systolic pressure is 63.8 mmHg.  3. Left atrial size was severely dilated.  4. Right atrial size was severely dilated.  5. Suspect ruptured chorda(e) tendina(e) to the middle scallop of the posterior mitral leaflet. The severity of the mitral insufficiency jet may be underestimated due to its highly eccentric nature. The mitral valve is myxomatous. Moderate mitral valve regurgitation.  6. Tricuspid valve regurgitation is mild to moderate.  7. The aortic valve is tricuspid. Aortic valve regurgitation is mild to moderate.  8. The inferior vena cava is dilated in size with <50% respiratory variability, suggesting right atrial pressure of 15 mmHg. Comparison(s): Prior images reviewed side by side. The left ventricular function is significantly worse. FINDINGS  Left Ventricle: Left ventricular ejection fraction, by estimation, is 35 to 40%. The left ventricle has moderately decreased function. The left ventricle demonstrates global hypokinesis. The left ventricular internal cavity size was normal in size. There is no left ventricular hypertrophy. Left ventricular diastolic parameters are consistent with Grade II diastolic dysfunction (pseudonormalization). Normal left ventricular filling pressure. Right Ventricle: The right ventricular size is moderately enlarged. No increase in right ventricular wall thickness. Right ventricular systolic function is moderately reduced. There is severely elevated pulmonary artery systolic pressure. The tricuspid regurgitant velocity is 3.43 m/s, and with an assumed right atrial pressure of 15 mmHg, the estimated right ventricular systolic pressure is 45.3 mmHg. Left Atrium: Left atrial size was severely dilated. Right  Atrium: Right atrial size was severely dilated. Pericardium: There is no evidence of pericardial effusion. Mitral Valve: Suspect ruptured chorda(e) tendina(e) to the middle scallop of the posterior mitral leaflet. The severity of the mitral insufficiency jet may be underestimated due to its highly eccentric nature. The mitral valve is myxomatous. Mild to moderate mitral annular calcification. Moderate  mitral valve regurgitation, with anteriorly-directed jet. Tricuspid Valve: The tricuspid valve is normal in structure. Tricuspid valve regurgitation is mild to moderate. Aortic Valve: The aortic valve is tricuspid. . There is moderate thickening and mild calcification of the aortic valve. Aortic valve regurgitation is mild to moderate. Aortic regurgitation PHT measures 417 msec. There is moderate thickening of the aortic  valve. There is mild calcification of the aortic valve. Pulmonic Valve: The pulmonic valve was normal in structure. Pulmonic valve regurgitation is mild. Aorta: The aortic root is normal in size and structure. Venous: The inferior vena cava is dilated in size with less than 50% respiratory variability, suggesting right atrial pressure of 15 mmHg. IAS/Shunts: No atrial level shunt detected by color flow Doppler.  LEFT VENTRICLE PLAX 2D LVIDd:         5.00 cm LVIDs:         4.50 cm LV PW:         1.10 cm LV IVS:        1.10 cm LVOT diam:     1.90 cm LV SV:         25 LV SV Index:   13 LVOT Area:     2.84 cm  LV Volumes (MOD) LV vol d, MOD A2C: 111.0 ml LV vol d, MOD A4C: 112.0 ml LV vol s, MOD A2C: 86.7 ml LV vol s, MOD A4C: 64.6 ml LV SV MOD A2C:     24.3 ml LV SV MOD A4C:     112.0 ml LV SV MOD BP:      38.1 ml RIGHT VENTRICLE TAPSE (M-mode): 0.8 cm LEFT ATRIUM             Index       RIGHT ATRIUM           Index LA diam:        5.90 cm 3.11 cm/m  RA Area:     20.30 cm LA Vol (A2C):   78.5 ml 41.34 ml/m RA Volume:   58.40 ml  30.76 ml/m LA Vol (A4C):   71.4 ml 37.60 ml/m LA Biplane Vol: 77.3 ml  40.71 ml/m  AORTIC VALVE LVOT Vmax:   56.40 cm/s LVOT Vmean:  40.600 cm/s LVOT VTI:    0.088 m AI PHT:      417 msec  AORTA Ao Root diam: 3.40 cm TRICUSPID VALVE TR Peak grad:   47.1 mmHg TR Vmax:        343.00 cm/s  SHUNTS Systemic VTI:  0.09 m Systemic Diam: 1.90 cm Dani Gobble Croitoru MD Electronically signed by Sanda Klein MD Signature Date/Time: 09/24/2019/1:12:43 PM    Final      TODAY-DAY OF DISCHARGE:  Subjective:   Jaquelyn Bitter [place] today has no headache,no chest abdominal pain,no new weakness tingling or numbness, feels much better wants to go home today.   Objective:   Blood pressure 107/78, pulse (!) 108, temperature 98.1 F (36.7 C), temperature source Oral, resp. rate 15, height 5\' 6"  (1.676 m), weight 78.8 kg, SpO2 92 %.  Intake/Output Summary (Last 24 hours) at 09/27/2019 1225 Last data filed at 09/27/2019 1035 Gross per 24 hour  Intake 240 ml  Output 1175 ml  Net -935 ml   Filed Weights   09/24/19 0345 09/25/19 0346 09/26/19 0500  Weight: 80.3 kg 78.4 kg 78.8 kg    Exam: Awake Alert, Oriented *3, No new F.N deficits, Normal affect .AT,PERRAL Supple Neck,No JVD, No cervical lymphadenopathy appriciated.  Symmetrical Chest wall movement,  Good air movement bilaterally, CTAB RRR,No Gallops,Rubs or new Murmurs, No Parasternal Heave +ve B.Sounds, Abd Soft, Non tender, No organomegaly appriciated, No rebound -guarding or rigidity. No Cyanosis, Clubbing or edema, No new Rash or bruise   PERTINENT RADIOLOGIC STUDIES: No results found.   PERTINENT LAB RESULTS: CBC: No results for input(s): WBC, HGB, HCT, PLT in the last 72 hours. CMET CMP     Component Value Date/Time   NA 136 09/27/2019 0312   NA 139 10/30/2018 1105   K 3.6 09/27/2019 0312   CL 97 (L) 09/27/2019 0312   CO2 29 09/27/2019 0312   GLUCOSE 115 (H) 09/27/2019 0312   BUN 24 (H) 09/27/2019 0312   BUN 32 10/30/2018 1105   CREATININE 1.22 09/27/2019 0312   CALCIUM 8.6 (L) 09/27/2019 0312   PROT 6.9  04/16/2018 1824   ALBUMIN 3.6 04/16/2018 1824   AST 78 (H) 04/16/2018 1824   ALT 72 (H) 04/16/2018 1824   ALKPHOS 73 04/16/2018 1824   BILITOT 0.8 04/16/2018 1824   GFRNONAA 50 (L) 09/27/2019 0312   GFRAA 58 (L) 09/27/2019 0312    GFR Estimated Creatinine Clearance: 35.8 mL/min (by C-G formula based on SCr of 1.22 mg/dL). No results for input(s): LIPASE, AMYLASE in the last 72 hours. No results for input(s): CKTOTAL, CKMB, CKMBINDEX, TROPONINI in the last 72 hours. Invalid input(s): POCBNP No results for input(s): DDIMER in the last 72 hours. No results for input(s): HGBA1C in the last 72 hours. No results for input(s): CHOL, HDL, LDLCALC, TRIG, CHOLHDL, LDLDIRECT in the last 72 hours. No results for input(s): TSH, T4TOTAL, T3FREE, THYROIDAB in the last 72 hours.  Invalid input(s): FREET3 No results for input(s): VITAMINB12, FOLATE, FERRITIN, TIBC, IRON, RETICCTPCT in the last 72 hours. Coags: No results for input(s): INR in the last 72 hours.  Invalid input(s): PT Microbiology: Recent Results (from the past 240 hour(s))  SARS Coronavirus 2 by RT PCR (hospital order, performed in [facility] hospital lab) Nasopharyngeal Nasopharyngeal Swab     Status: None   Collection Time: 09/22/19 12:33 PM   Specimen: Nasopharyngeal Swab  Result Value Ref Range Status   SARS Coronavirus 2 NEGATIVE NEGATIVE Final    Comment: (NOTE) SARS-CoV-2 target nucleic acids are NOT DETECTED.  The SARS-CoV-2 RNA is generally detectable in upper and lower respiratory specimens during the acute phase of infection. The lowest concentration of SARS-CoV-2 viral copies this assay can detect is 250 copies / mL. A negative result does not preclude SARS-CoV-2 infection and should not be used as the sole basis for treatment or other patient management decisions.  A negative result may occur with improper specimen collection / handling, submission of specimen other than nasopharyngeal swab, presence of viral  mutation(s) within the areas targeted by this assay, and inadequate number of viral copies (<250 copies / mL). A negative result must be combined with clinical observations, patient history, and epidemiological information.  Fact Sheet for Patients:   StrictlyIdeas.no  Fact Sheet for Healthcare Providers: BankingDealers.co.za  This test is not yet approved or  cleared by the [place] FDA and has been authorized for detection and/or diagnosis of SARS-CoV-2 by FDA under an Emergency Use Authorization (EUA).  This EUA will remain in effect (meaning this test can be used) for the duration of the COVID-19 declaration under Section 564(b)(1) of the Act, 21 U.S.C. section 360bbb-3(b)(1), unless the authorization is terminated or revoked sooner.  Performed at [facility], [address] [place],  [place] [address]   Culture, Urine     Status: None   Collection Time: 09/22/19 11:58 PM   Specimen: Urine, Random  Result Value Ref Range Status   Specimen Description URINE, RANDOM  Final   Special Requests NONE  Final   Culture   Final    NO GROWTH Performed at [facility], [address] [place], [place] [address]    Report Status 09/24/2019 FINAL  Final    FURTHER DISCHARGE INSTRUCTIONS:  Get Medicines reviewed and adjusted: Please take all your medications with you for your next visit with your Primary MD  Laboratory/radiological data: Please request your Primary MD to go over all hospital tests and procedure/radiological results at the follow up, please ask your Primary MD to get all Hospital records sent to his/her office.  In some cases, they will be blood work, cultures and biopsy results pending at the time of your discharge. Please request that your primary care M.D. goes through all the records of your hospital data and follows up on these results.  Also Note the following: If you experience worsening of  your admission symptoms, develop shortness of breath, life threatening emergency, suicidal or homicidal thoughts you must seek medical attention immediately by calling 911 or calling your MD immediately  if symptoms less severe.  You must read complete instructions/literature along with all the possible adverse reactions/side effects for all the Medicines you take and that have been prescribed to you. Take any new Medicines after you have completely understood and accpet all the possible adverse reactions/side effects.   Do not drive when taking Pain medications or sleeping medications (Benzodaizepines)  Do not take more than prescribed Pain, Sleep and Anxiety Medications. It is not advisable to combine anxiety,sleep and pain medications without talking with your primary care practitioner  Special Instructions: If you have smoked or chewed Tobacco  in the last 2 yrs please stop smoking, stop any regular Alcohol  and or any Recreational drug use.  Wear Seat belts while driving.  Please note: You were cared for by a hospitalist during your hospital stay. Once you are discharged, your primary care physician will handle any further medical issues. Please note that NO REFILLS for any discharge medications will be authorized once you are discharged, as it is imperative that you return to your primary care physician (or establish a relationship with a primary care physician if you do not have one) for your post hospital discharge needs so that they can reassess your need for medications and monitor your lab values.  Total Time spent coordinating discharge including counseling, education and face to face time equals 35 minutes.  SignedOren Binet 09/27/2019 12:25 PM

## 2019-09-27 NOTE — ED Triage Notes (Signed)
Patient presents from [facility] due to inability to self cath at home. Several people tried but could not advance it. Patient was discharged from [facility] recently after treatment for a UTI. He is A&O x 3 and has trouble ambulating.      EMS vitals: 110/64 BP 72 HR 16 Resp Rate 96% O2 sat on room air 97 Temp

## 2019-09-27 NOTE — Progress Notes (Signed)
Patient dressed with his clothing. Bladder scan prior to discharge shows 141 ml. No I/O cath at this time. Patient discharged to independent living facility [facility]. Discharge paperwork given to daughters and PTAR. Mar[place] KitchenPatient transported via [place].

## 2019-09-27 NOTE — TOC Transition Note (Addendum)
Transition of Care [facility]) - CM/SW Discharge Note Marvetta Gibbons RN, BSN Transitions of Care Unit 4E- RN Case Manager [phone]   Patient Details  Name: Travis Kelly MRN: [id number] Date of Birth: [birth year]  Transition of Care [facility] - Cl[facility]) CM/SW Contact:  Dawayne Patricia, RN Phone Number: 09/27/2019, 12:24 PM   Clinical Narrative:    Per MD pt stable for transition back to [place] apartment today. Spoke with pt and daughter Travis Kelly at the bedside this am for transition of care plan. Per discussion plan remains for pt to return to [place] at [facility], pt and family do not want SNF level of care and have arranged for care at [place] between family and additional help through [place]. Pt has all needed DME at home including hospital bed, Rollator, electric scooter, transport chair, lift chair.  Plan will be for pt to receive therapy with [facility] inhouse [facility] at [facility]- will f/u with Rollene Fare at [place] and make sure orders have been sent.  Also discussed medications- new eliquis and copay cost per month $45. Daughter would prefer to get any new meds here through [place] prior to discharge- msg sent to attending MD regarding sending scripts to [facility] for discharge.   Call made to [facility] with [facility] [phone])- she confirmed that [place] is agreeable to [place] returning [place] and has worked with family for increased support. Pt will have assistance through [facility] at home along with family and Rollene Fare states they have a copy of the schedule that has been provided by the family. Therapy orders have been faxed to [facility] [place] via epic to [phone]  1300- spoke again with daugther Travis Kelly, and also daughter Travis Kelly regarding transition plan, discussed again private duty assist and pt also has some assistance that comes from [place]- daughter Travis Kelly to call [facility] to see about adding additional hours from the [place], explained that Medicare does not cover there personal care. Discussing  transport home.   1330- have decided to have pt transport home via non-emergent EMS- will call [facility] and schedule transport- GOLD DNR and paperwork placed on shadow chart- TOC to deliver meds within the next 30-45 min.    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

## 2019-09-27 NOTE — Progress Notes (Addendum)
Electrophysiology Rounding Note  Patient Name: Travis Kelly Date of Encounter: 09/27/2019  Primary Cardiologist: Quay Burow, MD Electrophysiologist: Thompson Grayer, MD   Subjective   The patient is doing well this am. He is tired, but overall doing well.   Inpatient Medications    Scheduled Meds: . amiodarone  200 mg Oral Daily  . apixaban  5 mg Oral BID  . vitamin C  500 mg Oral QPM  . calcium-vitamin D  1 tablet Oral QPM  . cholecalciferol  1,000 Units Oral QPM  . finasteride  5 mg Oral Daily  . furosemide  40 mg Oral Daily  . metoprolol tartrate  50 mg Oral BID  . multivitamin  1 tablet Oral Daily  . senna  1 tablet Oral QPM  . sodium chloride flush  3 mL Intravenous Q12H   Continuous Infusions: . sodium chloride     PRN Meds: sodium chloride, acetaminophen, fluticasone, metoprolol tartrate, Muscle Rub, ondansetron (ZOFRAN) IV, polyvinyl alcohol, sodium chloride flush   Vital Signs    Vitals:   09/26/19 2034 09/27/19 0001 09/27/19 0338 09/27/19 0750  BP:  (!) 120/92 128/78 100/76  Pulse:  100 (!) 102 (!) 108  Resp:  18 18 17   Temp:  98 F (36.7 C) 98.7 F (37.1 C) 98.6 F (37 C)  TempSrc:  Oral Oral Oral  SpO2: 94% 98% 95% 93%  Weight:      Height:        Intake/Output Summary (Last 24 hours) at 09/27/2019 0901 Last data filed at 09/27/2019 0338 Gross per 24 hour  Intake 120 ml  Output 1450 ml  Net -1330 ml   Filed Weights   09/24/19 0345 09/25/19 0346 09/26/19 0500  Weight: 80.3 kg 78.4 kg 78.8 kg    Physical Exam    General: elderly and chronically ill appearing. No resp difficulty. HEENT: Normal Neck: Supple. JVP 6-7. Carotids 2+ bilat; no bruits. No thyromegaly or nodule noted. Cor: PMI nondisplaced. Irregularly irregular. No M/G/R noted Lungs: CTAB, normal effort. Abdomen: Soft, non-tender, non-distended, no HSM. No bruits or masses. +BS  Extremities: No cyanosis, clubbing, or rash. R and LLE no edema.  Neuro: Alert & orientedx3,  cranial nerves grossly intact. moves all 4 extremities w/o difficulty. Affect pleasant  Labs    CBC No results for input(s): WBC, NEUTROABS, HGB, HCT, MCV, PLT in the last 72 hours. Basic Metabolic Panel Recent Labs    09/26/19 0305 09/27/19 0312  NA 140 136  K 3.6 3.6  CL 101 97*  CO2 27 29  GLUCOSE 119* 115*  BUN 28* 24*  CREATININE 1.37* 1.22  CALCIUM 8.7* 8.6*   Liver Function Tests No results for input(s): AST, ALT, ALKPHOS, BILITOT, PROT, ALBUMIN in the last 72 hours. No results for input(s): LIPASE, AMYLASE in the last 72 hours. Cardiac Enzymes No results for input(s): CKTOTAL, CKMB, CKMBINDEX, TROPONINI in the last 72 hours.   Telemetry    AF with rates continued in the 90-120s (personally reviewed)  Radiology    No results found.  Patient Profile     Travis Kelly is a 86 y.o. 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  Skains.  Assessment & Plan    1. H/o SVT Had previously been controlled on flecainide. Now stopped with new cardiomyopathy. No change. 2. New Afib He has been started on Eliquis for CHA2DS2Vasc is 3 this admission Now on amiodarone 200 mg BID and metoprolol increased Will plan on cardioversion after 3 weeks on amio and eliquis.  3. Baseline LBBB, 1st degree Avblock 4. New cardiomyopathy, combined systolic/diastolic CHF Echo shows new CM, LVEF 35-40%, RVEF is also mod reduced with severe elevated pulm pressures LA described as severely enlarged Known longstanding MV disease Echo notes suspected ruptured chorda(e) tendina(e) to the middle scallop of the  posterior mitral leaflet. The severity of the mitral insufficiency jet may  be  underestimated due to its highly eccentric nature. The mitral valve is  myxomatous. Moderate mitral valve regurgitation.  Dr. Marlou Porch following as well.   Rhythm control may prove challenging given his echo findings/MR  Plan for home today with very close AF clinic follow up (scheduled)  For questions or updates, please contact [place] Please consult [email or web address] for contact info under Cardiology/STEMI.  Signed, Shirley Friar, PA-C  09/27/2019, 9:01 AM    I have seen, examined the patient, and reviewed the above assessment and plan.  Changes to above are made where necessary.  On exam, iRRR.  Doing reasonably well.  Ok to discharge with rate control.  He will follow-up in AF clinic next week for additional evaluation and possible titration of his metoprolol. I would anticipate attempted cardioversion in 3 weeks after amiodarone load and appropriate anticoagulation.   Co Sign: Thompson Grayer, MD

## 2019-09-27 NOTE — Progress Notes (Signed)
Occupational Therapy Treatment Patient Details Name: Travis Kelly MRN: [id number] DOB: Aug 15, 1922 Today's Date: 09/27/2019    History of present illness 86 yo male with onset of volume overload was admitted with CHF, UTI and has been in a-fib.  Diuresed now and working toward balance of BP and volume.  PMHx:  L BBB, PSVT, CHF, OA knees, CKD3, mitral regurg, macular degeneration, Atherosclerosis, cardiomegaly, neurogenic bladder   OT comments  Patient continues to make steady progress towards goals in skilled OT session. Patient's session encompassed stand/pivot transfer to the chair to promote activity tolerance. Pt more limited in session to date, requiring significant increased time to complete bed mobility, requiring step by step multi-modal cues and mod A to elevate trunk. Pt required elevated surface, walker and max A to stand pivot, (able to complete on 3rd attempt) and max A to weight shift and advance BLEs. Daughter present through entirety of session, and therapist provided education on how if pt continues to require this much help, independent living may not be the best solution long term. Daughter acknowledged, however determined to continue with current plan. Discharge remains appropriate at this time; will continue to follow acutely.    Follow Up Recommendations  Home health OT;Supervision/Assistance - 24 hour    Equipment Recommendations  None recommended by OT    Recommendations for Other Services      Precautions / Restrictions Precautions Precautions: Fall Precaution Comments: orthostatics - neg with COTA 6/18       Mobility Bed Mobility Overal bed mobility: Needs Assistance Bed Mobility: Supine to Sit     Supine to sit: Mod assist     General bed mobility comments: Mod A to bring trunk to EOB, significant extra time needed to motor plan and complete  Transfers Overall transfer level: Needs assistance Equipment used: Rolling walker (2 wheeled) Transfers: Sit  to/from [employer] Transfers Sit to Stand: Max assist;From elevated surface Stand pivot transfers: Max assist;From elevated surface       General transfer comment: Max A to power up, required 3 attempts to stand, with signifcant posterior lean when standing. Max A to weight shift and advance BLEs to pivot    Balance Overall balance assessment: Needs assistance Sitting-balance support: Feet supported Sitting balance-Leahy Scale: Fair   Postural control: Posterior lean Standing balance support: Bilateral upper extremity supported;During functional activity Standing balance-Leahy Scale: Poor Standing balance comment: reliant on external support; posterior leaning requiring mod assist to correct                           ADL either performed or assessed with clinical judgement   ADL Overall ADL's : Needs assistance/impaired                         Toilet Transfer: RW;Ambulation;Maximal assistance Toilet Transfer Details (indicate cue type and reason): Simulated with recliner, signifcant need for increased time and max A to weight shift and advance BLEs         Functional mobility during ADLs: Maximal assistance;Rolling walker;Cueing for safety;Cueing for sequencing General ADL Comments: Pt with increased lethargy in session, requiring 3 attempts to complete sit<>stands from elevated surface, max A to stand pivot to chair     Vision       Perception     Praxis      Cognition Arousal/Alertness: Awake/alert Behavior During Therapy: WFL for tasks assessed/performed Overall Cognitive Status: Within Functional Limits for  tasks assessed                                          Exercises     Shoulder Instructions       General Comments      Pertinent Vitals/ Pain       Pain Assessment: Faces Faces Pain Scale: Hurts a little bit Pain Location: Generalized Pain Intervention(s): Limited activity within patient's  tolerance;Monitored during session;Repositioned  Home Living                                          Prior Functioning/Environment              Frequency  Min 2X/week        Progress Toward Goals  OT Goals(current goals can now be found in the care plan section)  Progress towards OT goals: Progressing toward goals  Acute Rehab OT Goals Patient Stated Goal: to I living with [facility] OT Goal Formulation: With patient/family Time For Goal Achievement: 10/08/19 Potential to Achieve Goals: Good  Plan Discharge plan remains appropriate    Co-evaluation                 AM-PAC OT "6 Clicks" Daily Activity     Outcome Measure   Help from another person eating meals?: None Help from another person taking care of personal grooming?: A Little Help from another person toileting, which includes using toliet, bedpan, or urinal?: A Lot Help from another person bathing (including washing, rinsing, drying)?: A Lot Help from another person to put on and taking off regular upper body clothing?: A Lot Help from another person to put on and taking off regular lower body clothing?: Total 6 Click Score: 14    End of Session Equipment Utilized During Treatment: Rolling walker;Gait belt  OT Visit Diagnosis: Unsteadiness on feet (R26.81);Other abnormalities of gait and mobility (R26.89)   Activity Tolerance Patient limited by fatigue;Patient limited by lethargy   Patient Left in chair;with call bell/phone within reach;with chair alarm set;with family/visitor present   Nurse Communication Mobility status;Other (comment) (Need to use stedy to return to bed)        Time: 1950-9326 OT Time Calculation (min): 31 min  Charges: OT General Charges $OT Visit: 1 Visit OT Treatments $Self Care/Home Management : 23-37 mins  [place]. Chelan Heringer, COTA/L Acute Rehabilitation Services [place] 09/27/2019, 12:41 PM

## 2019-09-28 ENCOUNTER — Emergency Department (HOSPITAL_COMMUNITY): Payer: No Typology Code available for payment source

## 2019-09-28 DIAGNOSIS — Z79899 Other long term (current) drug therapy: Secondary | ICD-10-CM | POA: Diagnosis not present

## 2019-09-28 DIAGNOSIS — L8990 Pressure ulcer of unspecified site, unspecified stage: Secondary | ICD-10-CM | POA: Insufficient documentation

## 2019-09-28 DIAGNOSIS — Z7189 Other specified counseling: Secondary | ICD-10-CM | POA: Diagnosis not present

## 2019-09-28 DIAGNOSIS — N200 Calculus of kidney: Secondary | ICD-10-CM | POA: Diagnosis not present

## 2019-09-28 DIAGNOSIS — Z515 Encounter for palliative care: Secondary | ICD-10-CM | POA: Diagnosis not present

## 2019-09-28 DIAGNOSIS — R339 Retention of urine, unspecified: Secondary | ICD-10-CM | POA: Diagnosis not present

## 2019-09-28 DIAGNOSIS — I5082 Biventricular heart failure: Secondary | ICD-10-CM | POA: Diagnosis present

## 2019-09-28 DIAGNOSIS — N1832 Chronic kidney disease, stage 3b: Secondary | ICD-10-CM | POA: Diagnosis present

## 2019-09-28 DIAGNOSIS — I4891 Unspecified atrial fibrillation: Secondary | ICD-10-CM | POA: Diagnosis not present

## 2019-09-28 DIAGNOSIS — I5023 Acute on chronic systolic (congestive) heart failure: Secondary | ICD-10-CM | POA: Diagnosis present

## 2019-09-28 DIAGNOSIS — J90 Pleural effusion, not elsewhere classified: Secondary | ICD-10-CM | POA: Diagnosis not present

## 2019-09-28 DIAGNOSIS — M19041 Primary osteoarthritis, right hand: Secondary | ICD-10-CM | POA: Diagnosis present

## 2019-09-28 DIAGNOSIS — R6520 Severe sepsis without septic shock: Secondary | ICD-10-CM | POA: Diagnosis present

## 2019-09-28 DIAGNOSIS — I509 Heart failure, unspecified: Secondary | ICD-10-CM

## 2019-09-28 DIAGNOSIS — A419 Sepsis, unspecified organism: Principal | ICD-10-CM

## 2019-09-28 DIAGNOSIS — I5043 Acute on chronic combined systolic (congestive) and diastolic (congestive) heart failure: Secondary | ICD-10-CM | POA: Diagnosis not present

## 2019-09-28 DIAGNOSIS — Z7401 Bed confinement status: Secondary | ICD-10-CM | POA: Diagnosis not present

## 2019-09-28 DIAGNOSIS — N319 Neuromuscular dysfunction of bladder, unspecified: Secondary | ICD-10-CM | POA: Diagnosis present

## 2019-09-28 DIAGNOSIS — L89301 Pressure ulcer of unspecified buttock, stage 1: Secondary | ICD-10-CM

## 2019-09-28 DIAGNOSIS — M2550 Pain in unspecified joint: Secondary | ICD-10-CM | POA: Diagnosis not present

## 2019-09-28 DIAGNOSIS — I4819 Other persistent atrial fibrillation: Secondary | ICD-10-CM | POA: Diagnosis present

## 2019-09-28 DIAGNOSIS — R0902 Hypoxemia: Secondary | ICD-10-CM | POA: Diagnosis not present

## 2019-09-28 DIAGNOSIS — Z66 Do not resuscitate: Secondary | ICD-10-CM | POA: Diagnosis present

## 2019-09-28 DIAGNOSIS — R000 Tachycardia, unspecified: Secondary | ICD-10-CM | POA: Diagnosis not present

## 2019-09-28 DIAGNOSIS — L89151 Pressure ulcer of sacral region, stage 1: Secondary | ICD-10-CM | POA: Diagnosis present

## 2019-09-28 DIAGNOSIS — J9601 Acute respiratory failure with hypoxia: Secondary | ICD-10-CM | POA: Diagnosis present

## 2019-09-28 DIAGNOSIS — E876 Hypokalemia: Secondary | ICD-10-CM | POA: Diagnosis present

## 2019-09-28 DIAGNOSIS — Z20822 Contact with and (suspected) exposure to covid-19: Secondary | ICD-10-CM | POA: Diagnosis present

## 2019-09-28 DIAGNOSIS — E785 Hyperlipidemia, unspecified: Secondary | ICD-10-CM | POA: Diagnosis present

## 2019-09-28 DIAGNOSIS — N390 Urinary tract infection, site not specified: Secondary | ICD-10-CM | POA: Diagnosis present

## 2019-09-28 DIAGNOSIS — R338 Other retention of urine: Secondary | ICD-10-CM | POA: Diagnosis not present

## 2019-09-28 DIAGNOSIS — N179 Acute kidney failure, unspecified: Secondary | ICD-10-CM | POA: Diagnosis present

## 2019-09-28 DIAGNOSIS — I5042 Chronic combined systolic (congestive) and diastolic (congestive) heart failure: Secondary | ICD-10-CM | POA: Diagnosis not present

## 2019-09-28 DIAGNOSIS — I517 Cardiomegaly: Secondary | ICD-10-CM | POA: Diagnosis not present

## 2019-09-28 DIAGNOSIS — I959 Hypotension, unspecified: Secondary | ICD-10-CM | POA: Diagnosis not present

## 2019-09-28 LAB — CBC
HCT: 35.9 % — ABNORMAL LOW (ref 39.0–52.0)
HCT: 36 % — ABNORMAL LOW (ref 39.0–52.0)
Hemoglobin: 11.9 g/dL — ABNORMAL LOW (ref 13.0–17.0)
Hemoglobin: 11.9 g/dL — ABNORMAL LOW (ref 13.0–17.0)
MCH: 33.6 pg (ref 26.0–34.0)
MCH: 34.1 pg — ABNORMAL HIGH (ref 26.0–34.0)
MCHC: 33.1 g/dL (ref 30.0–36.0)
MCHC: 33.1 g/dL (ref 30.0–36.0)
MCV: 101.7 fL — ABNORMAL HIGH (ref 80.0–100.0)
MCV: 102.9 fL — ABNORMAL HIGH (ref 80.0–100.0)
Platelets: 240 10*3/uL (ref 150–400)
Platelets: 260 10*3/uL (ref 150–400)
RBC: 3.49 MIL/uL — ABNORMAL LOW (ref 4.22–5.81)
RBC: 3.54 MIL/uL — ABNORMAL LOW (ref 4.22–5.81)
RDW: 13.7 % (ref 11.5–15.5)
RDW: 13.9 % (ref 11.5–15.5)
WBC: 15.5 10*3/uL — ABNORMAL HIGH (ref 4.0–10.5)
WBC: 17.7 10*3/uL — ABNORMAL HIGH (ref 4.0–10.5)
nRBC: 0 % (ref 0.0–0.2)
nRBC: 0 % (ref 0.0–0.2)

## 2019-09-28 LAB — URINALYSIS, ROUTINE W REFLEX MICROSCOPIC
Bacteria, UA: NONE SEEN
Bilirubin Urine: NEGATIVE
Glucose, UA: NEGATIVE mg/dL
Hgb urine dipstick: NEGATIVE
Ketones, ur: NEGATIVE mg/dL
Nitrite: NEGATIVE
Protein, ur: NEGATIVE mg/dL
Specific Gravity, Urine: 1.014 (ref 1.005–1.030)
pH: 5 (ref 5.0–8.0)

## 2019-09-28 LAB — BASIC METABOLIC PANEL
Anion gap: 18 — ABNORMAL HIGH (ref 5–15)
Anion gap: 9 (ref 5–15)
BUN: 30 mg/dL — ABNORMAL HIGH (ref 8–23)
BUN: 32 mg/dL — ABNORMAL HIGH (ref 8–23)
CO2: 19 mmol/L — ABNORMAL LOW (ref 22–32)
CO2: 28 mmol/L (ref 22–32)
Calcium: 8.2 mg/dL — ABNORMAL LOW (ref 8.9–10.3)
Calcium: 8.4 mg/dL — ABNORMAL LOW (ref 8.9–10.3)
Chloride: 100 mmol/L (ref 98–111)
Chloride: 97 mmol/L — ABNORMAL LOW (ref 98–111)
Creatinine, Ser: 1.33 mg/dL — ABNORMAL HIGH (ref 0.61–1.24)
Creatinine, Ser: 1.48 mg/dL — ABNORMAL HIGH (ref 0.61–1.24)
GFR calc Af Amer: 46 mL/min — ABNORMAL LOW (ref >60)
GFR calc Af Amer: 52 mL/min — ABNORMAL LOW (ref >60)
GFR calc non Af Amer: 40 mL/min — ABNORMAL LOW (ref >60)
GFR calc non Af Amer: 45 mL/min — ABNORMAL LOW (ref >60)
Glucose, Bld: 117 mg/dL — ABNORMAL HIGH (ref 70–99)
Glucose, Bld: 91 mg/dL (ref 70–99)
Potassium: 4.2 mmol/L (ref 3.5–5.1)
Potassium: 4.7 mmol/L (ref 3.5–5.1)
Sodium: 134 mmol/L — ABNORMAL LOW (ref 135–145)
Sodium: 137 mmol/L (ref 135–145)

## 2019-09-28 LAB — SARS CORONAVIRUS 2 BY RT PCR (HOSPITAL ORDER, PERFORMED IN ~~LOC~~ HOSPITAL LAB): SARS Coronavirus 2: NEGATIVE

## 2019-09-28 LAB — MAGNESIUM: Magnesium: 2 mg/dL (ref 1.7–2.4)

## 2019-09-28 LAB — LACTIC ACID, PLASMA
Lactic Acid, Venous: 2.4 mmol/L (ref 0.5–1.9)
Lactic Acid, Venous: 1.7 mmol/L (ref 0.5–1.9)

## 2019-09-28 LAB — MRSA PCR SCREENING: MRSA by PCR: NEGATIVE

## 2019-09-28 MED ORDER — SODIUM CHLORIDE 0.9 % IV BOLUS
250.0000 mL | Freq: Once | INTRAVENOUS | Status: AC
  Administered 2019-09-28: 250 mL via INTRAVENOUS

## 2019-09-28 MED ORDER — SENNA 8.6 MG PO TABS
1.0000 | ORAL_TABLET | Freq: Every evening | ORAL | Status: DC
  Administered 2019-09-28 – 2019-10-05 (×7): 8.6 mg via ORAL
  Filled 2019-09-28 (×7): qty 1

## 2019-09-28 MED ORDER — FUROSEMIDE 40 MG PO TABS
40.0000 mg | ORAL_TABLET | Freq: Every day | ORAL | Status: DC
  Administered 2019-09-28: 40 mg via ORAL
  Filled 2019-09-28: qty 1

## 2019-09-28 MED ORDER — CHLORHEXIDINE GLUCONATE CLOTH 2 % EX PADS
6.0000 | MEDICATED_PAD | Freq: Every day | CUTANEOUS | Status: DC
  Administered 2019-09-28 – 2019-10-06 (×9): 6 via TOPICAL

## 2019-09-28 MED ORDER — AMIODARONE HCL 200 MG PO TABS
200.0000 mg | ORAL_TABLET | Freq: Every day | ORAL | Status: DC
  Administered 2019-09-28 – 2019-10-03 (×6): 200 mg via ORAL
  Filled 2019-09-28 (×6): qty 1

## 2019-09-28 MED ORDER — SODIUM CHLORIDE 0.9 % IV BOLUS
1000.0000 mL | Freq: Once | INTRAVENOUS | Status: AC
  Administered 2019-09-28: 1000 mL via INTRAVENOUS

## 2019-09-28 MED ORDER — PIPERACILLIN-TAZOBACTAM 3.375 G IVPB: 3.3750 g | Freq: Three times a day (TID) | INTRAVENOUS | Status: DC

## 2019-09-28 MED ORDER — PIPERACILLIN-TAZOBACTAM 3.375 G IVPB 30 MIN
3.3750 g | Freq: Once | INTRAVENOUS | Status: AC
  Administered 2019-09-28: 3.375 g via INTRAVENOUS
  Filled 2019-09-28: qty 50

## 2019-09-28 MED ORDER — FINASTERIDE 5 MG PO TABS
5.0000 mg | ORAL_TABLET | Freq: Every day | ORAL | Status: DC
  Administered 2019-09-28 – 2019-10-06 (×9): 5 mg via ORAL
  Filled 2019-09-28 (×9): qty 1

## 2019-09-28 MED ORDER — VANCOMYCIN HCL 1500 MG/300ML IV SOLN
1500.0000 mg | Freq: Once | INTRAVENOUS | Status: AC
  Administered 2019-09-28: 1500 mg via INTRAVENOUS
  Filled 2019-09-28 (×2): qty 300

## 2019-09-28 MED ORDER — ACETAMINOPHEN 650 MG RE SUPP: 650.0000 mg | Freq: Four times a day (QID) | RECTAL | Status: DC | PRN

## 2019-09-28 MED ORDER — ORAL CARE MOUTH RINSE
15.0000 mL | Freq: Two times a day (BID) | OROMUCOSAL | Status: DC
  Administered 2019-09-28 – 2019-10-06 (×16): 15 mL via OROMUCOSAL

## 2019-09-28 MED ORDER — APIXABAN 5 MG PO TABS
5.0000 mg | ORAL_TABLET | Freq: Two times a day (BID) | ORAL | Status: DC
  Administered 2019-09-28 – 2019-10-06 (×17): 5 mg via ORAL
  Filled 2019-09-28 (×17): qty 1

## 2019-09-28 MED ORDER — POLYVINYL ALCOHOL 1.4 % OP SOLN
1.0000 [drp] | Freq: Every day | OPHTHALMIC | Status: DC | PRN
  Filled 2019-09-28: qty 15

## 2019-09-28 MED ORDER — FLUTICASONE PROPIONATE 50 MCG/ACT NA SUSP
1.0000 | Freq: Every day | NASAL | Status: DC | PRN
  Filled 2019-09-28: qty 16

## 2019-09-28 MED ORDER — METOPROLOL TARTRATE 25 MG PO TABS
50.0000 mg | ORAL_TABLET | Freq: Two times a day (BID) | ORAL | Status: DC
  Administered 2019-09-28: 50 mg via ORAL
  Filled 2019-09-28: qty 2

## 2019-09-28 MED ORDER — METOPROLOL TARTRATE 25 MG PO TABS
50.0000 mg | ORAL_TABLET | Freq: Two times a day (BID) | ORAL | Status: DC
  Administered 2019-09-29 – 2019-09-30 (×4): 50 mg via ORAL
  Filled 2019-09-28 (×4): qty 2

## 2019-09-28 MED ORDER — SODIUM CHLORIDE 0.9 % IV BOLUS: 500.0000 mL | Freq: Once | INTRAVENOUS | Status: DC

## 2019-09-28 MED ORDER — CIPROFLOXACIN IN D5W 400 MG/200ML IV SOLN
400.0000 mg | Freq: Once | INTRAVENOUS | Status: DC
  Administered 2019-09-28: 400 mg via INTRAVENOUS
  Filled 2019-09-28: qty 200

## 2019-09-28 MED ORDER — VANCOMYCIN HCL 750 MG/150ML IV SOLN: 750.0000 mg | INTRAVENOUS | Status: DC

## 2019-09-28 MED ORDER — ACETAMINOPHEN 325 MG PO TABS
650.0000 mg | ORAL_TABLET | Freq: Four times a day (QID) | ORAL | Status: DC | PRN
  Administered 2019-09-29 – 2019-10-05 (×5): 650 mg via ORAL
  Filled 2019-09-28 (×5): qty 2

## 2019-09-28 NOTE — ED Notes (Signed)
Attempted to call report, unable to reach receiving Nurse at this time. Advised to call back in few minutes. Charge Nurse Domenic Schwab made aware.

## 2019-09-28 NOTE — H&P (Signed)
History and Physical    Dal Blew Gaglio MWU:[id number] DOB: 1923/01/10 DOA: 09/27/2019  PCP: [name], MD Patient coming from: [facility] with  Chief Complaint: Problem with self-catheterization  HPI: Travis Kelly is a 86 y.o. male with medical history significant of chronic combined systolic and diastolic CHF, PSVT, PAF, moderate MR, hyperlipidemia, CKD stage III, chronic neurogenic bladder (in/out catheterization at home), recent admission for decompensated heart failure discharged from the hospital yesterday presenting to the ED due to inability to self catheterize at home.  Patient states since leaving the hospital yesterday has not been able to self catheterize at home.  He has no other complaints.  Denies fevers, chills, dysuria, suprapubic pain/pressure, abdominal pain, or flank pain.  Denies cough, shortness of breath, or chest pain.  ED Course: Afebrile.  Slightly tachycardic and tachypneic.  Hypotensive with systolic as low as 72Z.  WBC count 17.7.  Lactic acid 2.4.  Hemoglobin 11.9, at baseline.  Creatinine 1.4, not significantly changed from baseline.  UA with negative nitrite, trace amount of leukocytes, 0-5 WBCs, and no bacteria.  Blood culture x2 pending.  SARS-CoV-2 PCR test negative.  Chest x-ray showing no active disease.  CT renal stone study showing moderate symmetric bilateral perinephric edema.  Nonobstructing 7 mm stone in the lower right kidney.  Foley placed for bladder decompression in the ED.  Patient was given 1 L normal saline bolus and ciprofloxacin.  Review of Systems:  All systems reviewed and apart from history of presenting illness, are negative.  Past Medical History:  Diagnosis Date  . Age-related macular degeneration, dry, left eye   . Age-related macular degeneration, wet, right eye ([place])   . Arthritis    "right knee" (04/17/2018)  . CHF (congestive heart failure) ([place])   . GERD (gastroesophageal reflux disease)   . Heart murmur   . History of  duodenal ulcer   . History of hiatal hernia   . LAFB (left anterior fascicular block)   . Mitral valve disorder    Moderate MR  . Nephrolithiasis 2013  . Pneumonia 1938; ?date   "once as a child; once as an adult" (04/17/2018)  . PSVT (paroxysmal supraventricular tachycardia) ([place]) 06/2016    Past Surgical History:  Procedure Laterality Date  . CARDIAC CATHETERIZATION    . CATARACT EXTRACTION W/ INTRAOCULAR LENS  IMPLANT, BILATERAL Bilateral 2013  . SKIN BIOPSY Left    "thigh; suspected CA; it was not"     reports that he has quit smoking. He has never used smokeless tobacco. He reports current alcohol use of about 3.0 standard drinks of alcohol per week. He reports that he does not use drugs.  Allergies  Allergen Reactions  . Rocephin [Ceftriaxone Sodium In Dextrose] Nausea And Vomiting and Other (See Comments)     Hallucinations  Tolerated Keflex course 09/2019  . Barbital Swelling, Rash and Other (See Comments)    Blue lips and swollen tongue    Family History  Problem Relation Age of Onset  . Cancer Mother   . Pneumonia Father     Prior to Admission medications   Medication Sig Start Date End Date Taking? Authorizing Provider  acetaminophen (TYLENOL) 500 MG tablet Take 1,000 mg by mouth daily as needed for headache (pain).     [provider]  amiodarone (PACERONE) 200 MG tablet Take 1 tablet (200 mg total) by mouth daily. 09/28/19 10/28/19  Ghimire, Henreitta Leber, MD  apixaban (ELIQUIS) 5 MG TABS tablet Take 1 tablet (5 mg total)  by mouth 2 (two) times daily. 09/27/19   Ghimire, Henreitta Leber, MD  B Complex-C (SUPER B COMPLEX PO) Take 1 tablet by mouth every evening.    [provider]  Calcium Carb-Cholecalciferol (CALCIUM 500 +D PO) Take 500 mg by mouth every evening.    [provider]  cholecalciferol (VITAMIN D3) 25 MCG (1000 UNIT) tablet Take 1,000 Units by mouth every evening.     [provider]  finasteride (PROSCAR) 5 MG tablet Take 5  mg by mouth daily. 07/25/13   [provider]  fluticasone (FLONASE) 50 MCG/ACT nasal spray Place 1-2 sprays into both nostrils daily as needed for allergies or rhinitis.    [provider]  furosemide (LASIX) 20 MG tablet Take 2 tablets (40 mg total) by mouth daily. 09/27/19   Ghimire, Henreitta Leber, MD  Krill Oil 500 MG CAPS Take 500 mg by mouth every evening.    [provider]  Lutein 20 MG CAPS Take 20 mg by mouth every evening.     [provider]  metoprolol tartrate (LOPRESSOR) 25 MG tablet Take 2 tablets (50 mg total) by mouth 2 (two) times daily. 09/27/19 10/27/19  Ghimire, Henreitta Leber, MD  Multiple Vitamins-Minerals ([place]) CAPS Take 1 capsule by mouth every evening.     [provider]  polyvinyl alcohol (ARTIFICIAL TEARS) 1.4 % ophthalmic solution Place 1 drop into both eyes daily as needed for dry eyes.    [provider]  senna (SENOKOT) 8.6 MG TABS tablet Take 1 tablet by mouth every evening.    [provider]  vitamin C (ASCORBIC ACID) 500 MG tablet Take 500 mg by mouth every evening.     [provider]    Physical Exam: Vitals:   09/28/19 0206 09/28/19 0232 09/28/19 0317 09/28/19 0420  BP:  107/86  106/73  Pulse: (!) 101 (!) 112 100 (!) 106  Resp: (!) 23 (!) 25 (!) 24 (!) 22  Temp:      TempSrc:      SpO2: 92% 96% 94% 94%  Weight:      Height:        Physical Exam Constitutional:      General: He is not in acute distress. HENT:     Head: Normocephalic.  Eyes:     Extraocular Movements: Extraocular movements intact.  Cardiovascular:     Rate and Rhythm: Normal rate and regular rhythm.     Pulses: Normal pulses.  Pulmonary:     Effort: Pulmonary effort is normal.     Breath sounds: No wheezing or rales.     Comments: Decreased breath sounds at the bases Abdominal:     General: Bowel sounds are normal. There is no distension.     Palpations: Abdomen is soft.     Tenderness:  There is no abdominal tenderness. There is no guarding.  Musculoskeletal:     Cervical back: Normal range of motion and neck supple. No rigidity.     Right lower leg: Edema present.     Left lower leg: Edema present.     Comments: +2 to +3 pitting edema of bilateral lower extremities  Skin:    General: Skin is warm and dry.  Neurological:     Mental Status: He is alert and oriented to person, place, and time.  Psychiatric:        Mood and Affect: Mood normal.        Behavior: Behavior normal.  Labs on Admission: I have personally reviewed following labs and imaging studies  CBC: Recent Labs  Lab 09/22/19 1233 09/24/19 0230 09/27/19 2356  WBC 16.8* 7.8 17.7*  NEUTROABS 14.8*  --   --   HGB 12.8* 11.4* 11.9*  HCT 40.0 34.7* 36.0*  MCV 105.5* 102.7* 101.7*  PLT 171 200 301   Basic Metabolic Panel: Recent Labs  Lab 09/24/19 0230 09/25/19 0336 09/26/19 0305 09/27/19 0312 09/27/19 2356  NA 139 141 140 136 134*  K 3.8 4.5 3.6 3.6 4.2  CL 102 102 101 97* 97*  CO2 27 29 27 29 28   GLUCOSE 104* 108* 119* 115* 117*  BUN 38* 32* 28* 24* 30*  CREATININE 1.55* 1.39* 1.37* 1.22 1.48*  CALCIUM 8.5* 9.2 8.7* 8.6* 8.2*   GFR: Estimated Creatinine Clearance: 29.5 mL/min (A) (by C-G formula based on SCr of 1.48 mg/dL (H)). Liver Function Tests: No results for input(s): AST, ALT, ALKPHOS, BILITOT, PROT, ALBUMIN in the last 168 hours. No results for input(s): LIPASE, AMYLASE in the last 168 hours. No results for input(s): AMMONIA in the last 168 hours. Coagulation Profile: No results for input(s): INR, PROTIME in the last 168 hours. Cardiac Enzymes: No results for input(s): CKTOTAL, CKMB, CKMBINDEX, TROPONINI in the last 168 hours. BNP (last 3 results) Recent Labs    10/03/18 1459  PROBNP 4,125*   HbA1C: No results for input(s): HGBA1C in the last 72 hours. CBG: No results for input(s): GLUCAP in the last 168 hours. Lipid Profile: No results for input(s): CHOL, HDL,  LDLCALC, TRIG, CHOLHDL, LDLDIRECT in the last 72 hours. Thyroid Function Tests: No results for input(s): TSH, T4TOTAL, FREET4, T3FREE, THYROIDAB in the last 72 hours. Anemia Panel: No results for input(s): VITAMINB12, FOLATE, FERRITIN, TIBC, IRON, RETICCTPCT in the last 72 hours. Urine analysis:    Component Value Date/Time   COLORURINE AMBER (A) 09/27/2019 2355   APPEARANCEUR CLEAR 09/27/2019 2355   LABSPEC 1.014 09/27/2019 2355   PHURINE 5.0 09/27/2019 2355   GLUCOSEU NEGATIVE 09/27/2019 2355   HGBUR NEGATIVE 09/27/2019 2355   BILIRUBINUR NEGATIVE 09/27/2019 2355   KETONESUR NEGATIVE 09/27/2019 2355   PROTEINUR NEGATIVE 09/27/2019 2355   NITRITE NEGATIVE 09/27/2019 2355   LEUKOCYTESUR TRACE (A) 09/27/2019 2355    Radiological Exams on Admission: DG Chest Port 1 View  Result Date: 09/28/2019 CLINICAL DATA:  Hypotension EXAM: PORTABLE CHEST 1 VIEW COMPARISON:  September 22, 2019 FINDINGS: There is mild cardiomegaly. Aortic knob calcifications. Both lungs are clear. No focal airspace consolidation or pleural effusion. Advanced left shoulder osteoarthritis is seen with joint space loss. IMPRESSION: No active disease. Electronically Signed   By: Prudencio Pair M.D.   On: 09/28/2019 02:37   CT RENAL STONE STUDY  Result Date: 09/28/2019 CLINICAL DATA:  86 year old with inability to self catheterization. Question kidney stone versus intra-abdominal infection. Recent hospitalization for urinary tract infection. EXAM: CT ABDOMEN AND PELVIS WITHOUT CONTRAST TECHNIQUE: Multidetector CT imaging of the abdomen and pelvis was performed following the standard protocol without IV contrast. COMPARISON:  None. FINDINGS: Lower chest: Mild cardiomegaly. Small bilateral pleural effusions, left greater than right with adjacent compressive atelectasis. There are coronary artery calcifications. Hepatobiliary: No evidence of focal hepatic lesion. Possible stones or sludge in the gallbladder. No pericholecystic  inflammation. There is no biliary dilatation. Pancreas: No ductal dilatation or inflammation. Spleen: Normal in size.  No focal abnormality on noncontrast exam. Adrenals/Urinary Tract: No adrenal nodule. Moderate symmetric bilateral perinephric edema. No hydronephrosis. Nonobstructing 7 mm stone in  the lower right kidney. There are bilateral simple cysts in both kidneys. Both ureters are decompressed without stones along the course. There are multiple, stones in the urinary bladder. Largest stone is in the region of the left urinary trigone measuring 8 mm. Additional calcifications in the midline may be bladder wall calcifications versus stones. Bladder is decompressed by Foley catheter. Diffuse bladder wall thickening with mild perivesicular fat stranding. Stomach/Bowel: Tiny hiatal hernia. The stomach is nondistended. There is no evidence of bowel obstruction. Normal appendix. Moderate colonic stool burden. Mild distal colonic diverticulosis without diverticulitis. Vascular/Lymphatic: Aorto bi-iliac atherosclerosis and tortuosity. No aneurysm. No bulky adenopathy. Reproductive: Prominent prostate spanning 4.3 cm transverse. Other: Generalized subcutaneous soft tissue edema involving the flanks and suprapubic soft tissues. Small amount of free fluid in the presacral region, pelvis, and bilateral upper quadrants. No free air. No evidence of focal abscess or fluid collection. Musculoskeletal: Diffuse multilevel degenerative change throughout the spine. There are no acute or suspicious osseous abnormalities. IMPRESSION: 1. Moderate symmetric bilateral perinephric edema, can be seen with urinary tract infection. 2. Nonobstructing 7 mm stone in the lower right kidney. 3. Urinary bladder decompressed by Foley catheter. There are stones in the bladder. There may also be bladder wall calcifications. 4. Generalized subcutaneous soft tissue edema consistent with third-spacing. Small amount of free fluid in the abdomen and  pelvis. Small bilateral pleural effusions, left greater than right with adjacent compressive atelectasis. 5. Possible stones or sludge in the gallbladder. No pericholecystic inflammation. 6. Colonic diverticulosis without diverticulitis. Aortic Atherosclerosis (ICD10-I70.0). Electronically Signed   By: Keith Rake M.D.   On: 09/28/2019 03:28    EKG: Independently reviewed.  A. fib, heart rate 114.  PVCs.  QTC 550.  No significant change since prior tracing.  Assessment/Plan Principal Problem:   Severe sepsis (HCC) Active Problems:   Acute urinary retention   Neurogenic bladder   Atrial fibrillation with RVR (HCC)   CHF (congestive heart failure) (HCC)   Severe sepsis from unknown source: Tachycardic, tachypneic, and hypotensive on arrival.  Labs showing significant leukocytosis and mild lactic acidosis.  Chest x-ray not suggestive of pneumonia.  SARS-CoV-2 PCR test negative.  CT showing moderate symmetric bilateral perinephric edema.  However, UA does not appear to be suggestive of infection (negative nitrite, trace leukocytes, 0-5 WBCs, and no bacteria).  Patient denies flank or suprapubic pain.  No meningeal signs or altered mental status to suggest meningitis. -Since source of infection is not entirely clear, give broad-spectrum antibiotics including vancomycin and Zosyn at this time.  Patient received 1 L normal saline bolus, blood pressure has now improved.  Give fluids judiciously given history of CHF and valvular abnormalities.  Add on urine culture.  Blood culture x2 pending.  Continue to monitor WBC count.  Trend lactate.  Check procalcitonin level.  Acute urinary retention in setting of chronic neurogenic bladder: Presented with acute urinary retention due to not being able to self catheterize at home.  Foley was placed for bladder decompression in the ED. -Continue Foley and monitor urine output.  Consult urology in a.m.  Persistent A. fib with RVR: Heart rate currently in the  90-110 range in the setting of severe sepsis/underlying infection. -Heart rate has now slightly improved after IV fluid bolus.  Hold metoprolol at this time given hypotension on arrival.  Resume Eliquis after pharmacy med rec is done.  Takes amiodarone at home and has QT prolongation on EKG.  (QTC 550),  consider discussing with cardiology in the morning.  Chronic combined systolic and diastolic CHF: Recent echo done 09/24/2019 with LVEF 25 to 40% and grade 2 diastolic dysfunction.  Does have significant peripheral edema on exam.  CT renal stone study showing small bilateral pleural effusions.  Chest x-ray without overt pulmonary edema.  Not hypoxic. -Hold diuretic at this time given hypotension/severe sepsis  CKD stage III: Creatinine 1.4, not significantly changed from baseline.  QT prolongation on EKG: QTC 550 and appears stable compared to EKG done 09/22/2019.  Takes amiodarone for chronic A. fib which is likely contributing. -Cardiac monitoring, keep potassium above 4 and magnesium above 2, repeat EKG in a.m. consider discussing with cardiology in the morning.  DVT prophylaxis: Resume Eliquis after pharmacy med rec is done. Code Status: DNR-signed form at bedside Family Communication: Son updated. Disposition Plan: Status is: Inpatient  Remains inpatient appropriate because:Hemodynamically unstable, IV treatments appropriate due to intensity of illness or inability to take PO and Inpatient level of care appropriate due to severity of illness   Dispo: The patient is from: Home              Anticipated d/c is to: SNF              Anticipated d/c date is: > 3 days              Patient currently is not medically stable to d/c.  The medical decision making on this patient was of high complexity and the patient is at high risk for clinical deterioration, therefore this is a level 3 visit.  Shela Leff MD [facility]  If 7PM-7AM, please contact  night-coverage [email or web address]  09/28/2019, 5:38 AM

## 2019-09-28 NOTE — ED Provider Notes (Signed)
[facility] Emergency Department Provider Note MRN:  [id number]  Arrival date & time: 09/28/19     Chief Complaint   Urinary Retention   History of Present Illness   Travis Kelly is a 86 y.o. year-old male with a history of CHF, neurogenic bladder presenting to the ED with chief complaint of urinary retention.  Patient explains that he was discharged from the hospital this afternoon, went back to his independent living facility.  Was having trouble self cathing, explaining that he was having trouble retracting the foreskin.  For other people at the care facility try to help him with the catheterization and no one was able to catheterize him.  He is here for urinary retention.  Endorsing some fullness to the lower abdomen but no other complaints.  Denies fever, no cough, no chest pain, no shortness of breath.  Review of Systems  A complete 10 system review of systems was obtained and all systems are negative except as noted in the HPI and PMH.   Patient's Health History    Past Medical History:  Diagnosis Date  . Age-related macular degeneration, dry, left eye   . Age-related macular degeneration, wet, right eye ([place])   . Arthritis    "right knee" (04/17/2018)  . CHF (congestive heart failure) ([place])   . GERD (gastroesophageal reflux disease)   . Heart murmur   . History of duodenal ulcer   . History of hiatal hernia   . LAFB (left anterior fascicular block)   . Mitral valve disorder    Moderate MR  . Nephrolithiasis 2013  . Pneumonia 1938; ?date   "once as a child; once as an adult" (04/17/2018)  . PSVT (paroxysmal supraventricular tachycardia) ([place]) 06/2016    Past Surgical History:  Procedure Laterality Date  . CARDIAC CATHETERIZATION    . CATARACT EXTRACTION W/ INTRAOCULAR LENS  IMPLANT, BILATERAL Bilateral 2013  . SKIN BIOPSY Left    "thigh; suspected CA; it was not"    Family History  Problem Relation Age of Onset  . Cancer Mother   .  Pneumonia Father     Social History   Socioeconomic History  . Marital status: Widowed    Spouse name: Not on file  . Number of children: Not on file  . Years of education: Not on file  . Highest education level: Not on file  Occupational History  . Not on file  Tobacco Use  . Smoking status: Former Research scientist (life sciences)  . Smokeless tobacco: Never Used  . Tobacco comment: 04/17/2018 "quit smoking when I was 14"  Vaping Use  . Vaping Use: Never used  Substance and Sexual Activity  . Alcohol use: Yes    Alcohol/week: 3.0 standard drinks    Types: 3 Glasses of wine per week  . Drug use: Never  . Sexual activity: Not on file  Other Topics Concern  . Not on file  Social History Narrative  . Not on file   Social Determinants of Health   Financial Resource Strain:   . Difficulty of Paying Living Expenses:   Food Insecurity:   . Worried About Charity fundraiser in the Last Year:   . Arboriculturist in the Last Year:   Transportation Needs:   . Film/video editor (Medical):   Marland Kitchen Lack of Transportation (Non-Medical):   Physical Activity:   . Days of Exercise per Week:   . Minutes of Exercise per Session:   Stress:   . Feeling  of Stress :   Social Connections:   . Frequency of Communication with Friends and Family:   . Frequency of Social Gatherings with Friends and Family:   . Attends Religious Services:   . Active Member of Clubs or Organizations:   . Attends Archivist Meetings:   Marland Kitchen Marital Status:   Intimate Partner Violence:   . Fear of Current or Ex-Partner:   . Emotionally Abused:   Marland Kitchen Physically Abused:   . Sexually Abused:      Physical Exam   Vitals:   09/28/19 0500 09/28/19 0623  BP: 104/76   Pulse: 96   Resp: (!) 24   Temp:  (!) 97 F (36.1 C)  SpO2: 94%     CONSTITUTIONAL: Chronically ill-appearing, NAD NEURO:  Alert and oriented x 3, no focal deficits EYES:  eyes equal and reactive ENT/NECK:  no LAD, no JVD CARDIO: Tachycardic rate,  well-perfused, normal S1 and S2 PULM:  CTAB no wheezing or rhonchi GI/GU:  normal bowel sounds, non-distended, non-tender MSK/SPINE:  No gross deformities, no edema SKIN:  no rash, atraumatic PSYCH:  Appropriate speech and behavior  *Additional and/or pertinent findings included in MDM below  Diagnostic and Interventional Summary    EKG Interpretation  Date/Time:  Saturday September 28 2019 00:37:37 EDT Ventricular Rate:  114 PR Interval:    QRS Duration: 142 QT Interval:  399 QTC Calculation: 550 R Axis:   -91 Text Interpretation: Atrial fibrillation Ventricular premature complex Nonspecific IVCD with LAD Consider anterior infarct Confirmed by Gerlene Fee [phone]) on 09/28/2019 12:58:38 AM      Labs Reviewed  URINALYSIS, ROUTINE W REFLEX MICROSCOPIC - Abnormal; Notable for the following components:      Result Value   Color, Urine AMBER (*)    Leukocytes,Ua TRACE (*)    All other components within normal limits  BASIC METABOLIC PANEL - Abnormal; Notable for the following components:   Sodium 134 (*)    Chloride 97 (*)    Glucose, Bld 117 (*)    BUN 30 (*)    Creatinine, Ser 1.48 (*)    Calcium 8.2 (*)    GFR calc non Af Amer 40 (*)    GFR calc Af Amer 46 (*)    All other components within normal limits  CBC - Abnormal; Notable for the following components:   WBC 17.7 (*)    RBC 3.54 (*)    Hemoglobin 11.9 (*)    HCT 36.0 (*)    MCV 101.7 (*)    All other components within normal limits  LACTIC ACID, PLASMA - Abnormal; Notable for the following components:   Lactic Acid, Venous 2.4 (*)    All other components within normal limits  URINE CULTURE  SARS CORONAVIRUS 2 BY RT PCR (HOSPITAL ORDER, [facility])  CULTURE, BLOOD (SINGLE)  URINE CULTURE  MRSA PCR SCREENING  CBC  PROCALCITONIN  LACTIC ACID, PLASMA  MAGNESIUM    CT RENAL STONE STUDY  Final Result    DG Chest Port 1 View  Final Result      Medications  acetaminophen (TYLENOL)  tablet 650 mg (has no administration in time range)    Or  acetaminophen (TYLENOL) suppository 650 mg (has no administration in time range)  vancomycin (VANCOREADY) IVPB 1500 mg/300 mL (has no administration in time range)  Chlorhexidine Gluconate Cloth 2 % PADS 6 each (6 each Topical Given 09/28/19 0636)  MEDLINE mouth rinse (has no administration in time  range)  sodium chloride 0.9 % bolus 1,000 mL (0 mLs Intravenous Stopped 09/28/19 0208)  piperacillin-tazobactam (ZOSYN) IVPB 3.375 g (3.375 g Intravenous New Bag/Given 09/28/19 0546)  sodium chloride 0.9 % bolus 250 mL (250 mLs Intravenous New Bag/Given 09/28/19 0543)     Procedures  /  Critical Care BLADDER CATHETERIZATION  Date/Time: 09/28/2019 12:34 AM Performed by: Maudie Flakes, MD Authorized by: Maudie Flakes, MD   Consent:    Consent obtained:  Verbal   Consent given by:  Patient   Risks discussed:  Urethral injury, incomplete procedure, pain, infection and false passage Pre-procedure details:    Procedure purpose:  Therapeutic   Preparation: Patient was prepped and draped in usual sterile fashion   Anesthesia (see MAR for exact dosages):    Anesthesia method:  None Procedure details:    Provider performed due to:  Nurse unable to complete   Catheter insertion:  Indwelling   Catheter type:  Foley   Bladder irrigation: no     Number of attempts:  1   Urine characteristics:  Cloudy Post-procedure details:    Patient tolerance of procedure:  Tolerated well, no immediate complications  .Critical Care Performed by: Maudie Flakes, MD Authorized by: Maudie Flakes, MD   Critical care provider statement:    Critical care time (minutes):  45   Critical care was necessary to treat or prevent imminent or life-threatening deterioration of the following conditions: A. fib with RVR.   Critical care was time spent personally by me on the following activities:  Discussions with consultants, evaluation of patient's response to  treatment, examination of patient, ordering and performing treatments and interventions, ordering and review of laboratory studies, ordering and review of radiographic studies, pulse oximetry, re-evaluation of patient's condition, obtaining history from patient or surrogate and review of old charts    ED Course and Medical Decision Making  I have reviewed the triage vital signs, the nursing notes, and pertinent available records from the EMR.  Listed above are laboratory and imaging tests that I personally ordered, reviewed, and interpreted and then considered in my medical decision making (see below for details).      Patient arrives for simple urinary retention due to inability to self cath, however he is with soft blood pressures, mildly tachycardic.  Oral temperature is normal, will obtain rectal temperature.  Recent admission for CHF exacerbation.  Nurse tech having issues passing the Foley, and so I was able to assist and successfully catheterize the bladder.  Urine is cloudy, there is concern for urinary tract infection possibly with early systemic symptoms.  Awaiting labs, urinalysis.  Patient is in no acute distress.  Work-up not initially providing obvious source of infection, followed up with CT renal study which is suggestive of urinary tract infection or cystitis.  Admitted to hospital service for further care.  Barth Kirks. Sedonia Small, [place] [email or web address]  Final Clinical Impressions(s) / ED Diagnoses     ICD-10-CM   1. Atrial fibrillation with RVR (HCC)  I48.91   2. Urinary retention  R33.9   3. Lower urinary tract infectious disease  N39.0     ED Discharge Orders    None       Discharge Instructions Discussed with and Provided to Patient:   Discharge Instructions   None       Maudie Flakes, MD 09/28/19 [phone]

## 2019-09-28 NOTE — Progress Notes (Signed)
Pharmacy Antibiotic Note  Travis Kelly is a 86 y.o. male admitted on 09/27/2019 with sepsis.  Pharmacy has been consulted for Vancomycin and Zosyn dosing.  Plan: Vancomycin 1.5gm iv x1, then Vancomycin 750 mg IV Q 24 hrs. Goal AUC 400-550. Expected AUC: 493 SCr used: 1.48  Nomogram vancomycin 750mg  iv q24hr Vancomycin trough goal 15-20 70-80 Kg; 20-30 mL/min  Zosyn 3.375gm iv x1, then Zosyn 3.375g IV Q8H infused over 4hrs.    Height: 5\' 6"  (167.6 cm) Weight: 78.8 kg (173 lb 11.6 oz) IBW/kg (Calculated) : 63.8  Temp (24hrs), Avg:98.5 F (36.9 C), Min:98.1 F (36.7 C), Max:99.3 F (37.4 C)  Recent Labs  Lab 09/22/19 1233 09/23/19 0447 09/24/19 0230 09/25/19 0336 09/26/19 0305 09/27/19 0312 09/27/19 2356 09/28/19 0244  WBC 16.8*  --  7.8  --   --   --  17.7*  --   CREATININE 1.85*   < > 1.55* 1.39* 1.37* 1.22 1.48*  --   LATICACIDVEN  --   --   --   --   --   --   --  2.4*   < > = values in this interval not displayed.    Estimated Creatinine Clearance: 29.5 mL/min (A) (by C-G formula based on SCr of 1.48 mg/dL (H)).    Allergies  Allergen Reactions  . Rocephin [Ceftriaxone Sodium In Dextrose] Nausea And Vomiting and Other (See Comments)     Hallucinations  Tolerated Keflex course 09/2019  . Barbital Swelling, Rash and Other (See Comments)    Blue lips and swollen tongue    Antimicrobials this admission: Vancomycin 09/28/2019 >> Zosyn 09/28/2019 >>   Dose adjustments this admission: -  Microbiology results: -  Thank you for allowing pharmacy to be a part of this patient's care.  Nani Skillern Crowford 09/28/2019 6:19 AM

## 2019-09-28 NOTE — Progress Notes (Signed)
PROGRESS NOTE    Travis Kelly  LDJ:[id number] DOB: [birth year] DOA: 09/27/2019 PCP: [name], MD    Brief Narrative:  Patient was admitted to the hospital with a working diagnosis of urinary retention, complicated with acute decompensation of heart failure. Sepsis has been ruled out.  86 year old male who presented to the hospital with difficulty self-catheterization.  He does have significant past medical history for heart failure, paroxysmal atrial fibrillation, paroxysmal supraventricular tachycardia, dyslipidemia, chronic kidney disease stage III and chronic neurogenic bladder.  Recent hospitalization for decompensated heart failure, discharge 6/18.  At home patient had difficulty to self catheterize.  On his initial physical examination blood pressure 106/73, heart rate 112, respiratory rate 25, oxygen saturation 92%, his lungs had decreased breath sounds at bases, heart S1-S2 present and irregularly irregular, abdomen was soft and nontender, positive bilateral lower extremity edema ++/+++ pitting.  Sodium 136, potassium 3.6, chloride 97, bicarb 29, glucose 115, BUN 24, creatinine 1.22, white count 17.7, hemoglobin 11.9, hematocrit 36.0, platelets 260.  SARS COVID-19 negative.  Urinalysis specific gravity 1.014, red cells 11-20, white cells 0-5. CT renal study with moderate symmetric bilateral perinephric edema, nonobstructing stone 7 mm right lower kidney, positive stones in the urinary bladder.  Chest radiograph with no infiltrates. EKG 114 bpm, left axis deviation, no QT prolongation, atrial fibrillation, positive PVC, no significant ST segment or T wave changes.  Assessment & Plan:   Principal Problem:   Acute urinary retention Active Problems:   Neurogenic bladder   Atrial fibrillation with RVR (HCC)   CHF (congestive heart failure) (HCC)   Pressure injury of skin   1. Acute on chronic urinary retention complicated with AKI on CKD 3b. Patient now has a foley catheter in  place with good toleration. Continue to have elevated serum cr at 1,48 with K at 4,2 and serum bicarbonate at 28.   Will monitor urine output (strict in and out), check renal panel this am, avoid nephrotoxic medications. Likely patient will be discharged from the hospital with foley catheter.   2. Acute on chronic systolic heart failure (biventricular) decompensation LV EF 35 to 40% with global hypokinesis, reduced RV systolic function with pulmonary HTN)/ mitral regurgitation.  Patient with signs of volume overload with bilateral lower extremity edema and positive JVD.   Will continue diuresis with furosemide 40 mg po, his blood pressure has been low down to 79/63 mmHg. If worsening hypotension patient will need inotropic support. Will continue close follow up.   Patient had bolus IV saline this am (250 ml).   3. Chronic atrial fibrillation with rapid ventricular response. Will resume rate control with amiodarone and metoprolol, continue anticoagulation with apixaban for now.   4. Stage 1 sacrum ulcer. Present on admission, continue local wound care/  Patient continue to be at high risk for worsening hemodynamic decompensation.   Status is: Inpatient  Remains inpatient appropriate because:Inpatient level of care appropriate due to severity of illness   Dispo: The patient is from: Home              Anticipated d/c is to: Home              Anticipated d/c date is: 2 days              Patient currently is not medically stable to d/c.   DVT prophylaxis: apixaban  Code Status:   DNR   Family Communication:  I spoke with patient's daughter at the bedside (son in law over the phone),  we talked in detail about patient's condition, plan of care and prognosis and all questions were addressed.     Skin Documentation: Pressure Injury 09/28/19 Sacrum Medial Stage 1 -  Intact skin with non-blanchable redness of a localized area usually over a bony prominence. (Active)  09/28/19 0630    Location: Sacrum  Location Orientation: Medial  Staging: Stage 1 -  Intact skin with non-blanchable redness of a localized area usually over a bony prominence.  Wound Description (Comments):   Present on Admission: Yes     Subjective: Patient very fatigued and deconditioned, denies any pain or dyspnea. Positive somnolence.   Objective: Vitals:   09/28/19 0420 09/28/19 0500 09/28/19 0623 09/28/19 0800  BP: 106/73 104/76    Pulse: (!) 106 96    Resp: (!) 22 (!) 24    Temp:   (!) 97 F (36.1 C) (!) 97 F (36.1 C)  TempSrc:   Axillary Axillary  SpO2: 94% 94%    Weight:      Height:        Intake/Output Summary (Last 24 hours) at 09/28/2019 0857 Last data filed at 09/28/2019 0208 Gross per 24 hour  Intake 1000 ml  Output --  Net 1000 ml   Filed Weights   09/27/19 2349  Weight: 78.8 kg    Examination:   General: deconditioned and ill looking appearing  Neurology: somnolent, response to voice and light touch.  E ENT: positive  pallor, no icterus, oral mucosa moist Cardiovascular: positive JVD. S1-S2 present, rhythmic, no gallops, rubs, or murmurs. ++/ pitting bilateral lower extremity edema. Pulmonary: decerased breath sounds bilaterally at bases, a, no wheezing, rhonchi or rales. Gastrointestinal. Abdomen with no organomegaly, non tender, no rebound or guarding Skin. No rashes Musculoskeletal: no joint deformities     Data Reviewed: I have personally reviewed following labs and imaging studies  CBC: Recent Labs  Lab 09/22/19 1233 09/24/19 0230 09/27/19 2356 09/28/19 0729  WBC 16.8* 7.8 17.7* 15.5*  NEUTROABS 14.8*  --   --   --   HGB 12.8* 11.4* 11.9* 11.9*  HCT 40.0 34.7* 36.0* 35.9*  MCV 105.5* 102.7* 101.7* 102.9*  PLT 171 200 260 035   Basic Metabolic Panel: Recent Labs  Lab 09/24/19 0230 09/25/19 0336 09/26/19 0305 09/27/19 0312 09/27/19 2356 09/28/19 0729  NA 139 141 140 136 134*  --   K 3.8 4.5 3.6 3.6 4.2  --   CL 102 102 101 97* 97*   --   CO2 27 29 27 29 28   --   GLUCOSE 104* 108* 119* 115* 117*  --   BUN 38* 32* 28* 24* 30*  --   CREATININE 1.55* 1.39* 1.37* 1.22 1.48*  --   CALCIUM 8.5* 9.2 8.7* 8.6* 8.2*  --   MG  --   --   --   --   --  2.0   GFR: Estimated Creatinine Clearance: 29.5 mL/min (A) (by C-G formula based on SCr of 1.48 mg/dL (H)). Liver Function Tests: No results for input(s): AST, ALT, ALKPHOS, BILITOT, PROT, ALBUMIN in the last 168 hours. No results for input(s): LIPASE, AMYLASE in the last 168 hours. No results for input(s): AMMONIA in the last 168 hours. Coagulation Profile: No results for input(s): INR, PROTIME in the last 168 hours. Cardiac Enzymes: No results for input(s): CKTOTAL, CKMB, CKMBINDEX, TROPONINI in the last 168 hours. BNP (last 3 results) Recent Labs    10/03/18 1459  PROBNP 4,125*   HbA1C: No results for  input(s): HGBA1C in the last 72 hours. CBG: No results for input(s): GLUCAP in the last 168 hours. Lipid Profile: No results for input(s): CHOL, HDL, LDLCALC, TRIG, CHOLHDL, LDLDIRECT in the last 72 hours. Thyroid Function Tests: No results for input(s): TSH, T4TOTAL, FREET4, T3FREE, THYROIDAB in the last 72 hours. Anemia Panel: No results for input(s): VITAMINB12, FOLATE, FERRITIN, TIBC, IRON, RETICCTPCT in the last 72 hours.    Radiology Studies: I have reviewed all of the imaging during this hospital visit personally     Scheduled Meds: . Chlorhexidine Gluconate Cloth  6 each Topical Q0600  . mouth rinse  15 mL Mouth Rinse BID   Continuous Infusions: . vancomycin       LOS: 0 days        Derren Suydam Gerome Apley, MD

## 2019-09-28 NOTE — Progress Notes (Signed)
MD made aware of BP (88/63, MAP 68)

## 2019-09-29 LAB — CBC WITH DIFFERENTIAL/PLATELET
Abs Immature Granulocytes: 0.1 10*3/uL — ABNORMAL HIGH (ref 0.00–0.07)
Basophils Absolute: 0 10*3/uL (ref 0.0–0.1)
Basophils Relative: 0 %
Eosinophils Absolute: 0 10*3/uL (ref 0.0–0.5)
Eosinophils Relative: 0 %
HCT: 36 % — ABNORMAL LOW (ref 39.0–52.0)
Hemoglobin: 12 g/dL — ABNORMAL LOW (ref 13.0–17.0)
Immature Granulocytes: 1 %
Lymphocytes Relative: 8 %
Lymphs Abs: 1.1 10*3/uL (ref 0.7–4.0)
MCH: 33.6 pg (ref 26.0–34.0)
MCHC: 33.3 g/dL (ref 30.0–36.0)
MCV: 100.8 fL — ABNORMAL HIGH (ref 80.0–100.0)
Monocytes Absolute: 0.8 10*3/uL (ref 0.1–1.0)
Monocytes Relative: 7 %
Neutro Abs: 10.5 10*3/uL — ABNORMAL HIGH (ref 1.7–7.7)
Neutrophils Relative %: 84 %
Platelets: 254 10*3/uL (ref 150–400)
RBC: 3.57 MIL/uL — ABNORMAL LOW (ref 4.22–5.81)
RDW: 13.7 % (ref 11.5–15.5)
WBC: 12.5 10*3/uL — ABNORMAL HIGH (ref 4.0–10.5)
nRBC: 0 % (ref 0.0–0.2)

## 2019-09-29 LAB — BASIC METABOLIC PANEL
Anion gap: 15 (ref 5–15)
BUN: 31 mg/dL — ABNORMAL HIGH (ref 8–23)
CO2: 24 mmol/L (ref 22–32)
Calcium: 8 mg/dL — ABNORMAL LOW (ref 8.9–10.3)
Chloride: 95 mmol/L — ABNORMAL LOW (ref 98–111)
Creatinine, Ser: 1.24 mg/dL (ref 0.61–1.24)
GFR calc Af Amer: 57 mL/min — ABNORMAL LOW (ref >60)
GFR calc non Af Amer: 49 mL/min — ABNORMAL LOW (ref >60)
Glucose, Bld: 95 mg/dL (ref 70–99)
Potassium: 4.2 mmol/L (ref 3.5–5.1)
Sodium: 134 mmol/L — ABNORMAL LOW (ref 135–145)

## 2019-09-29 LAB — URINE CULTURE: Culture: NO GROWTH

## 2019-09-29 MED ORDER — FUROSEMIDE 40 MG PO TABS
60.0000 mg | ORAL_TABLET | Freq: Two times a day (BID) | ORAL | Status: DC
  Administered 2019-09-29 – 2019-10-01 (×5): 60 mg via ORAL
  Filled 2019-09-29 (×6): qty 1

## 2019-09-29 MED ORDER — LIP MEDEX EX OINT
TOPICAL_OINTMENT | CUTANEOUS | Status: DC | PRN
  Filled 2019-09-29: qty 7

## 2019-09-29 NOTE — Progress Notes (Signed)
PROGRESS NOTE    Travis Kelly  ION:[id number] DOB: 04-17-1922 DOA: 09/27/2019 PCP: [name], MD    Brief Narrative:  Patient was admitted to the hospital with a working diagnosis of urinary retention, complicated with acute decompensation of heart failure. Sepsis has been ruled out.  86 year old male who presented to the hospital with difficulty self-catheterization.  He does have significant past medical history for heart failure, paroxysmal atrial fibrillation, paroxysmal supraventricular tachycardia, dyslipidemia, chronic kidney disease stage III and chronic neurogenic bladder.  Recent hospitalization for decompensated heart failure, discharge 6/18.  At home patient had difficulty to self catheterize.  On his initial physical examination blood pressure 106/73, heart rate 112, respiratory rate 25, oxygen saturation 92%, his lungs had decreased breath sounds at bases, heart S1-S2 present and irregularly irregular, abdomen was soft and nontender, positive bilateral lower extremity edema ++/+++ pitting.  Sodium 136, potassium 3.6, chloride 97, bicarb 29, glucose 115, BUN 24, creatinine 1.22, white count 17.7, hemoglobin 11.9, hematocrit 36.0, platelets 260.  SARS COVID-19 negative.  Urinalysis specific gravity 1.014, red cells 11-20, white cells 0-5. CT renal study with moderate symmetric bilateral perinephric edema, nonobstructing stone 7 mm right lower kidney, positive stones in the urinary bladder.  Chest radiograph with no infiltrates. EKG 114 bpm, left axis deviation, no QT prolongation, atrial fibrillation, positive PVC, no significant ST segment or T wave changes.  Patient had foley catheter placed and his cardiac medications resumed. His blood pressure has been low consistent with his advance heart failure. Placed on oral furosemide for volume overload.    Assessment & Plan:   Principal Problem:   Acute urinary retention Active Problems:   Neurogenic bladder   Atrial  fibrillation with RVR (HCC)   CHF (congestive heart failure) (HCC)   Pressure injury of skin    1. Acute on chronic urinary retention complicated with AKI on CKD 3b. Urine output over last 24 H is 1,775 ml, serum cr is 1,24 with K at 4,2 and serum bicarbonate at 24.   Patient continue to be volume overloaded, will increase furosemide to 60 mg po bid to target a more negative fluid balance. Close follow up on blood pressure, keep a MAP 60 or greater.    Plan to discharge patient with foley catheter, he is very debilitated to continue self catheterization at home.   Continue with finasteride.   2. Acute on chronic systolic heart failure (biventricular) decompensation LV EF 35 to 40% with global hypokinesis, reduced RV systolic function with pulmonary HTN)/ mitral regurgitation.  Persistent signs of volume overload with bilateral lower extremity edema and positive JVD. Patient with biventricular failure and mitral regurgitation, poor prognosis.   Will target a d blood pressure MAP 60 or greater. Continue rate control atrial fibrillation and diuresis as tolerated. If worsening hypotension patient may need inotropic support.   3. Chronic atrial fibrillation with rapid ventricular response. Continue with miodarone and metoprolol for rate control, anticoagulation with apixaban.  HR 108 to 126.   4. Stage 1 sacrum ulcer. Present on admission, Local wound care/   Patient continue to be at high risk for worsening hemodynamic decompensation.   Status is: Inpatient  Remains inpatient appropriate because:Inpatient level of care appropriate due to severity of illness. Decompnesated heart failure with high risk for deterioration.    Dispo: The patient is from: Home              Anticipated d/c is to: Home  Anticipated d/c date is: 3 days              Patient currently is not medically stable to d/c.   DVT prophylaxis: apixaban   Code Status:    dnr   Family Communication:    No family at the bedside this am.       Skin Documentation: Pressure Injury 09/28/19 Sacrum Medial Stage 1 -  Intact skin with non-blanchable redness of a localized area usually over a bony prominence. (Active)  09/28/19 0630  Location: Sacrum  Location Orientation: Medial  Staging: Stage 1 -  Intact skin with non-blanchable redness of a localized area usually over a bony prominence.  Wound Description (Comments):   Present on Admission: Yes      Subjective: Patient very weak and deconditioned, dyspnea worse with exertion, continue to have lower extremity edema. Very limited mobility. Mild discomfort at the site of foley.   Objective: Vitals:   09/29/19 0300 09/29/19 0400 09/29/19 0500 09/29/19 0600  BP: (!) 89/59 106/62 107/74 123/73  Pulse: (!) 52 (!) 126 (!) 118 (!) 46  Resp: (!) 23 (!) 27 (!) 26 (!) 22  Temp:  97.7 F (36.5 C)    TempSrc:  Axillary    SpO2: 96% 97% 100% 95%  Weight:      Height:        Intake/Output Summary (Last 24 hours) at 09/29/2019 0809 Last data filed at 09/29/2019 9562 Gross per 24 hour  Intake 165.7 ml  Output 1775 ml  Net -1609.3 ml   Filed Weights   09/27/19 2349  Weight: 78.8 kg    Examination:   General: Not in pain or dyspnea, deconditioned  Neurology: Awake and alert, non focal  E ENT: mild pallor, no icterus, oral mucosa moist Cardiovascular: No JVD. S1-S2 present, rhythmic, no gallops, rubs, or murmurs. +++ pitting lower extremity edema. Pulmonary: positive breath sounds bilaterally, decreased air movement at bases, no wheezing, rhonchi or rales. Gastrointestinal. Abdomen with, no organomegaly, non tender, no rebound or guarding Skin. No rashes Musculoskeletal: no joint deformities     Data Reviewed: I have personally reviewed following labs and imaging studies  CBC: Recent Labs  Lab 09/22/19 1233 09/24/19 0230 09/27/19 2356 09/28/19 0729 09/29/19 0217  WBC 16.8* 7.8 17.7* 15.5* 12.5*  NEUTROABS 14.8*  --   --    --  10.5*  HGB 12.8* 11.4* 11.9* 11.9* 12.0*  HCT 40.0 34.7* 36.0* 35.9* 36.0*  MCV 105.5* 102.7* 101.7* 102.9* 100.8*  PLT 171 200 260 240 130   Basic Metabolic Panel: Recent Labs  Lab 09/26/19 0305 09/27/19 0312 09/27/19 2356 09/28/19 0729 09/28/19 1000 09/29/19 0217  NA 140 136 134*  --  137 134*  K 3.6 3.6 4.2  --  4.7 4.2  CL 101 97* 97*  --  100 95*  CO2 27 29 28   --  19* 24  GLUCOSE 119* 115* 117*  --  91 95  BUN 28* 24* 30*  --  32* 31*  CREATININE 1.37* 1.22 1.48*  --  1.33* 1.24  CALCIUM 8.7* 8.6* 8.2*  --  8.4* 8.0*  MG  --   --   --  2.0  --   --    GFR: Estimated Creatinine Clearance: 35.2 mL/min (by C-G formula based on SCr of 1.24 mg/dL). Liver Function Tests: No results for input(s): AST, ALT, ALKPHOS, BILITOT, PROT, ALBUMIN in the last 168 hours. No results for input(s): LIPASE, AMYLASE in the last 168 hours.  No results for input(s): AMMONIA in the last 168 hours. Coagulation Profile: No results for input(s): INR, PROTIME in the last 168 hours. Cardiac Enzymes: No results for input(s): CKTOTAL, CKMB, CKMBINDEX, TROPONINI in the last 168 hours. BNP (last 3 results) Recent Labs    10/03/18 1459  PROBNP 4,125*   HbA1C: No results for input(s): HGBA1C in the last 72 hours. CBG: No results for input(s): GLUCAP in the last 168 hours. Lipid Profile: No results for input(s): CHOL, HDL, LDLCALC, TRIG, CHOLHDL, LDLDIRECT in the last 72 hours. Thyroid Function Tests: No results for input(s): TSH, T4TOTAL, FREET4, T3FREE, THYROIDAB in the last 72 hours. Anemia Panel: No results for input(s): VITAMINB12, FOLATE, FERRITIN, TIBC, IRON, RETICCTPCT in the last 72 hours.    Radiology Studies: I have reviewed all of the imaging during this hospital visit personally     Scheduled Meds: . amiodarone  200 mg Oral Daily  . apixaban  5 mg Oral BID  . Chlorhexidine Gluconate Cloth  6 each Topical Q0600  . finasteride  5 mg Oral Daily  . furosemide  40 mg Oral  Daily  . mouth rinse  15 mL Mouth Rinse BID  . metoprolol tartrate  50 mg Oral BID  . senna  1 tablet Oral QPM   Continuous Infusions:   LOS: 1 day        Citlalic Norlander Gerome Apley, MD

## 2019-09-30 ENCOUNTER — Inpatient Hospital Stay (HOSPITAL_COMMUNITY): Admit: 2019-09-30 | Payer: Medicare HMO | Admitting: Nurse Practitioner

## 2019-09-30 LAB — CBC WITH DIFFERENTIAL/PLATELET
Abs Immature Granulocytes: 0.06 10*3/uL (ref 0.00–0.07)
Basophils Absolute: 0 10*3/uL (ref 0.0–0.1)
Basophils Relative: 0 %
Eosinophils Absolute: 0 10*3/uL (ref 0.0–0.5)
Eosinophils Relative: 0 %
HCT: 36 % — ABNORMAL LOW (ref 39.0–52.0)
Hemoglobin: 11.9 g/dL — ABNORMAL LOW (ref 13.0–17.0)
Immature Granulocytes: 1 %
Lymphocytes Relative: 9 %
Lymphs Abs: 1 10*3/uL (ref 0.7–4.0)
MCH: 33 pg (ref 26.0–34.0)
MCHC: 33.1 g/dL (ref 30.0–36.0)
MCV: 99.7 fL (ref 80.0–100.0)
Monocytes Absolute: 0.6 10*3/uL (ref 0.1–1.0)
Monocytes Relative: 6 %
Neutro Abs: 8.6 10*3/uL — ABNORMAL HIGH (ref 1.7–7.7)
Neutrophils Relative %: 84 %
Platelets: 314 10*3/uL (ref 150–400)
RBC: 3.61 MIL/uL — ABNORMAL LOW (ref 4.22–5.81)
RDW: 13.6 % (ref 11.5–15.5)
WBC: 10.2 10*3/uL (ref 4.0–10.5)
nRBC: 0 % (ref 0.0–0.2)

## 2019-09-30 LAB — BASIC METABOLIC PANEL
Anion gap: 10 (ref 5–15)
BUN: 29 mg/dL — ABNORMAL HIGH (ref 8–23)
CO2: 31 mmol/L (ref 22–32)
Calcium: 8 mg/dL — ABNORMAL LOW (ref 8.9–10.3)
Chloride: 97 mmol/L — ABNORMAL LOW (ref 98–111)
Creatinine, Ser: 1.21 mg/dL (ref 0.61–1.24)
GFR calc Af Amer: 59 mL/min — ABNORMAL LOW (ref >60)
GFR calc non Af Amer: 51 mL/min — ABNORMAL LOW (ref >60)
Glucose, Bld: 106 mg/dL — ABNORMAL HIGH (ref 70–99)
Potassium: 3.1 mmol/L — ABNORMAL LOW (ref 3.5–5.1)
Sodium: 138 mmol/L (ref 135–145)

## 2019-09-30 MED ORDER — POTASSIUM CHLORIDE CRYS ER 20 MEQ PO TBCR
40.0000 meq | EXTENDED_RELEASE_TABLET | ORAL | Status: AC
  Administered 2019-09-30 (×2): 40 meq via ORAL
  Filled 2019-09-30 (×2): qty 2

## 2019-09-30 NOTE — Evaluation (Signed)
Occupational Therapy Evaluation Patient Details Name: Travis Kelly MRN: [id number] DOB: December 22, 1922 Today's Date: 09/30/2019    History of Present Illness 86 yo male readmitted one day after being discharged from hospitalization with volume overload, with inability to catheterize and generalized weakness. PMHx:  L BBB, PSVT, CHF, OA knees, CKD3, mitral regurg, macular degeneration, Atherosclerosis, cardiomegaly, neurogenic bladder   Clinical Impression   Pt ambulated short distances with a RW and used a scooter longer distances in his ILF. He was assisted for meals and ADL each morning and was participating in facility based therapy. Pt presents with generalized weakness, decreased standing balance with initial posterior lean and decreased activity tolerance. Recommending ST rehab prior to return home. Family is considering rehab vs hiring more staff for round the clock care. Will follow acutely.    Follow Up Recommendations  SNF;Supervision/Assistance - 24 hour    Equipment Recommendations  None recommended by OT    Recommendations for Other Services       Precautions / Restrictions Precautions Precautions: Fall Restrictions Weight Bearing Restrictions: No      Mobility Bed Mobility Overal bed mobility: Needs Assistance Bed Mobility: Supine to Sit     Supine to sit: +2 for physical assistance;Mod assist     General bed mobility comments: pt able to advance LEs to EOB, assisted for hips using bed pad and to raise trunk  Transfers Overall transfer level: Needs assistance Equipment used: Rolling walker (2 wheeled) Transfers: Sit to/from [employer] Transfers Sit to Stand: +2 physical assistance;Min assist Stand pivot transfers: +2 physical assistance;Min assist       General transfer comment: cues for LE positioning and to move out to EOB prior to standing, assist to rise and steady, pt with posterior lean initially    Balance Overall balance assessment: Needs  assistance   Sitting balance-Leahy Scale: Fair   Postural control: Posterior lean Standing balance support: Bilateral upper extremity supported;During functional activity Standing balance-Leahy Scale: Poor Standing balance comment: reliant B UE and on external support; posterior leaning requiring mod assist to correct                           ADL either performed or assessed with clinical judgement   ADL Overall ADL's : Needs assistance/impaired Eating/Feeding: Minimal assistance;Sitting Eating/Feeding Details (indicate cue type and reason): difficulty using R hand to self feed Grooming: Minimal assistance;Sitting   Upper Body Bathing: Moderate assistance;Sitting   Lower Body Bathing: Total assistance;Sit to/from stand   Upper Body Dressing : Moderate assistance;Sitting   Lower Body Dressing: Total assistance;Sit to/from stand   Toilet Transfer: +2 for physical assistance;Minimal assistance;Stand-pivot;BSC;RW   Toileting- Clothing Manipulation and Hygiene: Total assistance;Sit to/from stand         General ADL Comments: Spoke to pt and daughter about option of rehab in SNF prior to returning home.     Vision Baseline Vision/History: Macular degeneration Patient Visual Report: No change from baseline       Perception     Praxis      Pertinent Vitals/Pain Pain Assessment: Faces Faces Pain Scale: Hurts little more Pain Location: R hand Pain Descriptors / Indicators: Sore;Aching Pain Intervention(s): Monitored during session;Repositioned     Hand Dominance Right   Extremity/Trunk Assessment Upper Extremity Assessment Upper Extremity Assessment: Generalized weakness;RUE deficits/detail RUE Deficits / Details: edematous, painful, limited use--elevated hand on pillow/towel RUE Coordination: decreased fine motor   Lower Extremity Assessment Lower Extremity Assessment: Defer to  PT evaluation   Cervical / Trunk Assessment Cervical / Trunk Assessment:  Kyphotic (with forward head)   Communication Communication Communication: HOH   Cognition Arousal/Alertness: Awake/alert Behavior During Therapy: WFL for tasks assessed/performed Overall Cognitive Status: Within Functional Limits for tasks assessed                                     General Comments       Exercises     Shoulder Instructions      Home Living Family/patient expects to be discharged to:: Private residence Living Arrangements: Alone Available Help at Discharge: Personal care attendant;Family;Available PRN/intermittently Type of Home: Independent living facility ([facility]) Home Access: Elevator     Home Layout: One level     Bathroom Shower/Tub: Occupational psychologist: Standard     Home Equipment: Environmental consultant - 2 wheels;Shower seat;Electric scooter;Grab bars - toilet;Grab bars - tub/shower;Hand held shower head;Hospital bed;Other (comment) (lift chair)   Additional Comments: typically is assisted for breakfast and ADL by a caregiver each morning, pt walks with RW in his apartment and uses a scooter in the facility, he has home OT and PT      Prior Functioning/Environment Level of Independence: Needs assistance  Gait / Transfers Assistance Needed: RW for room and scooter for hallways ADL's / Homemaking Assistance Needed: between facility staff and [facility] benefits patient has assist with dressing/bathing in the morning, DTR states can look into getting more help from [place] as needed.    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        OT Problem List: Decreased activity tolerance;Impaired balance (sitting and/or standing);Decreased strength;Decreased safety awareness;Decreased coordination;Decreased knowledge of use of DME or AE;Pain;Impaired UE functional use;Cardiopulmonary status limiting activity      OT Treatment/Interventions: Self-care/ADL training;Therapeutic exercise;Energy conservation;DME and/or AE  instruction;Therapeutic activities;Patient/family education;Balance training    OT Goals(Current goals can be found in the care plan section) Acute Rehab OT Goals Patient Stated Goal: to regain strength and return to ILF OT Goal Formulation: With patient/family Time For Goal Achievement: 10/14/19 Potential to Achieve Goals: Good ADL Goals Pt Will Perform Eating: Independently;sitting Pt Will Perform Grooming: with min guard assist;standing (2 activities) Pt Will Transfer to Toilet: with min assist;ambulating;bedside commode Pt Will Perform Toileting - Clothing Manipulation and hygiene: with min assist;sit to/from stand Pt/caregiver will Perform Home Exercise Program: Increased strength;Increased ROM;Independently (R hand) Additional ADL Goal #1: Pt will perform bed mobility with supervision in preparation for ADL.  OT Frequency: Min 2X/week   Barriers to D/C:            Co-evaluation PT/OT/SLP Co-Evaluation/Treatment: Yes Reason for Co-Treatment: For patient/therapist safety PT goals addressed during session: Mobility/safety with mobility OT goals addressed during session: ADL's and self-care      AM-PAC OT "6 Clicks" Daily Activity     Outcome Measure Help from another person eating meals?: A Little Help from another person taking care of personal grooming?: A Little Help from another person toileting, which includes using toliet, bedpan, or urinal?: Total Help from another person bathing (including washing, rinsing, drying)?: A Lot Help from another person to put on and taking off regular upper body clothing?: A Lot Help from another person to put on and taking off regular lower body clothing?: Total 6 Click Score: 12   End of Session Equipment Utilized During Treatment: Rolling walker;Gait belt  Activity Tolerance: Patient  tolerated treatment well Patient left: in chair;with call bell/phone within reach;with chair alarm set;with family/visitor present  OT Visit  Diagnosis: Unsteadiness on feet (R26.81);Other abnormalities of gait and mobility (R26.89);Muscle weakness (generalized) (M62.81);Pain Pain - Right/Left: Right Pain - part of body: Hand                Time: 4709-6283 OT Time Calculation (min): 20 min Charges:  OT General Charges $OT Visit: 1 Visit OT Evaluation $OT Eval Moderate Complexity: 1 Mod  Nestor Lewandowsky, OTR/L Acute Rehabilitation Services Pager: [phone] Office: [phone]  Malka So 09/30/2019, 3:16 PM

## 2019-09-30 NOTE — Progress Notes (Signed)
Peri from [place] calling in regards to discharge planning, [phone]. Patient in agreement to discuss plan of care [facility] representative.

## 2019-09-30 NOTE — Evaluation (Signed)
Physical Therapy Evaluation Patient Details Name: Travis Kelly MRN: [id number] DOB: 11/30/1922 Today's Date: 09/30/2019   History of Present Illness  86 yo male readmitted one day after being discharged from hospitalization with volume overload, with inability to catheterize and generalized weakness. PMHx:  L BBB, PSVT, CHF, OA knees, CKD3, mitral regurg, macular degeneration, Atherosclerosis, cardiomegaly, neurogenic bladder  Clinical Impression  On eval, pt required Mod assist +2 for mobility. He was able to stand and pivot to recliner. Pt presents with general weakness, decreased activity tolerance, and impaired gait and balance. Family was present during session. They are undecided about d/c plan currently. Should pt return home, he would need 24/7 supervision/assist. Will continue to follow and progress activity as tolerated.     Follow Up Recommendations SNF (HHPT and 24 hour supervision/assist if family chooses to take him home)    Equipment Recommendations       Recommendations for Other Services       Precautions / Restrictions Precautions Precautions: Fall Restrictions Weight Bearing Restrictions: No      Mobility  Bed Mobility Overal bed mobility: Needs Assistance Bed Mobility: Supine to Sit     Supine to sit: +2 for physical assistance;Mod assist     General bed mobility comments: pt able to advance LEs to EOB, assisted for hips using bed pad and to raise trunk  Transfers Overall transfer level: Needs assistance Equipment used: Rolling walker (2 wheeled) Transfers: Sit to/from [employer] Transfers Sit to Stand: +2 physical assistance;Min assist Stand pivot transfers: +2 physical assistance;Min assist       General transfer comment: cues for LE positioning and to move out to EOB prior to standing, assist to rise and steady, pt with posterior lean initially  Ambulation/Gait                Stairs            Wheelchair Mobility     Modified Rankin (Stroke Patients Only)       Balance Overall balance assessment: Needs assistance   Sitting balance-Leahy Scale: Fair   Postural control: Posterior lean Standing balance support: Bilateral upper extremity supported Standing balance-Leahy Scale: Poor Standing balance comment: reliant B UE and on external support; posterior leaning requiring mod assist to correct                             Pertinent Vitals/Pain Pain Assessment: Faces Faces Pain Scale: Hurts little more Pain Location: R hand Pain Descriptors / Indicators: Sore;Aching Pain Intervention(s): Monitored during session;Limited activity within patient's tolerance;Repositioned    Home Living Family/patient expects to be discharged to:: Private residence Living Arrangements: Alone Available Help at Discharge: Personal care attendant;Family;Available PRN/intermittently Type of Home: Independent living facility ([facility]) Home Access: Elevator     Home Layout: One level Home Equipment: Walker - 2 wheels;Shower seat;Electric scooter;Grab bars - toilet;Grab bars - tub/shower;Hand held shower head;Hospital bed;Other (comment) (lift chair) Additional Comments: typically is assisted for breakfast and ADL by a caregiver each morning, pt walks with RW in his apartment and uses a scooter in the facility, he has home OT and PT    Prior Function Level of Independence: Needs assistance   Gait / Transfers Assistance Needed: RW for room and scooter for hallways  ADL's / Homemaking Assistance Needed: between facility staff and VA benefits patient has assist with dressing/bathing in the morning, DTR states can look into getting more help from [place] as  needed.   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, newspaper   Dominant Hand: Right    Extremity/Trunk Assessment   Upper Extremity Assessment Upper Extremity Assessment: Defer to OT evaluation RUE Deficits / Details:  edematous, painful, limited use--elevated hand on pillow/towel RUE Coordination: decreased fine motor    Lower Extremity Assessment Lower Extremity Assessment: Generalized weakness    Cervical / Trunk Assessment Cervical / Trunk Assessment: Kyphotic  Communication   Communication: HOH  Cognition Arousal/Alertness: Awake/alert Behavior During Therapy: WFL for tasks assessed/performed Overall Cognitive Status: Within Functional Limits for tasks assessed                                        General Comments      Exercises     Assessment/Plan    PT Assessment Patient needs continued PT services  PT Problem List Decreased strength;Decreased mobility;Decreased balance;Decreased activity tolerance;Decreased knowledge of use of DME       PT Treatment Interventions DME instruction;Gait training;Therapeutic activities;Therapeutic exercise;Patient/family education;Balance training;Functional mobility training    PT Goals (Current goals can be found in the Care Plan section)  Acute Rehab PT Goals Patient Stated Goal: to regain strength and return to ILF PT Goal Formulation: With patient/family Time For Goal Achievement: 10/14/19 Potential to Achieve Goals: Good    Frequency Min 3X/week   Barriers to discharge        Co-evaluation PT/OT/SLP Co-Evaluation/Treatment: Yes Reason for Co-Treatment: For patient/therapist safety PT goals addressed during session: Mobility/safety with mobility OT goals addressed during session: ADL's and self-care       AM-PAC PT "6 Clicks" Mobility  Outcome Measure Help needed turning from your back to your side while in a flat bed without using bedrails?: A Lot Help needed moving from lying on your back to sitting on the side of a flat bed without using bedrails?: A Lot Help needed moving to and from a bed to a chair (including a wheelchair)?: A Lot Help needed standing up from a chair using your arms (e.g., wheelchair or  bedside chair)?: A Lot Help needed to walk in hospital room?: A Lot Help needed climbing 3-5 steps with a railing? : Total 6 Click Score: 11    End of Session Equipment Utilized During Treatment: Gait belt Activity Tolerance: Patient limited by fatigue Patient left: in chair;with call bell/phone within reach;with family/visitor present   PT Visit Diagnosis: Unsteadiness on feet (R26.81);Muscle weakness (generalized) (M62.81);Difficulty in walking, not elsewhere classified (R26.2)    Time: 3009-2330 PT Time Calculation (min) (ACUTE ONLY): 29 min   Charges:   PT Evaluation $PT Eval Low Complexity: [place], PT Acute Rehabilitation  Office: [phone] Pager: [phone]

## 2019-09-30 NOTE — Progress Notes (Signed)
PROGRESS NOTE    Travis Kelly  FYT:[id number] DOB: [birth year] DOA: 09/27/2019 PCP: [name], MD    Brief Narrative:  Patient was admitted to the hospital with a working diagnosis ofurinary retention, complicated with acute decompensation of heart failure. Sepsis has been ruled out.  86 year old male who presented to the hospital with difficulty self-catheterization. He does have significant past medical history for heart failure, paroxysmal atrial fibrillation, paroxysmal supraventricular tachycardia, dyslipidemia, chronic kidney disease stage III and chronic neurogenic bladder. Recent hospitalization for decompensated heart failure,discharge 6/18. At home patient had difficulty to self catheterize. On his initial physical examination blood pressure 106/73, heart rate 112, respiratory rate 25, oxygen saturation 92%, his lungs had decreased breath sounds at bases, heart S1-S2 present and irregularly irregular, abdomen was soft and nontender, positive bilateral lower extremity edema++/+++ pitting. Sodium 136, potassium 3.6, chloride 97, bicarb 29, glucose 115, BUN 24, creatinine 1.22, white count 17.7, hemoglobin 11.9, hematocrit 36.0, platelets 260. SARS COVID-19 negative. Urinalysis specific gravity 1.014, red cells 11-20, white cells 0-5. CT renal study with moderate symmetric bilateral perinephric edema, nonobstructing stone 7 mm right lower kidney, positive stones in the urinary bladder.Chest radiograph with no infiltrates. EKG 114 bpm, left axis deviation, no QT prolongation, atrial fibrillation, positive PVC, no significant ST segment or T wave changes.  Patient had foley catheter placed and his cardiac medications resumed. His blood pressure has been low consistent with his advance heart failure. Placed on oral furosemide for volume overload.   Responding well to diuresis, but continue to be very weak and deconditioned. Pending physical therapy evaluation.     Assessment & Plan:   Principal Problem:   Acute urinary retention Active Problems:   Neurogenic bladder   Atrial fibrillation with RVR (HCC)   CHF (congestive heart failure) (HCC)   Pressure injury of skin    1. Acute on chronic urinary retention complicated with AKI on CKD 3b/ hypokalemia. Urine output over last 24 H is 3,750 ml, serum cr is 1,21 with K at 3,1 and serum bicarbonate at 29.   Improved volume status, but not yet euvolemic. Will continue diuresis with furosemide 60 mg BID. Continue to target MAP 60 or above. Continue telemetry monitoring and rate control atrial fibrillation. Will replete K with KCl 80 meq in 2 divided doses. Check Mg in am.   Continue foley catheter for now.   On finasteride.   2. Acute on chronic systolic heart failure (biventricular) decompensation LV EF 35 to 40% with global hypokinesis, reduced RV systolic function with pulmonary HTN)/ mitral regurgitation. Patient with biventricular failure and mitral regurgitation, poor prognosis.   Improving volume status, but not yet euvolemic, will continue diuresis with furosemide and rate control atrial fibrillation.  He is very debilitated, will need physical/ occupation therapy evaluation.   3. Chronic atrial fibrillation with rapid ventricular response. Tolerating well miodarone and metoprolol for rate control, continue anticoagulation with apixaban.  HR 104 to 120 bpm.   4. Stage 1 sacrum ulcer. Present on admission, Continue with ocal wound care/ consult nutrition.    Patient continue to be at high risk for worsening hemodynamic decompensation. Will transfer to telemetry unit.   Status is: Inpatient  Remains inpatient appropriate because:Inpatient level of care appropriate due to severity of illness   Dispo: The patient is from: Home              Anticipated d/c is to: SNF  Anticipated d/c date is: 2 days              Patient currently is not medically stable to  d/c.   DVT prophylaxis: apixaban   Code Status:   dnr   Family Communication:  I spoke with patient's daughter (son over the phone) at the bedside, we talked in detail about patient's condition, plan of care and prognosis and all questions were addressed.      Skin Documentation: Pressure Injury 09/28/19 Sacrum Medial Stage 1 -  Intact skin with non-blanchable redness of a localized area usually over a bony prominence. (Active)  09/28/19 0630  Location: Sacrum  Location Orientation: Medial  Staging: Stage 1 -  Intact skin with non-blanchable redness of a localized area usually over a bony prominence.  Wound Description (Comments):   Present on Admission: Yes        Subjective: Patient is feeling better but not yet back to baseline, dyspnea continue to improve along with edema of lower extremities. Patient very weak and deconditioned, limited mobility.   Objective: Vitals:   09/29/19 2000 09/29/19 2345 09/30/19 0000 09/30/19 0400  BP: (!) 110/43  90/65   Pulse:      Resp: (!) 27  (!) 26   Temp: 98.3 F (36.8 C) 98.3 F (36.8 C)  98.1 F (36.7 C)  TempSrc: Oral Oral  Oral  SpO2:      Weight:      Height:        Intake/Output Summary (Last 24 hours) at 09/30/2019 0838 Last data filed at 09/30/2019 0440 Gross per 24 hour  Intake 480 ml  Output 3750 ml  Net -3270 ml   Filed Weights   09/27/19 2349  Weight: 78.8 kg    Examination:   General: deconditioned Neurology: Awake and alert, non focal  E ENT: mild pallor, no icterus, oral mucosa moist Cardiovascular: No JVD. S1-S2 present, rhythmic, no gallops, rubs, or murmurs. +/++ pitting bilateral lower extremity edema. Pulmonary: positive breath sounds bilaterally anterior auscultation with mild decreased breath sounds at the dependent zones.   Gastrointestinal. Abdomen with no organomegaly, non tender, no rebound or guarding Skin. No rashes Musculoskeletal: no joint deformities     Data Reviewed: I have  personally reviewed following labs and imaging studies  CBC: Recent Labs  Lab 09/24/19 0230 09/27/19 2356 09/28/19 0729 09/29/19 0217 09/30/19 0302  WBC 7.8 17.7* 15.5* 12.5* 10.2  NEUTROABS  --   --   --  10.5* 8.6*  HGB 11.4* 11.9* 11.9* 12.0* 11.9*  HCT 34.7* 36.0* 35.9* 36.0* 36.0*  MCV 102.7* 101.7* 102.9* 100.8* 99.7  PLT 200 260 240 254 161   Basic Metabolic Panel: Recent Labs  Lab 09/27/19 0312 09/27/19 2356 09/28/19 0729 09/28/19 1000 09/29/19 0217 09/30/19 0302  NA 136 134*  --  137 134* 138  K 3.6 4.2  --  4.7 4.2 3.1*  CL 97* 97*  --  100 95* 97*  CO2 29 28  --  19* 24 31  GLUCOSE 115* 117*  --  91 95 106*  BUN 24* 30*  --  32* 31* 29*  CREATININE 1.22 1.48*  --  1.33* 1.24 1.21  CALCIUM 8.6* 8.2*  --  8.4* 8.0* 8.0*  MG  --   --  2.0  --   --   --    GFR: Estimated Creatinine Clearance: 36.1 mL/min (by C-G formula based on SCr of 1.21 mg/dL). Liver Function Tests: No results for input(s): AST,  ALT, ALKPHOS, BILITOT, PROT, ALBUMIN in the last 168 hours. No results for input(s): LIPASE, AMYLASE in the last 168 hours. No results for input(s): AMMONIA in the last 168 hours. Coagulation Profile: No results for input(s): INR, PROTIME in the last 168 hours. Cardiac Enzymes: No results for input(s): CKTOTAL, CKMB, CKMBINDEX, TROPONINI in the last 168 hours. BNP (last 3 results) Recent Labs    10/03/18 1459  PROBNP 4,125*   HbA1C: No results for input(s): HGBA1C in the last 72 hours. CBG: No results for input(s): GLUCAP in the last 168 hours. Lipid Profile: No results for input(s): CHOL, HDL, LDLCALC, TRIG, CHOLHDL, LDLDIRECT in the last 72 hours. Thyroid Function Tests: No results for input(s): TSH, T4TOTAL, FREET4, T3FREE, THYROIDAB in the last 72 hours. Anemia Panel: No results for input(s): VITAMINB12, FOLATE, FERRITIN, TIBC, IRON, RETICCTPCT in the last 72 hours.    Radiology Studies: I have reviewed all of the imaging during this hospital  visit personally     Scheduled Meds: . amiodarone  200 mg Oral Daily  . apixaban  5 mg Oral BID  . Chlorhexidine Gluconate Cloth  6 each Topical Q0600  . finasteride  5 mg Oral Daily  . furosemide  60 mg Oral BID  . mouth rinse  15 mL Mouth Rinse BID  . metoprolol tartrate  50 mg Oral BID  . potassium chloride  40 mEq Oral Q4H  . senna  1 tablet Oral QPM   Continuous Infusions:   LOS: 2 days        Zeph Riebel Gerome Apley, MD

## 2019-09-30 NOTE — TOC Initial Note (Addendum)
Transition of Care [facility]) - Initial/Assessment Note    Patient Details  Name: Travis Kelly MRN: [id number] Date of Birth: [birth year]  [facility]) CM/SW Contact:    Leeroy Cha, RN Phone Number: 09/30/2019, 10:23 AM  Clinical Narrative:                 Patient is from [place]. Acute urinary retention and chf. Plan: follow for toc needs may need to go snf post hospital stay for short term rehab, [facility] with financial assistance/first choice is actively working with the patient to apply for medicaid. Expected Discharge Plan: [place] Barriers to Discharge: Continued Medical Work up   Patient Goals and CMS Choice Patient states their goals for this hospitalization and ongoing recovery are:: to return to [employer].gov Compare Post Acute Care list provided to:: Patient Choice offered to / list presented to : Patient  Expected Discharge Plan and Services Expected Discharge Plan: [place]       Living arrangements for the past 2 months: Apartment                                      Prior Living Arrangements/Services Living arrangements for the past 2 months: Apartment Lives with:: Self Patient language and need for interpreter reviewed:: No        Need for Family Participation in Patient Care: Yes (Comment) Care giver support system in place?: Yes (comment)   Criminal Activity/Legal Involvement Pertinent to Current Situation/Hospitalization: No - Comment as needed  Activities of Daily Living      Permission Sought/Granted                  Emotional Assessment Appearance:: Appears stated age     Orientation: : Oriented to Self, Oriented to Place, Oriented to  Time, Oriented to Situation Alcohol / Substance Use: Not Applicable Psych Involvement: No (comment)  Admission diagnosis:  Severe sepsis ([place]) [A41.9, R65.20] Patient Active Problem List   Diagnosis  Date Noted  . Severe sepsis ([place]) 09/28/2019  . Acute urinary retention 09/28/2019  . Neurogenic bladder 09/28/2019  . Atrial fibrillation with RVR ([place]) 09/28/2019  . CHF (congestive heart failure) ([place]) 09/28/2019  . Pressure injury of skin 09/28/2019  . Acute on chronic diastolic CHF (congestive heart failure) ([place]) 09/22/2019  . PAF (paroxysmal atrial fibrillation) ([place]) 09/22/2019    Class: Diagnosis of  . Pseudogout of knee, right 05/03/2018  . Chronic diastolic CHF (congestive heart failure) ([place]) 04/18/2018  . Abnormal liver function 04/17/2018  . BPH (benign prostatic hyperplasia) 04/16/2018  . Chronic renal insufficiency, stage 3 (moderate) 07/10/2017  . Degenerative joint disease of shoulder region 03/09/2017  . LBBB (left bundle branch block) 03/09/2017  . Dyspnea on exertion 02/24/2017  . Moderate mitral regurgitation 11/08/2016  . Elevated troponin 07/05/2016  . Normochromic normocytic anemia 07/05/2016  . History of PSVT  07/04/2016  . Blepharitis of upper and lower eyelids of both eyes 04/07/2015  . Pseudophakia of both eyes 04/07/2015  . PVD (posterior vitreous detachment), left 04/07/2015  . Status post cataract extraction and insertion of intraocular lens 01/25/2012  . Cataract, immature 05/10/2011  . Macular degeneration 05/10/2011  . Shoulder pain, bilateral 05/10/2011  . Calculus of kidney 08/13/2010  . Brachial neuritis or radiculitis 07/19/2010  . Cervical spondylosis without myelopathy 07/19/2010  . Lipoma  04/29/2010  . Dyslipidemia 02/07/2007  . Esophageal reflux 02/07/2007   PCP:  [name], MD Pharmacy:   [id number] - [place], [place] [address] [place] [address] Phone: [phone] Fax: [phone]  [facility] - [address] [place] [address] Phone: [phone] Fax: [phone]  [facility] of [place], [place] - [address] [place] [address] Phone: [phone] Fax: [phone]     Social Determinants of Health (SDOH) Interventions    Readmission Risk Interventions Readmission Risk Prevention Plan 09/27/2019  Post Dischage Appt Complete  Medication Screening Complete  Transportation Screening Complete  Some recent data might be hidden

## 2019-10-01 LAB — BASIC METABOLIC PANEL
Anion gap: 10 (ref 5–15)
BUN: 30 mg/dL — ABNORMAL HIGH (ref 8–23)
CO2: 31 mmol/L (ref 22–32)
Calcium: 7.9 mg/dL — ABNORMAL LOW (ref 8.9–10.3)
Chloride: 94 mmol/L — ABNORMAL LOW (ref 98–111)
Creatinine, Ser: 1.22 mg/dL (ref 0.61–1.24)
GFR calc Af Amer: 58 mL/min — ABNORMAL LOW (ref >60)
GFR calc non Af Amer: 50 mL/min — ABNORMAL LOW (ref >60)
Glucose, Bld: 122 mg/dL — ABNORMAL HIGH (ref 70–99)
Potassium: 3.7 mmol/L (ref 3.5–5.1)
Sodium: 135 mmol/L (ref 135–145)

## 2019-10-01 LAB — MAGNESIUM: Magnesium: 2 mg/dL (ref 1.7–2.4)

## 2019-10-01 MED ORDER — KATE FARMS STANDARD 1.4 PO LIQD
325.0000 mL | ORAL | Status: DC
  Administered 2019-10-01 – 2019-10-05 (×5): 325 mL via ORAL
  Filled 2019-10-01 (×6): qty 325

## 2019-10-01 MED ORDER — METOPROLOL TARTRATE 50 MG PO TABS
75.0000 mg | ORAL_TABLET | Freq: Two times a day (BID) | ORAL | Status: DC
  Administered 2019-10-01 – 2019-10-03 (×4): 75 mg via ORAL
  Filled 2019-10-01: qty 3
  Filled 2019-10-01 (×3): qty 1

## 2019-10-01 MED ORDER — POTASSIUM CHLORIDE CRYS ER 20 MEQ PO TBCR
40.0000 meq | EXTENDED_RELEASE_TABLET | Freq: Once | ORAL | Status: AC
  Administered 2019-10-01: 40 meq via ORAL
  Filled 2019-10-01: qty 2

## 2019-10-01 MED ORDER — FUROSEMIDE 40 MG PO TABS
60.0000 mg | ORAL_TABLET | Freq: Every day | ORAL | Status: DC
  Administered 2019-10-02 – 2019-10-03 (×2): 60 mg via ORAL
  Filled 2019-10-01 (×2): qty 1

## 2019-10-01 MED ORDER — DICLOFENAC SODIUM 1 % EX GEL
2.0000 g | Freq: Four times a day (QID) | CUTANEOUS | Status: DC
  Administered 2019-10-01 – 2019-10-06 (×20): 2 g via TOPICAL
  Filled 2019-10-01: qty 100

## 2019-10-01 NOTE — NC FL2 (Addendum)
[place] MEDICAID FL2 LEVEL OF CARE SCREENING TOOL     IDENTIFICATION  Patient Name: Travis Kelly Birthdate: Dec 12, 1922 Sex: male Admission Date (Current Location): 09/27/2019  [facility] and [place] Number:  Herbalist and Address:  [facility],  [place], [place]      Provider Number: [id number]  Attending Physician Name and Address:  Tawni Millers  Relative Name and Phone Number:       Current Level of Care: Hospital Recommended Level of Care: [place] Prior Approval Number:    Date Approved/Denied:   PASRR Number: [id number]  Discharge Plan: SNF    Current Diagnoses: Patient Active Problem List   Diagnosis Date Noted  . Severe sepsis ([place]) 09/28/2019  . Acute urinary retention 09/28/2019  . Neurogenic bladder 09/28/2019  . Atrial fibrillation with RVR ([place]) 09/28/2019  . CHF (congestive heart failure) ([place]) 09/28/2019  . Pressure injury of skin 09/28/2019  . Acute on chronic diastolic CHF (congestive heart failure) ([place]) 09/22/2019  . PAF (paroxysmal atrial fibrillation) ([place]) 09/22/2019  . Pseudogout of knee, right 05/03/2018  . Chronic diastolic CHF (congestive heart failure) ([place]) 04/18/2018  . Abnormal liver function 04/17/2018  . BPH (benign prostatic hyperplasia) 04/16/2018  . Chronic renal insufficiency, stage 3 (moderate) 07/10/2017  . Degenerative joint disease of shoulder region 03/09/2017  . LBBB (left bundle branch block) 03/09/2017  . Dyspnea on exertion 02/24/2017  . Moderate mitral regurgitation 11/08/2016  . Elevated troponin 07/05/2016  . Normochromic normocytic anemia 07/05/2016  . History of PSVT  07/04/2016  . Blepharitis of upper and lower eyelids of both eyes 04/07/2015  . Pseudophakia of both eyes 04/07/2015  . PVD (posterior vitreous detachment), left 04/07/2015  . Status post cataract extraction and insertion of intraocular lens 01/25/2012  . Cataract, immature  05/10/2011  . Macular degeneration 05/10/2011  . Shoulder pain, bilateral 05/10/2011  . Calculus of kidney 08/13/2010  . Brachial neuritis or radiculitis 07/19/2010  . Cervical spondylosis without myelopathy 07/19/2010  . Lipoma 04/29/2010  . Dyslipidemia 02/07/2007  . Esophageal reflux 02/07/2007    Orientation RESPIRATION BLADDER Height & Weight     Self, Time, Situation, Place  Normal External catheter, Incontinent Weight: 78.8 kg Height:  5\' 6"  (167.6 cm)  BEHAVIORAL SYMPTOMS/MOOD NEUROLOGICAL BOWEL NUTRITION STATUS      Continent Diet (regular)  AMBULATORY STATUS COMMUNICATION OF NEEDS Skin   Extensive Assist Verbally Normal                       Personal Care Assistance Level of Assistance  Bathing, Feeding, Dressing Bathing Assistance: Limited assistance Feeding assistance: Limited assistance Dressing Assistance: Limited assistance     Functional Limitations Info  Sight, Hearing, Speech Sight Info: Adequate Hearing Info: Impaired (hearing aides) Speech Info: Adequate    SPECIAL CARE FACTORS FREQUENCY  PT (By licensed PT), OT (By licensed OT)     PT Frequency: 5xweekly OT Frequency: 5xweekly            Contractures Contractures Info: Not present    Additional Factors Info  Code Status Code Status Info: DNR             Current Medications (10/01/2019):  This is the current hospital active medication list Current Facility-Administered Medications  Medication Dose Route Frequency Provider Last Rate Last Admin  . acetaminophen (TYLENOL) tablet 650 mg  650 mg Oral Q6H PRN Shela Leff, MD   650 mg at 09/30/19 2115  Or  . acetaminophen (TYLENOL) suppository 650 mg  650 mg Rectal Q6H PRN Shela Leff, MD      . amiodarone (PACERONE) tablet 200 mg  200 mg Oral Daily Dinesh Ulysse, Jimmy Picket, MD   200 mg at 10/01/19 0901  . apixaban (ELIQUIS) tablet 5 mg  5 mg Oral BID Ritaj Dullea, Jimmy Picket, MD   5 mg at 10/01/19 0901  . Chlorhexidine  Gluconate Cloth 2 % PADS 6 each  6 each Topical Q0600 Jaiona Simien, Jimmy Picket, MD   6 each at 10/01/19 0430  . diclofenac Sodium (VOLTAREN) 1 % topical gel 2 g  2 g Topical QID Duval Macleod, Jimmy Picket, MD   2 g at 10/01/19 1215  . feeding supplement (KATE FARMS STANDARD 1.4) liquid 325 mL  325 mL Oral Q24H Emaya Preston, Jimmy Picket, MD      . finasteride (PROSCAR) tablet 5 mg  5 mg Oral Daily Bindu Docter, Jimmy Picket, MD   5 mg at 10/01/19 0902  . fluticasone (FLONASE) 50 MCG/ACT nasal spray 1-2 spray  1-2 spray Each Nare Daily PRN Able Malloy, Jimmy Picket, MD      . Derrill Memo ON 10/02/2019] furosemide (LASIX) tablet 60 mg  60 mg Oral Daily Adelin Ventrella, Jimmy Picket, MD      . lip balm (CARMEX) ointment   Topical PRN Paizlee Kinder, Jimmy Picket, MD      . MEDLINE mouth rinse  15 mL Mouth Rinse BID Madden Garron, Jimmy Picket, MD   15 mL at 10/01/19 0901  . metoprolol tartrate (LOPRESSOR) tablet 75 mg  75 mg Oral BID Tawni Millers, MD   75 mg at 10/01/19 1214  . polyvinyl alcohol (LIQUIFILM TEARS) 1.4 % ophthalmic solution 1 drop  1 drop Both Eyes Daily PRN Jeury Mcnab, Jimmy Picket, MD      . senna [facility]) tablet 8.6 mg  1 tablet Oral QPM Jeraline Marcinek, Jimmy Picket, MD   8.6 mg at 09/30/19 1833     Discharge Medications: Please see discharge summary for a list of discharge medications.  Relevant Imaging Results:  Relevant Lab Results:   Additional Information SSN [id number]  Leeroy Cha, RN

## 2019-10-01 NOTE — TOC Progression Note (Addendum)
Transition of Care [facility]) - Progression Note    Patient Details  Name: Travis Kelly MRN: [id number] Date of Birth: 1922-05-23  [facility]) CM/SW Contact  Leeroy Cha, RN Phone Number: 10/01/2019, 1:44 PM  Clinical Narrative:    Spoke with daughter and patient.  Wants go to rehab.  Does not have a perfernce will fax out fl2 to the area snf and see who has a bed. fl2 faxed out via the hub to all area snf. [facility] Expected Discharge Plan: [place] Barriers to Discharge: Continued Medical Work up  Expected Discharge Plan and Services Expected Discharge Plan: [place]       Living arrangements for the past 2 months: Apartment                                       Social Determinants of Health (SDOH) Interventions    Readmission Risk Interventions Readmission Risk Prevention Plan 09/27/2019  Post Dischage Appt Complete  Medication Screening Complete  Transportation Screening Complete  Some recent data might be hidden

## 2019-10-01 NOTE — Progress Notes (Signed)
Initial Nutrition Assessment  RD working remotely.  DOCUMENTATION CODES:   Not applicable  INTERVENTION:  - will order [employer] once/day, each supplement provides 455 kcal and 20 grams protein.  NUTRITION DIAGNOSIS:   Increased nutrient needs related to acute illness as evidenced by estimated needs.  GOAL:   Patient will meet greater than or equal to 90% of their needs  MONITOR:   PO intake, Supplement acceptance, Labs, Weight trends  REASON FOR ASSESSMENT:   Consult Assessment of nutrition requirement/status  ASSESSMENT:   86 year old male who presented to the hospital with difficulty self-catheterizing. He has medical history of CHF, afib, paroxysmal supraventricular tachycardia, dyslipidemia, stage 3 CKD, and chronic neurogenic bladder. Recent hospitalization for decompensated heart failure, discharged 6/18. He was noted to have moderate pitting edema to BLE at the time of admission. CT renal study showed moderate symmetric bilateral perinephric edema, non-obstructing stone 7 mm in R lower kidney, and stones in the bladder.  No intakes documented since admission. Unable to reach patient by phone. Per chart review, weight on 6/18 (date presented to the ED) was 174 lb and weight is up compared to weight over the past 16 months. Flow sheet documentation indicates mild edema to BLE.  Per notes: - acute on chronic urinary retention - AKI on stage 3 CKD - acute on CHF - newly dx R hand arthritis - plan for SNF at the time of d/c   Labs reviewed; Cl: 94 mmol/l, BUN: 30 mg/dl, Ca: 7.9 mg/dl, GFR: 50 ml/min. Medications reviewed; 60 mg oral lasix/day, 40 mEq Klor-Con x2 doses 6/21 and x1 dose 6/22, 1 tablet senokot/day.      NUTRITION - FOCUSED PHYSICAL EXAM:  unable to complete at this time.   Diet Order:   Diet Order            Diet Heart Room service appropriate? Yes; Fluid consistency: Thin  Diet effective now                 EDUCATION NEEDS:   No  education needs have been identified at this time  Skin:  Skin Assessment: Skin Integrity Issues: Skin Integrity Issues:: Stage I Stage I: sacrum  Last BM:  6/20  Height:   Ht Readings from Last 1 Encounters:  09/27/19 5\' 6"  (1.676 m)    Weight:   Wt Readings from Last 1 Encounters:  09/27/19 78.8 kg    Estimated Nutritional Needs:  Kcal:  1500-1700 kcal Protein:  65-75 grams Fluid:  >/= 1.8 L/day     Jarome Matin, MS, RD, LDN, CNSC Inpatient Clinical Dietitian RD pager # available in [facility]  After hours/weekend pager # available in [facility]

## 2019-10-01 NOTE — Progress Notes (Signed)
PROGRESS NOTE    Travis Kelly  OIB:[id number] DOB: 1922/10/24 DOA: 09/27/2019 PCP: [name], MD    Brief Narrative:  Patient was admitted to the hospital with a working diagnosis ofurinary retention, complicated with acute decompensation of systolic heart failure. Sepsis has been ruled out.  86 year old male who presented to the hospital with difficulty self-catheterization. He does have significant past medical history for heart failure, paroxysmal atrial fibrillation, paroxysmal supraventricular tachycardia, dyslipidemia, chronic kidney disease stage III and chronic neurogenic bladder. Recent hospitalization for decompensated heart failure,discharged 6/18. At home patient had difficulty to self catheterize. On his initial physical examination blood pressure 106/73, heart rate 112, respiratory rate 25, oxygen saturation 92%, his lungs had decreased breath sounds at bases, heart S1-S2 present, irregularly irregular, abdomen was soft and nontender, positive bilateral lower extremity edema++/+++ pitting. Sodium 136, potassium 3.6, chloride 97, bicarb 29, glucose 115, BUN 24, creatinine 1.22, white count 17.7, hemoglobin 11.9, hematocrit 36.0, platelets 260. SARS COVID-19 negative. Urinalysis specific gravity 1.014, red cells 11-20, white cells 0-5. CT renal study with moderate symmetric bilateral perinephric edema, nonobstructing stone 7 mm right lower kidney, positive stones in the urinary bladder.Chest radiograph with no infiltrates. EKG 114 bpm, left axis deviation, no QT prolongation, atrial fibrillation, positive PVC, no significant ST segment or T wave changes.  Patient had foley catheter placed and his cardiac medications resumed. His blood pressure has been low consistent with his advance heart failure. Placed on oral furosemide for volume overload.  Responding well to diuresis, but continue to be very weak and deconditioned.   So far patient has diuresed about 6,792  ml since admission, with good toleration. Physical therapy and occupational therapy have recommended transfer to SNF.   Assessment & Plan:   Principal Problem:   Acute urinary retention Active Problems:   Neurogenic bladder   Atrial fibrillation with RVR (HCC)   CHF (congestive heart failure) (HCC)   Pressure injury of skin    1. Acute on chronic urinary retention complicated with AKI on CKD 3b/ hypokalemia.Urine output over last 24 H is 2,850 ml, net fluid balance negative 6,792 ml since admission. Serum cr stable at 1,22, with K at 3,7 and Mg at 2,0. Blood pressure with MAP 60 or above, this am systolic 945 mmHg.   Improved volume status, will continue diuresis with furosemide 60 mg daily to keep negative fluid balance. Will add 40 kcl po x1 today and follow up on renal functio in am.   Kepp foley catheter for now, follow up with urology as outpatient, Dr [name] (from [facility])    On finasteride.  2. Acute on chronic systolic heart failure (biventricular) decompensation LV EF 35 to 40% with global hypokinesis, reduced RV systolic function with pulmonary HTN)/ mitral regurgitation. Patient with biventricular failure and mitral regurgitation, poor prognosis.  Volume status has improved, will continue diuresis with furosemide 60 mg daily, his heart rate has remained elevated 100 to 120, will increase metoprolol to 75 mg po bid, as tolerated. Holding ace inh due to risk of hypotension.   3. Chronic atrial fibrillation with rapid ventricular response.Uncontrolled with rapid ventricular rate, will increase metoprolol to 75 mg po bid and will continue with amiodarone 200 mg daily. Continue telemetry monitoring.   I called the A fib clinic to reschedule his follow up appointment (patient will be called by the office).   4. Stage 1 sacrum ulcer. Present on admission,Will continue with ocal wound care/ consult nutrition.   5. New right hand arthritis.  Positive edema and  tenderness to movement. No erythema or increase local temperature.   No frank gout symptoms. Will add topical diclofenac for now.    Patient continue to be at high risk for worsening heart failure and atrial fibrillation.   Status is: Inpatient  Remains inpatient appropriate because:Inpatient level of care appropriate due to severity of illness   Dispo: The patient is from: Home              Anticipated d/c is to: SNF              Anticipated d/c date is: 2 days              Patient currently is not medically stable to d/c.   DVT prophylaxis: apixaban   Code Status:   dnr   Family Communication:  I spoke with patient's daughter at the bedside, we talked in detail about patient's condition, plan of care and prognosis and all questions were addressed.      Skin Documentation: Pressure Injury 09/28/19 Sacrum Medial Stage 1 -  Intact skin with non-blanchable redness of a localized area usually over a bony prominence. (Active)  09/28/19 0630  Location: Sacrum  Location Orientation: Medial  Staging: Stage 1 -  Intact skin with non-blanchable redness of a localized area usually over a bony prominence.  Wound Description (Comments):   Present on Admission: Yes     Subjective: Patient is feeling better, dyspnea and edema continue to improve. He is very weak and debilitated, significant reduction in physical capacity. Positive right hand edema and numbness, painful to movement.   Objective: Vitals:   10/01/19 0400 10/01/19 0600 10/01/19 0700 10/01/19 0800  BP: (!) 96/58   133/85  Pulse: (!) 124 (!) 129 (!) 105 (!) 131  Resp: (!) 23 (!) 26 (!) 22 (!) 24  Temp: (!) 97.2 F (36.2 C)     TempSrc: Oral     SpO2: 91% (!) 87% 91% 93%  Weight:      Height:        Intake/Output Summary (Last 24 hours) at 10/01/2019 0817 Last data filed at 10/01/2019 0500 Gross per 24 hour  Intake --  Output 2850 ml  Net -2850 ml   Filed Weights   09/27/19 2349  Weight: 78.8 kg     Examination:   General: deconditioned  Neurology: Awake and alert, non focal  E ENT: no pallor, no icterus, oral mucosa moist Cardiovascular: mild JVD. S1-S2 present, rhythmic, no gallops, rubs, or murmurs. + pitting bilateral lower extremity edema. Pulmonary: positive breath sounds bilaterally, no wheezing, rhonchi or rales. Anterior auscultation.  Gastrointestinal. Abdomen with, no organomegaly, non tender, no rebound or guarding Skin. Stage 1 pressure ulcer at the sacrum.  Musculoskeletal: right hand with local edema, no increase local temperature or tender to palpation, limited mobility.  Foley in place.     Data Reviewed: I have personally reviewed following labs and imaging studies  CBC: Recent Labs  Lab 09/27/19 2356 09/28/19 0729 09/29/19 0217 09/30/19 0302  WBC 17.7* 15.5* 12.5* 10.2  NEUTROABS  --   --  10.5* 8.6*  HGB 11.9* 11.9* 12.0* 11.9*  HCT 36.0* 35.9* 36.0* 36.0*  MCV 101.7* 102.9* 100.8* 99.7  PLT 260 240 254 294   Basic Metabolic Panel: Recent Labs  Lab 09/27/19 2356 09/28/19 0729 09/28/19 1000 09/29/19 0217 09/30/19 0302 10/01/19 0207  NA 134*  --  137 134* 138 135  K 4.2  --  4.7 4.2 3.1*  3.7  CL 97*  --  100 95* 97* 94*  CO2 28  --  19* 24 31 31   GLUCOSE 117*  --  91 95 106* 122*  BUN 30*  --  32* 31* 29* 30*  CREATININE 1.48*  --  1.33* 1.24 1.21 1.22  CALCIUM 8.2*  --  8.4* 8.0* 8.0* 7.9*  MG  --  2.0  --   --   --  2.0   GFR: Estimated Creatinine Clearance: 35.8 mL/min (by C-G formula based on SCr of 1.22 mg/dL). Liver Function Tests: No results for input(s): AST, ALT, ALKPHOS, BILITOT, PROT, ALBUMIN in the last 168 hours. No results for input(s): LIPASE, AMYLASE in the last 168 hours. No results for input(s): AMMONIA in the last 168 hours. Coagulation Profile: No results for input(s): INR, PROTIME in the last 168 hours. Cardiac Enzymes: No results for input(s): CKTOTAL, CKMB, CKMBINDEX, TROPONINI in the last 168 hours. BNP  (last 3 results) Recent Labs    10/03/18 1459  PROBNP 4,125*   HbA1C: No results for input(s): HGBA1C in the last 72 hours. CBG: No results for input(s): GLUCAP in the last 168 hours. Lipid Profile: No results for input(s): CHOL, HDL, LDLCALC, TRIG, CHOLHDL, LDLDIRECT in the last 72 hours. Thyroid Function Tests: No results for input(s): TSH, T4TOTAL, FREET4, T3FREE, THYROIDAB in the last 72 hours. Anemia Panel: No results for input(s): VITAMINB12, FOLATE, FERRITIN, TIBC, IRON, RETICCTPCT in the last 72 hours.    Radiology Studies: I have reviewed all of the imaging during this hospital visit personally     Scheduled Meds: . amiodarone  200 mg Oral Daily  . apixaban  5 mg Oral BID  . Chlorhexidine Gluconate Cloth  6 each Topical Q0600  . finasteride  5 mg Oral Daily  . furosemide  60 mg Oral BID  . mouth rinse  15 mL Mouth Rinse BID  . metoprolol tartrate  50 mg Oral BID  . senna  1 tablet Oral QPM   Continuous Infusions:   LOS: 3 days        Travis Reihl Gerome Apley, MD  '

## 2019-10-02 ENCOUNTER — Encounter (HOSPITAL_COMMUNITY): Payer: Self-pay | Admitting: Internal Medicine

## 2019-10-02 DIAGNOSIS — R339 Retention of urine, unspecified: Secondary | ICD-10-CM

## 2019-10-02 DIAGNOSIS — I5043 Acute on chronic combined systolic (congestive) and diastolic (congestive) heart failure: Secondary | ICD-10-CM

## 2019-10-02 DIAGNOSIS — I4891 Unspecified atrial fibrillation: Secondary | ICD-10-CM

## 2019-10-02 DIAGNOSIS — R338 Other retention of urine: Secondary | ICD-10-CM

## 2019-10-02 DIAGNOSIS — N319 Neuromuscular dysfunction of bladder, unspecified: Secondary | ICD-10-CM

## 2019-10-02 LAB — SARS CORONAVIRUS 2 BY RT PCR (HOSPITAL ORDER, PERFORMED IN ~~LOC~~ HOSPITAL LAB): SARS Coronavirus 2: NEGATIVE

## 2019-10-02 LAB — BASIC METABOLIC PANEL
Anion gap: 12 (ref 5–15)
BUN: 29 mg/dL — ABNORMAL HIGH (ref 8–23)
CO2: 32 mmol/L (ref 22–32)
Calcium: 7.9 mg/dL — ABNORMAL LOW (ref 8.9–10.3)
Chloride: 92 mmol/L — ABNORMAL LOW (ref 98–111)
Creatinine, Ser: 1.36 mg/dL — ABNORMAL HIGH (ref 0.61–1.24)
GFR calc Af Amer: 51 mL/min — ABNORMAL LOW (ref >60)
GFR calc non Af Amer: 44 mL/min — ABNORMAL LOW (ref >60)
Glucose, Bld: 104 mg/dL — ABNORMAL HIGH (ref 70–99)
Potassium: 3.9 mmol/L (ref 3.5–5.1)
Sodium: 136 mmol/L (ref 135–145)

## 2019-10-02 MED ORDER — SODIUM CHLORIDE 0.9 % IV BOLUS
250.0000 mL | Freq: Once | INTRAVENOUS | Status: AC
  Administered 2019-10-02: 250 mL via INTRAVENOUS

## 2019-10-02 NOTE — Progress Notes (Signed)
°   10/02/19 1657  Assess: MEWS Score  Temp (!) 101.5 F (38.6 C)  BP 101/62  Pulse Rate (!) 101  Resp (!) 27  SpO2 94 %  O2 Device Room Air  Assess: if the MEWS score is Yellow or Red  Were vital signs taken at a resting state? Yes  Focused Assessment Documented focused assessment  Early Detection of Sepsis Score *See Row Information* High  MEWS guidelines implemented *See Row Information* Yes  Treat  MEWS Interventions Escalated (See documentation below)  Take Vital Signs  Increase Vital Sign Frequency  Red: Q 1hr X 4 then Q 4hr X 4, if remains red, continue Q 4hrs  Escalate  MEWS: Escalate Red: discuss with charge nurse/RN and provider, consider discussing with RRT  Notify: Charge Nurse/RN  Name of Charge Nurse/RN Notified Ardeen Garland RN  Date Charge Nurse/RN Notified 10/02/19  Time Charge Nurse/RN Notified 1700  Notify: Provider  Provider Name/Title D. Sarajane Jews MD  Date Provider Notified 10/02/19  Time Provider Notified 1720  Notification Type Page  Notification Reason Other (Comment) (Temp 101.5, mews red)  Response No new orders  Date of Provider Response 10/02/19  Time of Provider Response 1729

## 2019-10-02 NOTE — Progress Notes (Addendum)
CTSP for red MEWS.   Had temp 101.5 per RN. Pt denies complaints, breathing fine, no pain. Ate some lunch but appetite is poor.  Acute/chronic knee pain. History multiple falls on left knee, has arthritis and knee swells at times.  O: RR stable in 20s SBP 80-90s HR 110s Gen: Appears calm, comfortable sitting in bed Psych: speech fluent and clear CV: irregular, tachycardia Resp: CTA bilaterally, no w/r/r. Abd soft Skin: appears unremarkable. [place]: left knee bigger than right, perhaps mild effusion; nontender, ROM limited by pain. ROM right knee is similar.  A/P Fever may be atelectasis. SBP has been soft. Tachy from afib. There are no s/s of infection other than fever. No evidence to suggest sepsis. Will give small bolus and monitor. Left knee c/w arthritis.  Murray Hodgkins, MD

## 2019-10-02 NOTE — Progress Notes (Signed)
PROGRESS NOTE  Travis Kelly ZWC:[id number] DOB: 09-Jun-1922 DOA: 09/27/2019 PCP: [name], MD  Brief History   86 year old man presented with difficulty with self-catheterization.  Previously admitted with discharge 6/18 for  acute on chronic CHF, acute hypoxic respiratory failure, atrial fibrillation.  Presented same day of discharge 6/18 with difficulty self-catheterization.  Admitted for with concern for sepsis without source.  Sepsis was quickly ruled out.  A & P  Acute on chronic urinary retention resulting in AKI superimposed on CKD IIIa --Keep Foley catheter, follow-up with urology Dr. [name] at [facility] --Continue finasteride  Acute on chronic systolic heart failure, biventricular.  New diagnosis of systolic CHF in the setting of atrial fibrillation with RVR and longstanding moderate to severe MR.  LVEF 35 to 40% with global hypokinesis. Reduced RV systolic function.  Moderate mitral regurgitation (patient not interested in surgical correction), moderate pulmonary hypertension. --Appears to be euvolemic at this point.  Continue furosemi  AKI superimposed on CKD stage IIIa --Appears to be at baseline.  Atrial fibrillation, persistent.  Flecainide was stopped secondary to depressed EF previous admission.  Thought driven by mild regurgitation severe left atrial enlargement.  CHADSVASC  = 3 (age x 2, CHF) --Heart rate variable but up to 120s.  Continue metoprolol at higher dose, amiodarone --Variable heart rate, close monitoring by cardiology, will request further recommendations for 6/24 --Follow-up with atrial fibrillation clinic as an outpatient.  Cardiology plans on cardioversion after 3 weeks on amiodarone and Eliquis.  Stage I sacral ulcer present on admission. Continue wound care.  Right hand arthritis, no gout symptoms. --Acute on chronic.  Monitor clinically.  Prolonged QT  Disposition Plan:  Discussion: Appears to be improved overall, keep Foley catheter, appears to  be near euvolemic.  Main issue appears to be a mildly elevated heart rate, difficult to control atrial fibrillation.  Loss cardiology to evaluate, once cleared from cardiology perspective, anticipate transfer to SNF.  Status is: Inpatient  Remains inpatient appropriate because:Inpatient level of care appropriate due to severity of illness   Dispo: The patient is from: [place] independent [facility]              Anticipated d/c is to: SNF               Anticipated d/c date is: 1 day              Patient currently is not medically stable to d/c.  DVT prophylaxis:   apixaban (ELIQUIS) tablet 5 mg   Code Status: DNR Family Communication: daughter at bedside  Murray Hodgkins, MD  [facility] Direct contact: see [email or web address] (further directions at bottom of note if needed) 7PM-7AM contact night coverage as at bottom of note 10/02/2019, 3:06 PM  LOS: 4 days    Interval History/Subjective  Feels a little worn out today.  Breathing okay.  Tolerating diet.  Objective   Vitals:  Vitals:   10/02/19 0800 10/02/19 1250  BP:  102/78  Pulse:  (!) 108  Resp: (!) 22   Temp:  97.9 F (36.6 C)  SpO2:  95%    Exam:  Constitutional.  Appears calm, comfortable. Respiratory.  Clear to auscultation bilaterally.  No frank wheezes, rales or rhonchi.  Mild increased respiratory effort.  Able to speak in full sentences. Cardiovascular.  Regular rate and rhythm.  No murmur, rub or gallop.  No lower extremity edema. Psychiatric.  Grossly normal mood and affect.  Speech fluent and appropriate. Musculoskeletal.  Mild right hand edema.  I  have personally reviewed the following:   Today's Data  . Creatinine stable at 1.36.  Remainder BMP unremarkable.  Scheduled Meds: . amiodarone  200 mg Oral Daily  . apixaban  5 mg Oral BID  . Chlorhexidine Gluconate Cloth  6 each Topical Q0600  . diclofenac Sodium  2 g Topical QID  . feeding supplement ([employer] STANDARD 1.4)  325 mL Oral Q24H  .  finasteride  5 mg Oral Daily  . furosemide  60 mg Oral Daily  . mouth rinse  15 mL Mouth Rinse BID  . metoprolol tartrate  75 mg Oral BID  . senna  1 tablet Oral QPM   Continuous Infusions:  Principal Problem:   Acute urinary retention Active Problems:   Neurogenic bladder   Atrial fibrillation with RVR (HCC)   CHF (congestive heart failure) (HCC)   Pressure injury of skin   LOS: 4 days   How to contact the [facility] Attending or Consulting provider [place] or covering provider during after hours Day, for this patient?  1. Check the care team in [facility] and look for a) attending/consulting TRH provider listed and b) the [facility] team listed 2. Log into [email or web address] and use [place]'s universal password to access. If you do not have the password, please contact the hospital operator. 3. Locate the [facility] provider you are looking for under [facility] and page to a number that you can be directly reached. 4. If you still have difficulty reaching the provider, please page the [facility] (Director on Call) for the Hospitalists listed on amion for assistance.

## 2019-10-02 NOTE — TOC Progression Note (Signed)
Transition of Care [facility]) - Progression Note    Patient Details  Name: Travis Kelly MRN: [id number] Date of Birth: 12-01-1922  [facility]) CM/SW Contact  Purcell Mouton, RN Phone Number: 10/02/2019, 2:45 PM  Clinical Narrative:       Expected Discharge Plan: Skilled Nursing Facility Barriers to Discharge: Continued Medical Work up  Expected Discharge Plan and Services Expected Discharge Plan: [place]       Living arrangements for the past 2 months: Apartment                                       Social Determinants of Health (SDOH) Interventions    Readmission Risk Interventions Readmission Risk Prevention Plan 09/27/2019  Post Dischage Appt Complete  Medication Screening Complete  Transportation Screening Complete  Some recent data might be hidden

## 2019-10-02 NOTE — TOC Progression Note (Signed)
Transition of Care [facility]) - Progression Note    Patient Details  Name: Travis Kelly MRN: [id number] Date of Birth: 1922/08/28  Transition of Care [facility]) CM/SW Contact  Purcell Mouton, RN Phone Number: 10/02/2019, 2:17 PM  Clinical Narrative:    SNF bed offers list given to pt's daughter. Also, reached out to [facility] SN[facility] for re-eval for admission. Pt will need COVID test.    Expected Discharge Plan: [place] Barriers to Discharge: Continued Medical Work up  Expected Discharge Plan and Services Expected Discharge Plan: [place]       Living arrangements for the past 2 months: Apartment                                       Social Determinants of Health (SDOH) Interventions    Readmission Risk Interventions Readmission Risk Prevention Plan 09/27/2019  Post Dischage Appt Complete  Medication Screening Complete  Transportation Screening Complete  Some recent data might be hidden

## 2019-10-02 NOTE — TOC Progression Note (Signed)
Transition of Care [facility]) - Progression Note    Patient Details  Name: Travis Kelly MRN: [id number] Date of Birth: [birth year]  Transition of Care [facility]) CM/SW Contact  Purcell Mouton, RN Phone Number: 10/02/2019, 2:55 PM  Clinical Narrative:     [facility].  Pt is active with [facility], Dr. Annetta Maw and Kellie Simmering, [place] office [phone], mobile [phone]  Expected Discharge Plan: [place] Barriers to Discharge: Continued Medical Work up  Expected Discharge Plan and Services Expected Discharge Plan: [place]       Living arrangements for the past 2 months: Apartment                                       Social Determinants of Health (SDOH) Interventions    Readmission Risk Interventions Readmission Risk Prevention Plan 09/27/2019  Post Dischage Appt Complete  Medication Screening Complete  Transportation Screening Complete  Some recent data might be hidden

## 2019-10-02 NOTE — Consult Note (Signed)
Cardiology Consultation:   Patient ID: Travis Kelly MRN: [id number]; DOB: 08/23/1922  Admit date: 09/27/2019 Date of Consult: 10/02/2019  Primary Care Provider: [name], MD [facility] Cardiologist: Quay Burow, MD  [facility] Electrophysiologist:  Thompson Grayer, MD    Patient Profile:   Travis Kelly is a 86 y.o. male with a hx of mitral regurgitation, recent acute on chronic diastolic HF, a fib on recent hospitalization, and hs of PSVT but flecainide stopped with drop in EF to 35%, LBBB,  urinary retention/neurogenic bladder (he self catheterizes) discharged 09/27/19 and now admitted 6/19 due to self catheterization difficulty and who is being seen today for the evaluation of atrial fib  at the request of Dr. Sarajane Jews.  History of Present Illness:   Travis Kelly with above hx was recently hospitalized 09/22/19 to 09/27/19.  With that hospitalization he was found to have new a fib and new cardiomyopathy with EF 35-40%, and G2 DD, RV systolic function moderately reduced and size is moderately enlarged, severely elevated pulmonary artery systolic pressure.  LA AND RA severely dilated.  Suspect ruptured chorda(e) tendina(e) to the middle scallop of the posterior mitral leaflet. The severity of the mitral insufficiency jet may be underestimated due to its highly eccentric nature. The mitral valve is myxomatous. Moderate mitral valve regurgitation.   Also mild to moderate TR  He was admitted with HF at that time.  Dr. Burt Knack saw and after discussion with pt and family it was decided to treat conservatively, Rhythm control was felt to be beneficial.  Pt was placed on eliquis and increased metoprolol and amiodarone 200 mg BID added.  Plan for DCCV in 3 weeks.  Was to be seen in a fib clinic.  Also discharged on Lasix 40 mg daily.  No ACE due to recent AKI.   On arrival to ER with tachycardia and tachypneic and hypotensive treated for sepsis.  Today HR in a fib up to 120s, on metoprolol an  amiodarone the metoprolol had been held with hypotension and resumed yesterday but up from 50 BID to 75 BID.  Amiodarone is at 200 daily though EP notes recommend BID    Also lasix resumed at 60 mg daily, had been 60 BID (was discharged on 40 mg daily)   EKG:  The EKG was personally reviewed and demonstrates:  A fib with IVCS, LAD HR 114 no acute changes second EKG at 97  Telemetry:  Telemetry was personally reviewed and demonstrates:  A fib  Today na 136, K+ 3.9, BUN 29, Cr 1.36 was 1.22 yesterday (last week 1.48 to 1.22) Hgb 11.9, WBC 10.2 plts 314 BP 102/78 P 120 to 113.   Past Medical History:  Diagnosis Date  . Age-related macular degeneration, dry, left eye   . Age-related macular degeneration, wet, right eye ([place])   . Arthritis    "right knee" (04/17/2018)  . CHF (congestive heart failure) ([place])   . GERD (gastroesophageal reflux disease)   . Heart murmur   . History of duodenal ulcer   . History of hiatal hernia   . LAFB (left anterior fascicular block)   . Mitral valve disorder    Moderate MR  . Nephrolithiasis 2013  . Pneumonia 1938; ?date   "once as a child; once as an adult" (04/17/2018)  . PSVT (paroxysmal supraventricular tachycardia) ([place]) 06/2016    Past Surgical History:  Procedure Laterality Date  . CARDIAC CATHETERIZATION    . CATARACT EXTRACTION W/ INTRAOCULAR LENS  IMPLANT, BILATERAL Bilateral  2013  . SKIN BIOPSY Left    "thigh; suspected CA; it was not"     Home Medications:  Prior to Admission medications   Medication Sig Start Date End Date Taking? Authorizing Provider  acetaminophen (TYLENOL) 500 MG tablet Take 1,000 mg by mouth daily as needed for headache (pain).    Yes [provider]  amiodarone (PACERONE) 200 MG tablet Take 1 tablet (200 mg total) by mouth daily. 09/28/19 10/28/19 Yes Ghimire, Henreitta Leber, MD  apixaban (ELIQUIS) 5 MG TABS tablet Take 1 tablet (5 mg total) by mouth 2 (two) times daily. 09/27/19  Yes Ghimire, Henreitta Leber, MD  B  Complex-C (SUPER B COMPLEX PO) Take 1 tablet by mouth every evening.   Yes [provider]  Calcium Carb-Cholecalciferol (CALCIUM 500 +D PO) Take 500 mg by mouth every evening.   Yes [provider]  cholecalciferol (VITAMIN D3) 25 MCG (1000 UNIT) tablet Take 1,000 Units by mouth every evening.    Yes [provider]  finasteride (PROSCAR) 5 MG tablet Take 5 mg by mouth daily. 07/25/13  Yes [provider]  fluticasone (FLONASE) 50 MCG/ACT nasal spray Place 1-2 sprays into both nostrils daily as needed for allergies or rhinitis.   Yes [provider]  furosemide (LASIX) 20 MG tablet Take 2 tablets (40 mg total) by mouth daily. 09/27/19  Yes Ghimire, Henreitta Leber, MD  Krill Oil 500 MG CAPS Take 500 mg by mouth every evening.   Yes [provider]  Lutein 20 MG CAPS Take 20 mg by mouth every evening.    Yes [provider]  metoprolol tartrate (LOPRESSOR) 25 MG tablet Take 2 tablets (50 mg total) by mouth 2 (two) times daily. 09/27/19 10/27/19 Yes Ghimire, Henreitta Leber, MD  Multiple Vitamins-Minerals ([place]) CAPS Take 1 capsule by mouth every evening.    Yes [provider]  polyvinyl alcohol (ARTIFICIAL TEARS) 1.4 % ophthalmic solution Place 1 drop into both eyes daily as needed for dry eyes.   Yes [provider]  senna (SENOKOT) 8.6 MG TABS tablet Take 1 tablet by mouth every evening.   Yes [provider]  vitamin C (ASCORBIC ACID) 500 MG tablet Take 500 mg by mouth every evening.    Yes [provider]    Inpatient Medications: Scheduled Meds: . amiodarone  200 mg Oral Daily  . apixaban  5 mg Oral BID  . Chlorhexidine Gluconate Cloth  6 each Topical Q0600  . diclofenac Sodium  2 g Topical QID  . feeding supplement (KATE FARMS STANDARD 1.4)  325 mL Oral Q24H  . finasteride  5 mg Oral Daily  . furosemide  60 mg Oral Daily  . mouth rinse  15 mL Mouth Rinse BID  . metoprolol tartrate   75 mg Oral BID  . senna  1 tablet Oral QPM   Continuous Infusions:  PRN Meds: acetaminophen **OR** acetaminophen, fluticasone, lip balm, polyvinyl alcohol  Allergies:    Allergies  Allergen Reactions  . Rocephin [Ceftriaxone Sodium In Dextrose] Nausea And Vomiting and Other (See Comments)     Hallucinations  Tolerated Keflex course 09/2019  . Barbital Swelling, Rash and Other (See Comments)    Blue lips and swollen tongue    Social History:   Social History   Socioeconomic History  . Marital status: Widowed    Spouse name: Not on file  . Number of children: Not on file  . Years of education: Not on file  .  Highest education level: Not on file  Occupational History  . Not on file  Tobacco Use  . Smoking status: Former Research scientist (life sciences)  . Smokeless tobacco: Never Used  . Tobacco comment: 04/17/2018 "quit smoking when I was 14"  Vaping Use  . Vaping Use: Never used  Substance and Sexual Activity  . Alcohol use: Yes    Alcohol/week: 3.0 standard drinks    Types: 3 Glasses of wine per week  . Drug use: Never  . Sexual activity: Not on file  Other Topics Concern  . Not on file  Social History Narrative  . Not on file   Social Determinants of Health   Financial Resource Strain:   . Difficulty of Paying Living Expenses:   Food Insecurity:   . Worried About Charity fundraiser in the Last Year:   . Arboriculturist in the Last Year:   Transportation Needs:   . Film/video editor (Medical):   Marland Kitchen Lack of Transportation (Non-Medical):   Physical Activity:   . Days of Exercise per Week:   . Minutes of Exercise per Session:   Stress:   . Feeling of Stress :   Social Connections:   . Frequency of Communication with Friends and Family:   . Frequency of Social Gatherings with Friends and Family:   . Attends Religious Services:   . Active Member of Clubs or Organizations:   . Attends Archivist Meetings:   Marland Kitchen Marital Status:   Intimate Partner Violence:   . Fear of  Current or Ex-Partner:   . Emotionally Abused:   Marland Kitchen Physically Abused:   . Sexually Abused:     Family History:    Family History  Problem Relation Age of Onset  . Cancer Mother   . Pneumonia Father      ROS:  Please see the history of present illness.  General:no colds or fevers, no weight changes Skin:no rashes or ulcers HEENT:no blurred vision, no congestion CV:see HPI PUL:see HPI GI:no diarrhea constipation or melena, no indigestion GU:no hematuria, no dysuria, chronic urinary retention with self cath. MS:no joint pain, no claudication Neuro:no syncope, no lightheadedness Endo:no diabetes, no thyroid disease  All other ROS reviewed and negative.     Physical Exam/Data:   Vitals:   10/02/19 0258 10/02/19 0655 10/02/19 0800 10/02/19 1250  BP: 104/79 112/78  102/78  Pulse: (!) 116 (!) 116  (!) 108  Resp: 20 20 (!) 22   Temp: 98.6 F (37 C) 98.1 F (36.7 C)  97.9 F (36.6 C)  TempSrc: Oral Oral  Oral  SpO2: 94% 93%  95%  Weight:      Height:        Intake/Output Summary (Last 24 hours) at 10/02/2019 1531 Last data filed at 10/02/2019 1528 Gross per 24 hour  Intake 210 ml  Output 3500 ml  Net -3290 ml   Last 3 Weights 09/27/2019 09/26/2019 09/25/2019  Weight (lbs) 173 lb 11.6 oz 173 lb 11.6 oz 172 lb 13.5 oz  Weight (kg) 78.8 kg 78.8 kg 78.4 kg     Body mass index is 28.04 kg/m.  EXAM per Dr. Oval Linsey   Relevant CV Studies: Echo 09/24/19 IMPRESSIONS    1. Left ventricular ejection fraction, by estimation, is 35 to 40%. The  left ventricle has moderately decreased function. The left ventricle  demonstrates global hypokinesis. Left ventricular diastolic parameters are  consistent with Grade II diastolic  dysfunction (pseudonormalization).  2. Right ventricular systolic function is  moderately reduced. The right  ventricular size is moderately enlarged. There is severely elevated  pulmonary artery systolic pressure. The estimated right ventricular    systolic pressure is 76.2 mmHg.  3. Left atrial size was severely dilated.  4. Right atrial size was severely dilated.  5. Suspect ruptured chorda(e) tendina(e) to the middle scallop of the  posterior mitral leaflet. The severity of the mitral insufficiency jet may  be underestimated due to its highly eccentric nature. The mitral valve is  myxomatous. Moderate mitral valve  regurgitation.  6. Tricuspid valve regurgitation is mild to moderate.  7. The aortic valve is tricuspid. Aortic valve regurgitation is mild to  moderate.  8. The inferior vena cava is dilated in size with <50% respiratory  variability, suggesting right atrial pressure of 15 mmHg.   Comparison(s): Prior images reviewed side by side. The left ventricular  function is significantly worse.   FINDINGS  Left Ventricle: Left ventricular ejection fraction, by estimation, is 35  to 40%. The left ventricle has moderately decreased function. The left  ventricle demonstrates global hypokinesis. The left ventricular internal  cavity size was normal in size.  There is no left ventricular hypertrophy. Left ventricular diastolic  parameters are consistent with Grade II diastolic dysfunction  (pseudonormalization). Normal left ventricular filling pressure.   Right Ventricle: The right ventricular size is moderately enlarged. No  increase in right ventricular wall thickness. Right ventricular systolic  function is moderately reduced. There is severely elevated pulmonary  artery systolic pressure. The tricuspid  regurgitant velocity is 3.43 m/s, and with an assumed right atrial  pressure of 15 mmHg, the estimated right ventricular systolic pressure is  26.3 mmHg.   Left Atrium: Left atrial size was severely dilated.   Right Atrium: Right atrial size was severely dilated.   Pericardium: There is no evidence of pericardial effusion.   Mitral Valve: Suspect ruptured chorda(e) tendina(e) to the middle scallop  of the  posterior mitral leaflet. The severity of the mitral insufficiency  jet may be underestimated due to its highly eccentric nature. The mitral  valve is myxomatous. Mild to  moderate mitral annular calcification. Moderate mitral valve  regurgitation, with anteriorly-directed jet.   Tricuspid Valve: The tricuspid valve is normal in structure. Tricuspid  valve regurgitation is mild to moderate.   Aortic Valve: The aortic valve is tricuspid. . There is moderate  thickening and mild calcification of the aortic valve. Aortic valve  regurgitation is mild to moderate. Aortic regurgitation PHT measures 417  msec. There is moderate thickening of the aortic  valve. There is mild calcification of the aortic valve.   Pulmonic Valve: The pulmonic valve was normal in structure. Pulmonic valve  regurgitation is mild.   Aorta: The aortic root is normal in size and structure.   Venous: The inferior vena cava is dilated in size with less than 50%  respiratory variability, suggesting right atrial pressure of 15 mmHg.   IAS/Shunts: No atrial level shunt detected by color flow Doppler.      Laboratory Data:  High Sensitivity Troponin:   Recent Labs  Lab 09/22/19 1223 09/22/19 1416  TROPONINIHS 46* 49*     Chemistry Recent Labs  Lab 09/30/19 0302 10/01/19 0207 10/02/19 0440  NA 138 135 136  K 3.1* 3.7 3.9  CL 97* 94* 92*  CO2 31 31 32  GLUCOSE 106* 122* 104*  BUN 29* 30* 29*  CREATININE 1.21 1.22 1.36*  CALCIUM 8.0* 7.9* 7.9*  GFRNONAA 51* 50* 44*  GFRAA  59* 58* 51*  ANIONGAP 10 10 12     No results for input(s): PROT, ALBUMIN, AST, ALT, ALKPHOS, BILITOT in the last 168 hours. Hematology Recent Labs  Lab 09/28/19 0729 09/29/19 0217 09/30/19 0302  WBC 15.5* 12.5* 10.2  RBC 3.49* 3.57* 3.61*  HGB 11.9* 12.0* 11.9*  HCT 35.9* 36.0* 36.0*  MCV 102.9* 100.8* 99.7  MCH 34.1* 33.6 33.0  MCHC 33.1 33.3 33.1  RDW 13.9 13.7 13.6  PLT 240 254 314   BNPNo results for input(s):  BNP, PROBNP in the last 168 hours.  DDimer No results for input(s): DDIMER in the last 168 hours.   Radiology/Studies:  No results found.       NO CHEST PAIN  Assessment and Plan:   1. Atrial fib with RVR, on admit his BB was held now resumed at higher dose.  Also per review of records was supposed to be on amiodarone 200 BID but only on 200 once daily in hospital and at discharge. May need to go bid and decrease BB -plan was for rhythm control and continue eliquis - and DCCV in 3 weeks.  Will defer to Dr. Oval Linsey.  Will need follow up in a fib clinic soon after discharge.  Has appt with Dr. Gwenlyn Found 10/08/19  (Qtc has been prolonged but with LBBB and on amiodarone)  2. Chronic anticoagulation now on eliquis 5 mg BID monitor for bleeding 3. MR middle scallop posterior mitral leaflet dysfunction has seen structural team and plan conservative management 4. Cardiomyopathy/ acute on chronic HF systolic and diastolic with EF 55-97% new last admit could be tachycardia mediated.  And MR could be playing a role as well.  Previously no ACE due to AKI last admit.     5. Secondary pulmonary hypertension from Left heart failure.  6. Urinary retention chronic with self caths previously now foley       For questions or updates, please contact [place] Please consult [email or web address] for contact info under    Signed, Cecilie Kicks, NP  10/02/2019 3:31 PM

## 2019-10-02 NOTE — Progress Notes (Signed)
Patient HR fluctuating 100-120s. Afib Nonsustained. Other VS are stable. No complaints of chest discomfort. Pt is sleeping at this time. Will continue to monitor per previous [place] Vs freq.

## 2019-10-02 NOTE — Progress Notes (Signed)
Physical Therapy Treatment Patient Details Name: Travis Kelly MRN: [id number] DOB: June 07, 1922 Today's Date: 10/02/2019    History of Present Illness 86 yo male readmitted one day after being discharged from hospitalization with volume overload, with inability to catheterize and generalized weakness. PMHx:  L BBB, PSVT, CHF, OA knees, CKD3, mitral regurg, macular degeneration, Atherosclerosis, cardiomegaly, neurogenic bladder    PT Comments    Session limited 2* fatigue this session. Pt performs supine ankle pumps with cues for form, but fatigues requiring rest break; attempted additional strengthening exercises but pt "too tired". Pt's HR 95-133bpm and SpO2 88-92% on RA during exercise, denies chest pain or SOB. Therapist cued pt on pursed lip breathing with exercises to maintain oxygenation. Pt "too tired" to sit up at EOB due to BM earlier this afternoon requiring nursing to clean him up. Offered transfer to recliner but pt declines. Encouraged pt to strength BLE while in bed or transferring to recliner later today if feeling better. Pt's daughter in room encouraging pt with therapy. RN notified of vitals. Pt will benefit from continued physical therapy in hospital and recommendations below to increase strength, balance, endurance for safe ADLs and gait.   Follow Up Recommendations  SNF (HHPT and 24 hour supervision/assist if family chooses to take him home)     Equipment Recommendations       Recommendations for Other Services       Precautions / Restrictions Precautions Precautions: Fall Restrictions Weight Bearing Restrictions: No    Mobility  Bed Mobility     Transfers     Ambulation/Gait      Stairs             Wheelchair Mobility    Modified Rankin (Stroke Patients Only)       Balance           Cognition Arousal/Alertness: Awake/alert Behavior During Therapy: WFL for tasks assessed/performed Overall Cognitive Status: Within Functional Limits for  tasks assessed         Exercises General Exercises - Lower Extremity Ankle Circles/Pumps: Supine;Both;10 reps    General Comments        Pertinent Vitals/Pain Pain Assessment: No/denies pain    Home Living                      Prior Function            PT Goals (current goals can now be found in the care plan section) Acute Rehab PT Goals Patient Stated Goal: to regain strength and return to ILF PT Goal Formulation: With patient/family Time For Goal Achievement: 10/14/19 Potential to Achieve Goals: Good Progress towards PT goals: Progressing toward goals    Frequency    Min 3X/week      PT Plan Current plan remains appropriate    Co-evaluation              AM-PAC PT "6 Clicks" Mobility   Outcome Measure  Help needed turning from your back to your side while in a flat bed without using bedrails?: A Lot Help needed moving from lying on your back to sitting on the side of a flat bed without using bedrails?: A Lot Help needed moving to and from a bed to a chair (including a wheelchair)?: A Lot Help needed standing up from a chair using your arms (e.g., wheelchair or bedside chair)?: A Lot Help needed to walk in hospital room?: A Lot Help needed climbing 3-5 steps with a railing? : Total  6 Click Score: 11    End of Session Equipment Utilized During Treatment: Gait belt Activity Tolerance: Patient limited by fatigue Patient left: in bed;with call bell/phone within reach;with bed alarm set;with family/visitor present Nurse Communication: Mobility status;Other (comment) (HR and SpO2) PT Visit Diagnosis: Unsteadiness on feet (R26.81);Muscle weakness (generalized) (M62.81);Difficulty in walking, not elsewhere classified (R26.2)     Time: 5284-1324 PT Time Calculation (min) (ACUTE ONLY): 17 min  Charges:  $Self Care/Home Management: 8-22                      Talbot Grumbling PT, DPT 10/02/19, 2:35 PM

## 2019-10-02 NOTE — Progress Notes (Signed)
Patients temp 101.5, MEWS red. Tylenol given. BP low with dinamap and manual. Abby, Charge nurse and Christian with rapid response notified as well as Dr. Sarajane Jews. Dr Sarajane Jews and RR nurse at bedside. Patient states he feels "Crummy" and his left knee "aches/throbs." Will continue with plan of care.

## 2019-10-03 DIAGNOSIS — I5042 Chronic combined systolic (congestive) and diastolic (congestive) heart failure: Secondary | ICD-10-CM

## 2019-10-03 LAB — CULTURE, BLOOD (SINGLE): Culture: NO GROWTH

## 2019-10-03 MED ORDER — SODIUM CHLORIDE 0.9 % IV SOLN: INTRAVENOUS | Status: DC

## 2019-10-03 MED ORDER — METOPROLOL TARTRATE 50 MG PO TABS
50.0000 mg | ORAL_TABLET | Freq: Two times a day (BID) | ORAL | Status: DC
  Administered 2019-10-03 – 2019-10-06 (×6): 50 mg via ORAL
  Filled 2019-10-03 (×6): qty 1

## 2019-10-03 MED ORDER — FUROSEMIDE 40 MG PO TABS: 40.0000 mg | ORAL_TABLET | Freq: Every day | ORAL | Status: DC

## 2019-10-03 MED ORDER — AMIODARONE HCL 200 MG PO TABS
200.0000 mg | ORAL_TABLET | Freq: Two times a day (BID) | ORAL | Status: DC
  Administered 2019-10-03 – 2019-10-04 (×2): 200 mg via ORAL
  Filled 2019-10-03 (×2): qty 1

## 2019-10-03 NOTE — Progress Notes (Signed)
Received call from NT 1105 am  taking VS per protocol heart rate elevated HR, since pass 24 hours according to CMT 110's, pt am dose of Amiodarone and Metoprolol administered as ordered for HR 113-130's non-sustaining, back down to the 110's again. Pt resting with eyes closed and wakes easily when name called BP 90/60  O2 sat on RA 92-93%. Discussed with Dr. Sarajane Jews. Will continue to monitor pt, daughter at bedside.

## 2019-10-03 NOTE — Progress Notes (Signed)
PROGRESS NOTE  Vanna Shavers Vara BTD:[id number] DOB: [birth year] DOA: 09/27/2019 PCP: [name], MD  Brief History   86 year old man presented with difficulty with self-catheterization.  Previously admitted with discharge 6/18 for  acute on chronic CHF, acute hypoxic respiratory failure, atrial fibrillation.  Presented same day of discharge 6/18 with difficulty self-catheterization.  Admitted for with concern for sepsis without source.  Sepsis was quickly ruled out.  A & P  Acute on chronic urinary retention resulting in AKI superimposed on CKD IIIa --Keep Foley catheter, follow-up with urology Dr. [name] at [facility] --Continue finasteride  Acute on chronic systolic heart failure, biventricular.  New diagnosis of systolic CHF in the setting of atrial fibrillation with RVR and longstanding moderate to severe MR.  LVEF 35 to 40% with global hypokinesis. Reduced RV systolic function.  Moderate mitral regurgitation (patient not interested in surgical correction), moderate pulmonary hypertension. --Appears to be euvolemic at this point.  Continue Lasix  AKI superimposed on CKD stage IIIa --Appears to be at baseline.  Atrial fibrillation, persistent.  Flecainide was stopped secondary to depressed EF previous admission.  Thought driven by mild regurgitation severe left atrial enlargement.  CHADSVASC  = 3 (age x 2, CHF) --Heart rate variable, difficult to control, agree with going down to metoprolol as per cardiology.  Continue amiodarone twice daily as ordered by cardiology. --Follow-up with atrial fibrillation clinic as an outpatient.  Cardiology plans on cardioversion after 3 weeks on amiodarone and Eliquis.  Stage I sacral ulcer present on admission. Continue wound care.  Right hand arthritis, no gout symptoms. --Acute on chronic.  Monitor clinically.  Prolonged QT  Disposition Plan:  Discussion: Overall appears somewhat better than yesterday although still tachycardic.  Heart rate seem to  be low earlier.  Continue amiodarone and beta-blocker as per cardiology.  If heart rate relatively controlled, can likely transfer to SNF tomorrow.  Status is: Inpatient  Remains inpatient appropriate because:Inpatient level of care appropriate due to severity of illness   Dispo: The patient is from: [facility] independent [facility]              Anticipated d/c is to: SNF               Anticipated d/c date is: 1 day              Patient currently is not medically stable to d/c.  DVT prophylaxis:   apixaban (ELIQUIS) tablet 5 mg   Code Status: DNR Family Communication: daughter again at bedside 6/24  Murray Hodgkins, MD  [facility] Direct contact: see [email or web address] (further directions at bottom of note if needed) 7PM-7AM contact night coverage as at bottom of note 10/03/2019, 5:24 PM  LOS: 5 days    Interval History/Subjective  Tired but otherwise doing okay.  Daughter at bedside.  Objective   Vitals:  Vitals:   10/03/19 1317 10/03/19 1659  BP: (!) 90/56 93/63  Pulse: (!) 102 70  Resp: (!) 21 16  Temp: 99.1 F (37.3 C) 98.8 F (37.1 C)  SpO2: 94% 95%    Exam:  Constitutional.  Appears tired but calm and comfortable. Cardiovascular.  Irregular, tachycardic.  No murmur, rub or gallop.  No lower extremity edema.  Telemetry atrial fibrillation heart rate in the 100s. Respiratory.  Clear to auscultation bilaterally.  No wheezes, rales or rhonchi.  Normal respiratory effort. Musculoskeletal.  Left knee unchanged.  Right ankle unremarkable. Psychiatric.  Grossly normal mood and affect.  Speech fluent and appropriate.  I have personally  reviewed the following:   Today's Data  . No new data  Scheduled Meds: . amiodarone  200 mg Oral BID  . apixaban  5 mg Oral BID  . Chlorhexidine Gluconate Cloth  6 each Topical Q0600  . diclofenac Sodium  2 g Topical QID  . feeding supplement (KATE FARMS STANDARD 1.4)  325 mL Oral Q24H  . finasteride  5 mg Oral Daily  . furosemide   60 mg Oral Daily  . mouth rinse  15 mL Mouth Rinse BID  . metoprolol tartrate  50 mg Oral BID  . senna  1 tablet Oral QPM   Continuous Infusions:  Principal Problem:   Urinary retention Active Problems:   Chronic combined systolic and diastolic heart failure (HCC)   Neurogenic bladder   Atrial fibrillation with RVR (HCC)   CHF (congestive heart failure) (HCC)   Pressure injury of skin   LOS: 5 days   How to contact the [facility] Attending or Consulting provider [place] or covering provider during after hours [place], for this patient?  1. Check the care team in [facility] and look for a) attending/consulting [facility] provider listed and b) the [facility] team listed 2. Log into [email or web address] and use [place]'s universal password to access. If you do not have the password, please contact the hospital operator. 3. Locate the [facility] provider you are looking for under [facility] and page to a number that you can be directly reached. 4. If you still have difficulty reaching the provider, please page the [facility] (Director on Call) for the Hospitalists listed on amion for assistance.

## 2019-10-03 NOTE — Progress Notes (Signed)
Progress Note  Patient Name: Travis Kelly Date of Encounter: 10/03/2019  [facility] Cardiologist: Quay Burow, MD   Subjective   Feeling well.  Denies any palpitations.  Resting comfortably.  His knee is feeling better.  Inpatient Medications    Scheduled Meds: . amiodarone  200 mg Oral BID  . apixaban  5 mg Oral BID  . Chlorhexidine Gluconate Cloth  6 each Topical Q0600  . diclofenac Sodium  2 g Topical QID  . feeding supplement (KATE FARMS STANDARD 1.4)  325 mL Oral Q24H  . finasteride  5 mg Oral Daily  . furosemide  60 mg Oral Daily  . mouth rinse  15 mL Mouth Rinse BID  . metoprolol tartrate  75 mg Oral BID  . senna  1 tablet Oral QPM   Continuous Infusions: . sodium chloride 50 mL/hr at 10/03/19 0518   PRN Meds: acetaminophen **OR** acetaminophen, fluticasone, lip balm, polyvinyl alcohol   Vital Signs    Vitals:   10/03/19 1000 10/03/19 1056 10/03/19 1108 10/03/19 1151  BP: (!) 83/61 101/71 92/65   Pulse: (!) 121 (!) 128 89   Resp:  16    Temp: 98.9 F (37.2 C) 98.9 F (37.2 C)    TempSrc: Oral Oral    SpO2: 92% 98% 100% 95%  Weight:      Height:        Intake/Output Summary (Last 24 hours) at 10/03/2019 1245 Last data filed at 10/03/2019 0900 Gross per 24 hour  Intake 170 ml  Output 1025 ml  Net -855 ml   Last 3 Weights 09/27/2019 09/26/2019 09/25/2019  Weight (lbs) 173 lb 11.6 oz 173 lb 11.6 oz 172 lb 13.5 oz  Weight (kg) 78.8 kg 78.8 kg 78.4 kg      Telemetry    Atrial fibrillation.  Rates 100s to 120s.- Personally Reviewed  ECG    n/a - Personally Reviewed  Physical Exam   VS:  BP 92/65   Pulse 89   Temp 98.9 F (37.2 C) (Oral)   Resp 16   Ht 5\' 6"  (1.676 m)   Wt 78.8 kg   SpO2 95%   BMI 28.04 kg/m  , BMI Body mass index is 28.04 kg/m. GENERAL: Elderly man in no acute distress. HEENT: Pupils equal round and reactive, fundi not visualized, oral mucosa unremarkable NECK:  No jugular venous distention, waveform within  normal limits, carotid upstroke brisk and symmetric, no bruits LUNGS: Mild crackles at the base HEART: Irregularly irregular.  PMI not displaced or sustained,S1 and S2 within normal limits, no S3, no S4, no clicks, no rubs, III/VI holosystolic murmur at the apex ABD:  Flat, positive bowel sounds normal in frequency in pitch, no bruits, no rebound, no guarding, no midline pulsatile mass, no hepatomegaly, no splenomegaly EXT:  2 plus pulses throughout, no edema, no cyanosis no clubbing SKIN:  No rashes no nodules NEURO:  Cranial nerves II through XII grossly intact, motor grossly intact throughout PSYCH:  Cognitively intact, oriented to person place and time]  Labs    High Sensitivity Troponin:   Recent Labs  Lab 09/22/19 1223 09/22/19 1416  TROPONINIHS 46* 49*      Chemistry Recent Labs  Lab 09/30/19 0302 10/01/19 0207 10/02/19 0440  NA 138 135 136  K 3.1* 3.7 3.9  CL 97* 94* 92*  CO2 31 31 32  GLUCOSE 106* 122* 104*  BUN 29* 30* 29*  CREATININE 1.21 1.22 1.36*  CALCIUM 8.0* 7.9* 7.9*  GFRNONAA 51* 50* 44*  GFRAA 59* 58* 51*  ANIONGAP 10 10 12      Hematology Recent Labs  Lab 09/28/19 0729 09/29/19 0217 09/30/19 0302  WBC 15.5* 12.5* 10.2  RBC 3.49* 3.57* 3.61*  HGB 11.9* 12.0* 11.9*  HCT 35.9* 36.0* 36.0*  MCV 102.9* 100.8* 99.7  MCH 34.1* 33.6 33.0  MCHC 33.1 33.3 33.1  RDW 13.9 13.7 13.6  PLT 240 254 314    BNPNo results for input(s): BNP, PROBNP in the last 168 hours.   DDimer No results for input(s): DDIMER in the last 168 hours.   Radiology    No results found.  Cardiac Studies   Echo 09/24/2019: 1. Left ventricular ejection fraction, by estimation, is 35 to 40%. The  left ventricle has moderately decreased function. The left ventricle  demonstrates global hypokinesis. Left ventricular diastolic parameters are  consistent with Grade II diastolic  dysfunction (pseudonormalization).  2. Right ventricular systolic function is moderately  reduced. The right  ventricular size is moderately enlarged. There is severely elevated  pulmonary artery systolic pressure. The estimated right ventricular  systolic pressure is 16.9 mmHg.  3. Left atrial size was severely dilated.  4. Right atrial size was severely dilated.  5. Suspect ruptured chorda(e) tendina(e) to the middle scallop of the  posterior mitral leaflet. The severity of the mitral insufficiency jet may  be underestimated due to its highly eccentric nature. The mitral valve is  myxomatous. Moderate mitral valve  regurgitation.  6. Tricuspid valve regurgitation is mild to moderate.  7. The aortic valve is tricuspid. Aortic valve regurgitation is mild to  moderate.  8. The inferior vena cava is dilated in size with <50% respiratory  variability, suggesting right atrial pressure of 15 mmHg.   Patient Profile     86 y.o. male with newly diagnosed systolic heart failure (LVEF 35%), left bundle branch block, urinary retention and neurogenic bladder admitted with urinary retention.  Cardiology was consulted for management of atrial fibrillation with rapid ventricular response.  Assessment & Plan    #Atrial fibrillation with rapid ventricular response: Travis Kelly's heart rate remains poorly controlled this morning.  Of note, he did not receive his evening dose of metoprolol due to hypotension.  This dose was previously increased from 50 mg to 75.  We will reduce the dose back to 50 mg in the hopes that he will actually be able to get all of the doses.  We will increase amiodarone to 200 mg twice daily, as he has not completed his 5 g load.  Continue anticoagulation with Eliquis.  Other than hypotension he is completely asymptomatic.  If for some reason he is unable to receive future doses of metoprolol or if improved rate control is necessary, he can have digoxin as needed while amiodarone loads.  Would not hold metoprolol unless his SBP is less than 90.  We will add hold  parameters to the orders.  # Chronic systolic and diastolic heart failure: #Moderate to severe MR: LVEF newly noted to be 35-40%.  Due to advanced age family has elected not to have any aggressive interventions.  He also has moderate to severe mitral regurgitation but does not want to pursue MitraClip or other invasive procedures.  Continue metoprolol and Lasix.  He is not on an ARB or Entresto due to hypotension.    # Urinary retention:  Per primary team.  Will add BMP given diuresis.  For questions or updates, please contact [place] Please consult [email or web address] for  contact info under        Signed, Skeet Latch, MD  10/03/2019, 12:45 PM

## 2019-10-04 ENCOUNTER — Inpatient Hospital Stay (HOSPITAL_COMMUNITY): Payer: No Typology Code available for payment source

## 2019-10-04 LAB — CBC
HCT: 34.6 % — ABNORMAL LOW (ref 39.0–52.0)
Hemoglobin: 11.6 g/dL — ABNORMAL LOW (ref 13.0–17.0)
MCH: 33.6 pg (ref 26.0–34.0)
MCHC: 33.5 g/dL (ref 30.0–36.0)
MCV: 100.3 fL — ABNORMAL HIGH (ref 80.0–100.0)
Platelets: 436 10*3/uL — ABNORMAL HIGH (ref 150–400)
RBC: 3.45 MIL/uL — ABNORMAL LOW (ref 4.22–5.81)
RDW: 13.6 % (ref 11.5–15.5)
WBC: 12.9 10*3/uL — ABNORMAL HIGH (ref 4.0–10.5)
nRBC: 0 % (ref 0.0–0.2)

## 2019-10-04 LAB — BASIC METABOLIC PANEL
Anion gap: 9 (ref 5–15)
BUN: 28 mg/dL — ABNORMAL HIGH (ref 8–23)
CO2: 36 mmol/L — ABNORMAL HIGH (ref 22–32)
Calcium: 8.3 mg/dL — ABNORMAL LOW (ref 8.9–10.3)
Chloride: 90 mmol/L — ABNORMAL LOW (ref 98–111)
Creatinine, Ser: 1.26 mg/dL — ABNORMAL HIGH (ref 0.61–1.24)
GFR calc Af Amer: 56 mL/min — ABNORMAL LOW (ref >60)
GFR calc non Af Amer: 48 mL/min — ABNORMAL LOW (ref >60)
Glucose, Bld: 122 mg/dL — ABNORMAL HIGH (ref 70–99)
Potassium: 4 mmol/L (ref 3.5–5.1)
Sodium: 135 mmol/L (ref 135–145)

## 2019-10-04 LAB — HEPATIC FUNCTION PANEL
ALT: 34 U/L (ref 0–44)
AST: 33 U/L (ref 15–41)
Albumin: 2.5 g/dL — ABNORMAL LOW (ref 3.5–5.0)
Alkaline Phosphatase: 145 U/L — ABNORMAL HIGH (ref 38–126)
Bilirubin, Direct: 0.5 mg/dL — ABNORMAL HIGH (ref 0.0–0.2)
Indirect Bilirubin: 1.1 mg/dL — ABNORMAL HIGH (ref 0.3–0.9)
Total Bilirubin: 1.6 mg/dL — ABNORMAL HIGH (ref 0.3–1.2)
Total Protein: 6.5 g/dL (ref 6.5–8.1)

## 2019-10-04 LAB — TSH: TSH: 3.896 u[IU]/mL (ref 0.350–4.500)

## 2019-10-04 LAB — BRAIN NATRIURETIC PEPTIDE: B Natriuretic Peptide: 932.5 pg/mL — ABNORMAL HIGH (ref 0.0–100.0)

## 2019-10-04 MED ORDER — DIGOXIN 0.25 MG/ML IJ SOLN
0.0625 mg | Freq: Once | INTRAMUSCULAR | Status: AC
  Administered 2019-10-04: 0.0625 mg via INTRAVENOUS
  Filled 2019-10-04: qty 2

## 2019-10-04 MED ORDER — FUROSEMIDE 40 MG PO TABS
40.0000 mg | ORAL_TABLET | Freq: Every day | ORAL | Status: DC
  Administered 2019-10-05 – 2019-10-06 (×2): 40 mg via ORAL
  Filled 2019-10-04 (×3): qty 1

## 2019-10-04 MED ORDER — AMIODARONE HCL 200 MG PO TABS
400.0000 mg | ORAL_TABLET | Freq: Two times a day (BID) | ORAL | Status: DC
  Administered 2019-10-04 – 2019-10-06 (×5): 400 mg via ORAL
  Filled 2019-10-04 (×5): qty 2

## 2019-10-04 MED ORDER — DIGOXIN 0.25 MG/ML IJ SOLN
0.2500 mg | Freq: Three times a day (TID) | INTRAMUSCULAR | Status: AC
  Administered 2019-10-04: 0.25 mg via INTRAVENOUS
  Filled 2019-10-04: qty 2

## 2019-10-04 MED ORDER — DIGOXIN 125 MCG PO TABS
0.1250 mg | ORAL_TABLET | Freq: Every day | ORAL | Status: DC
  Administered 2019-10-05 – 2019-10-06 (×2): 0.125 mg via ORAL
  Filled 2019-10-04 (×3): qty 1

## 2019-10-04 NOTE — Progress Notes (Addendum)
Progress Note  Patient Name: Travis Kelly Date of Encounter: 10/04/2019  Primary Cardiologist: Quay Burow, MD  Subjective   Feeling physically uncomfortable staying laid up in the bed, but no overt SOB, palpitations, edema or chest pain. Tachycardic this morning at rest in the 120s.  Inpatient Medications    Scheduled Meds: . amiodarone  200 mg Oral BID  . apixaban  5 mg Oral BID  . Chlorhexidine Gluconate Cloth  6 each Topical Q0600  . diclofenac Sodium  2 g Topical QID  . feeding supplement (KATE FARMS STANDARD 1.4)  325 mL Oral Q24H  . finasteride  5 mg Oral Daily  . [START ON 10/05/2019] furosemide  40 mg Oral Daily  . mouth rinse  15 mL Mouth Rinse BID  . metoprolol tartrate  50 mg Oral BID  . senna  1 tablet Oral QPM   Continuous Infusions:  PRN Meds: acetaminophen **OR** acetaminophen, fluticasone, lip balm, polyvinyl alcohol   Vital Signs    Vitals:   10/03/19 1659 10/03/19 2141 10/04/19 0100 10/04/19 0535  BP: 93/63 105/70 111/75 102/76  Pulse: 70 (!) 118 (!) 112 (!) 103  Resp: 16 20    Temp: 98.8 F (37.1 C) 99.6 F (37.6 C) 99.5 F (37.5 C) 98.6 F (37 C)  TempSrc: Oral Oral Oral Oral  SpO2: 95% 93% 93% 92%  Weight:      Height:        Intake/Output Summary (Last 24 hours) at 10/04/2019 0921 Last data filed at 10/03/2019 1706 Gross per 24 hour  Intake --  Output 950 ml  Net -950 ml   Last 3 Weights 09/27/2019 09/26/2019 09/25/2019  Weight (lbs) 173 lb 11.6 oz 173 lb 11.6 oz 172 lb 13.5 oz  Weight (kg) 78.8 kg 78.8 kg 78.4 kg     Telemetry    Atrial fibrillation/flutter, HR 120s currently - persistently elevated- Personally Reviewed   Physical Exam   GEN: No acute distress.  HEENT: Normocephalic, atraumatic, sclera non-icteric. Neck: No JVD or bruits. Cardiac: Tachycardic, irregularly irregular, no murmurs, rubs, or gallops.  Radials/DP/PT 1+ and equal bilaterally.  Respiratory: Coarse diminished BS at bases bilaterally. Breathing  is unlabored. GI: Soft, nontender, non-distended, BS +x 4. MS: no deformity. Extremities: No clubbing or cyanosis. No edema. Distal pedal pulses are 2+ and equal bilaterally. Neuro:  AAOx3. Follows commands. Psych:  Responds to questions appropriately with a normal affect.  Labs    High Sensitivity Troponin:   Recent Labs  Lab 09/22/19 1223 09/22/19 1416  TROPONINIHS 46* 49*      Cardiac EnzymesNo results for input(s): TROPONINI in the last 168 hours. No results for input(s): TROPIPOC in the last 168 hours.   Chemistry Recent Labs  Lab 09/30/19 0302 10/01/19 0207 10/02/19 0440  NA 138 135 136  K 3.1* 3.7 3.9  CL 97* 94* 92*  CO2 31 31 32  GLUCOSE 106* 122* 104*  BUN 29* 30* 29*  CREATININE 1.21 1.22 1.36*  CALCIUM 8.0* 7.9* 7.9*  GFRNONAA 51* 50* 44*  GFRAA 59* 58* 51*  ANIONGAP 10 10 12      Hematology Recent Labs  Lab 09/28/19 0729 09/29/19 0217 09/30/19 0302  WBC 15.5* 12.5* 10.2  RBC 3.49* 3.57* 3.61*  HGB 11.9* 12.0* 11.9*  HCT 35.9* 36.0* 36.0*  MCV 102.9* 100.8* 99.7  MCH 34.1* 33.6 33.0  MCHC 33.1 33.3 33.1  RDW 13.9 13.7 13.6  PLT 240 254 314    BNPNo results for input(s): BNP,  PROBNP in the last 168 hours.   DDimer No results for input(s): DDIMER in the last 168 hours.   Radiology    No results found.  Cardiac Studies   2D Echo 09/24/19 1. Left ventricular ejection fraction, by estimation, is 35 to 40%. The  left ventricle has moderately decreased function. The left ventricle  demonstrates global hypokinesis. Left ventricular diastolic parameters are  consistent with Grade II diastolic  dysfunction (pseudonormalization).  2. Right ventricular systolic function is moderately reduced. The right  ventricular size is moderately enlarged. There is severely elevated  pulmonary artery systolic pressure. The estimated right ventricular  systolic pressure is 60.1 mmHg.  3. Left atrial size was severely dilated.  4. Right atrial size was  severely dilated.  5. Suspect ruptured chorda(e) tendina(e) to the middle scallop of the  posterior mitral leaflet. The severity of the mitral insufficiency jet may  be underestimated due to its highly eccentric nature. The mitral valve is  myxomatous. Moderate mitral valve  regurgitation.  6. Tricuspid valve regurgitation is mild to moderate.  7. The aortic valve is tricuspid. Aortic valve regurgitation is mild to  moderate.  8. The inferior vena cava is dilated in size with <50% respiratory  variability, suggesting right atrial pressure of 15 mmHg.   Comparison(s): Prior images reviewed side by side. The left ventricular  function is significantly worse.   Patient Profile     86 y.o. male with macular degeneration, PSVT (previously on flecainide), hiatal hernia, arthritis was recently admitted earlier this month with new onset atrial fibrillation with RVR, biventricular dysfunction with EF 35-40% and suspected ruptured mitral valve chordae tendinae with moderate MR, mild-moderate TR. Seen by Dr. Burt Knack in consultation last admission and MitraClip reviewed, but ultimately conservative approach was decided given frailty and very advanced age. He self-caths at home and was admitted with acute on chronic urinary retention. Cardiology consulted for ongoing issues with atrial fib, rate control limited by hypotension. Other hospital issues include stage 1 sacral ulcer this admission, mild anemia (macrocytic at times), hypocalcemia.  Assessment & Plan    1. Acute on chronic urinary retention - foley catheter in, planned f/u with urology as outpatient.  2. Persistent atrial fibrillation with RVR - remains on Eliquis. Rate remains problematic and difficult to treat with hypotension. HR remains in the 120s this morning despite metoprolol and increase in amiodarone to 200mg  BID. Fortunately he remains fairly asymptomatic from cardiac standpoint aside from generalized fatigue. Pulse ox has been  hovering lower end of 90s by vital recordings, and occasionally into the 80s on telemetry with corresponding waveform so hypoxia may be contributing. Will obtain f/u 2V CXR this AM. Will update CBC/BMET/BNP, request weight update, and obtain baseline thyroid function. Lasix not ordered for today, written to begin 40mg  again tomorrow. Original plan was for DCCV in 3 weeks, but will discuss plan with MD given difficult rate control. Consider addition of digoxin or changing amiodarone to IV to more quickly load. It sounds like there is concern about losing potential bed at [facility] but we also need to optimize patient prior to discharge as he will be at risk for rehospitalization. Will need to keep in mind plans for future procedures in accordance with patient/family wishes to avoid aggressive interventions - controlling symptoms is of utmost importance.  3. Recently diagnosed acute biventricular heart failure with mitral valve disease - Due to advanced age family has elected not to have any aggressive interventions such as cath. He also has moderate  to severe mitral regurgitation but does not want to pursue MitraClip or other invasive procedures. He is continued on metoprolol and Lasix as tolerated. Blood pressure otherwise prohibits addition of ACEI/ARB/ARNI or spironolactone. See commentary above.  4. Recent AKI on CKD stage III - creatinine remains near recent baseline.  5. Hypocalcemia - will defer rx to primary team.  Of note, had appt for f/u pending with Dr. Gwenlyn Found 6/29. Will cancel this appt as it is too close to hospitalization to be of benefit.   Addendum: patient's daughter [name] asked for further clarification on his cardiac diagnoses and inquired about concept of palliative care as a family friend had introduced this to them. I discussed the concept with her and she is interested in meeting with them to discuss goals of care and future options should be have recurrent decompensation. She would  like to engage in conversation with them and plans to involve her family. I have called them. Threasa Beards indicated they may not be able to see today but she will pass information along to MD. I made them aware that [name] would be the initial point of contact.  For questions or updates, please contact [place] Please consult [email or web address] for contact info under Cardiology/STEMI.  Signed, Charlie Pitter, PA-C 10/04/2019, 9:21 AM

## 2019-10-04 NOTE — Progress Notes (Addendum)
PROGRESS NOTE  Travis Kelly AYT:[id number] DOB: [birth year] DOA: 09/27/2019 PCP: [name], MD  Brief History   86 year old man presented with difficulty with self-catheterization.  Previously admitted with discharge 6/18 for  acute on chronic CHF, acute hypoxic respiratory failure, atrial fibrillation.  Presented same day of discharge 6/18 with difficulty self-catheterization.  Admitted for with concern for sepsis without source.  Sepsis was quickly ruled out.  A & P  Atrial fibrillation, persistent.  Flecainide was stopped secondary to depressed EF previous admission.  Thought driven by mild regurgitation severe left atrial enlargement.  CHADSVASC  = 3 (age x 2, CHF) --Heart rate remains difficult to control.  Continue amiodarone at increased dose, metoprolol, digoxin added today by cardiology.   --Follow-up with atrial fibrillation clinic as an outpatient.  Cardiology plans on cardioversion after 3 weeks on amiodarone and Eliquis.  Acute on chronic urinary retention resulting in AKI superimposed on CKD IIIa --Keep Foley catheter, adequate urine output.  Follow-up with urology Dr. [name] at [facility] --Continue finasteride  Acute on chronic systolic heart failure, biventricular.  Recent diagnosis of systolic CHF in the setting of atrial fibrillation with RVR and longstanding moderate to severe MR.  LVEF 35 to 40% with global hypokinesis. Reduced RV systolic function.  Moderate mitral regurgitation (patient not interested in surgical correction), moderate pulmonary hypertension. --Appears euvolemic.  Decrease Lasix and restart tomorrow.  AKI superimposed on CKD stage IIIa --Appears to be at baseline.  Stage I sacral ulcer present on admission. Continue wound care.  Disposition Plan:  Discussion: Rate control continues to be in the issue prolonging hospitalization.  Digoxin added, amiodarone increased.  Appreciate cardiology.  Once heart rate controlled, anticipate transfer to skilled  nursing facility.  I agree with involving palliative care for assistance with goals of care and discussion with family.  Appreciate cardiology assistance.  Status is: Inpatient  Remains inpatient appropriate because:Inpatient level of care appropriate due to severity of illness   Dispo: The patient is from: [facility] independent living              Anticipated d/c is to: SNF               Anticipated d/c date is: 1 day              Patient currently is not medically stable to d/c.  DVT prophylaxis:   apixaban (ELIQUIS) tablet 5 mg   Code Status: DNR Family Communication: daughter again at bedside 6/24  Travis Hodgkins, MD  [facility] Direct contact: see [email or web address] (further directions at bottom of note if needed) 7PM-7AM contact night coverage as at bottom of note 10/04/2019, 5:00 PM  LOS: 6 days    Interval History/Subjective  Feels okay today.  Tolerating diet.  Left knee is better today.  Objective   Vitals:  Vitals:   10/04/19 0535 10/04/19 1339  BP: 102/76 97/60  Pulse: (!) 103 (!) 115  Resp:  18  Temp: 98.6 F (37 C) 98.8 F (37.1 C)  SpO2: 92% 90%    Exam:  Constitutional.  Appears calm, comfortable.  Tired. Cardiovascular.  Tachycardic, regular, no murmur, rub or gallop.  No lower extremity edema.  Telemetry heart rate 130s. Respiratory.  Clear to auscultation bilaterally.  No wheezes, rales or rhonchi.  Normal respiratory effort. Musculoskeletal.  Left knee appears unchanged. Psychiatric.  Grossly normal mood and affect.  Speech fluent and appropriate.  I have personally reviewed the following:   Today's Data  . BUN/creatinine without significant  change, 28 and 1.26.  Potassium within normal limits. Marland Kitchen TSH within normal limits. . Hemoglobin stable 11.6. . Modest elevation of alkaline phosphatase and total bilirubin, indirect bilirubin.  Doubt clinically significant. Marland Kitchen BNP elevated 932. Significantly improved compared to admission,  1552  Scheduled Meds: . amiodarone  200 mg Oral BID  . amiodarone  400 mg Oral BID  . apixaban  5 mg Oral BID  . Chlorhexidine Gluconate Cloth  6 each Topical Q0600  . diclofenac Sodium  2 g Topical QID  . digoxin  0.0625 mg Intravenous Once   Followed by  . [START ON 10/05/2019] digoxin  0.125 mg Oral Daily  . feeding supplement (KATE FARMS STANDARD 1.4)  325 mL Oral Q24H  . finasteride  5 mg Oral Daily  . [START ON 10/05/2019] furosemide  40 mg Oral Daily  . mouth rinse  15 mL Mouth Rinse BID  . metoprolol tartrate  50 mg Oral BID  . senna  1 tablet Oral QPM   Continuous Infusions:  Principal Problem:   Urinary retention Active Problems:   Chronic combined systolic and diastolic heart failure (HCC)   Neurogenic bladder   Atrial fibrillation with RVR (HCC)   CHF (congestive heart failure) (HCC)   Pressure injury of skin   LOS: 6 days   How to contact the [facility] Attending or Consulting provider [place] or covering provider during after hours [place], for this patient?  1. Check the care team in [facility] and look for a) attending/consulting TRH provider listed and b) the [facility] team listed 2. Log into [email or web address] and use [place]'s universal password to access. If you do not have the password, please contact the hospital operator. 3. Locate the [facility] provider you are looking for under [facility] and page to a number that you can be directly reached. 4. If you still have difficulty reaching the provider, please page the [facility] (Director on Call) for the Hospitalists listed on amion for assistance.

## 2019-10-04 NOTE — Progress Notes (Signed)
Pt MEWS yellow, not acute changes, pt HR remains unchanged, MDs are aware and treating with meds, will cont to monitor. SRP, RN

## 2019-10-04 NOTE — TOC Progression Note (Signed)
Transition of Care [facility]) - Progression Note    Patient Details  Name: Travis Kelly MRN: [id number] Date of Birth: May 09, 1922  Transition [facility]) CM/SW Contact  Purcell Mouton, RN Phone Number: 10/04/2019, 3:08 PM  Clinical Narrative:    Pt has bed waiting at [facility]. Waiting for pt to be stable for discharge.    Expected Discharge Plan: Skilled Nursing Facility Barriers to Discharge: Continued Medical Work up  Expected Discharge Plan and Services Expected Discharge Plan: [place]       Living arrangements for the past 2 months: Apartment                                       Social Determinants of Health (SDOH) Interventions    Readmission Risk Interventions Readmission Risk Prevention Plan 09/27/2019  Post Dischage Appt Complete  Medication Screening Complete  Transportation Screening Complete  Some recent data might be hidden

## 2019-10-04 NOTE — Progress Notes (Signed)
PT Cancellation Note  Patient Details Name: Travis Kelly MRN: [id number] DOB: [birth year]   Cancelled Treatment:    Reason Eval/Treat Not Completed: Medical issues which prohibited therapy;Patient at procedure or test/unavailable. Per MD and RN, pt's HR 130s supine at rest and with hypoxic moments. Pt going to chest x-ray for further diagnostic testing. Request therapy hold today due to tachycardic/hypoxic and check back tomorrow.   Talbot Grumbling PT, DPT 10/04/19, 11:56 AM

## 2019-10-05 DIAGNOSIS — Z7189 Other specified counseling: Secondary | ICD-10-CM

## 2019-10-05 DIAGNOSIS — Z515 Encounter for palliative care: Secondary | ICD-10-CM

## 2019-10-05 DIAGNOSIS — R531 Weakness: Secondary | ICD-10-CM

## 2019-10-05 LAB — CBC
HCT: 31.5 % — ABNORMAL LOW (ref 39.0–52.0)
Hemoglobin: 10.5 g/dL — ABNORMAL LOW (ref 13.0–17.0)
MCH: 32.7 pg (ref 26.0–34.0)
MCHC: 33.3 g/dL (ref 30.0–36.0)
MCV: 98.1 fL (ref 80.0–100.0)
Platelets: 412 10*3/uL — ABNORMAL HIGH (ref 150–400)
RBC: 3.21 MIL/uL — ABNORMAL LOW (ref 4.22–5.81)
RDW: 13.4 % (ref 11.5–15.5)
WBC: 11.1 10*3/uL — ABNORMAL HIGH (ref 4.0–10.5)
nRBC: 0 % (ref 0.0–0.2)

## 2019-10-05 LAB — BASIC METABOLIC PANEL
Anion gap: 9 (ref 5–15)
BUN: 27 mg/dL — ABNORMAL HIGH (ref 8–23)
CO2: 31 mmol/L (ref 22–32)
Calcium: 7.6 mg/dL — ABNORMAL LOW (ref 8.9–10.3)
Chloride: 90 mmol/L — ABNORMAL LOW (ref 98–111)
Creatinine, Ser: 1.15 mg/dL (ref 0.61–1.24)
GFR calc Af Amer: >60 mL/min (ref >60)
GFR calc non Af Amer: 54 mL/min — ABNORMAL LOW (ref >60)
Glucose, Bld: 93 mg/dL (ref 70–99)
Potassium: 3.5 mmol/L (ref 3.5–5.1)
Sodium: 130 mmol/L — ABNORMAL LOW (ref 135–145)

## 2019-10-05 LAB — SARS CORONAVIRUS 2 BY RT PCR (HOSPITAL ORDER, PERFORMED IN ~~LOC~~ HOSPITAL LAB): SARS Coronavirus 2: NEGATIVE

## 2019-10-05 NOTE — Progress Notes (Signed)
Progress Note   Subjective   Doing well today, the patient denies CP or SOB.  No new concerns  Inpatient Medications    Scheduled Meds:  amiodarone  400 mg Oral BID   apixaban  5 mg Oral BID   Chlorhexidine Gluconate Cloth  6 each Topical Q0600   diclofenac Sodium  2 g Topical QID   digoxin  0.125 mg Oral Daily   feeding supplement (KATE FARMS STANDARD 1.4)  325 mL Oral Q24H   finasteride  5 mg Oral Daily   furosemide  40 mg Oral Daily   mouth rinse  15 mL Mouth Rinse BID   metoprolol tartrate  50 mg Oral BID   senna  1 tablet Oral QPM   Continuous Infusions:  PRN Meds: acetaminophen **OR** acetaminophen, fluticasone, lip balm, polyvinyl alcohol   Vital Signs    Vitals:   10/04/19 2148 10/05/19 0331 10/05/19 0500 10/05/19 0539  BP: 103/68   (!) 97/59  Pulse: (!) 110   72  Resp: 16   18  Temp: 97.8 F (36.6 C)   98.7 F (37.1 C)  TempSrc: Oral   Oral  SpO2: 92%   99%  Weight:  82.7 kg 82.7 kg   Height:        Intake/Output Summary (Last 24 hours) at 10/05/2019 0755 Last data filed at 10/05/2019 6195 Gross per 24 hour  Intake 240 ml  Output 1000 ml  Net -760 ml   Filed Weights   09/27/19 2349 10/05/19 0331 10/05/19 0500  Weight: 78.8 kg 82.7 kg 82.7 kg    Telemetry    Afib, V rates mostly below 100 bpm - Personally Reviewed  Physical Exam   GEN- The patient is elderly appearing, alert and oriented x 3 today.   Head- normocephalic, atraumatic Eyes-  Sclera clear, conjunctiva pink Ears- hearing intact Oropharynx- clear Neck- supple, Lungs-  normal work of breathing Heart- irregular rate and rhythm  GI- soft  Extremities- no clubbing, cyanosis, or edema  MS- no significant deformity or atrophy Skin- no rash or lesion Psych- euthymic mood, full affect Neuro- strength and sensation are intact   Labs    Chemistry Recent Labs  Lab 10/02/19 0440 10/04/19 0949 10/05/19 0458  NA 136 135 130*  K 3.9 4.0 3.5  CL 92* 90* 90*  CO2  32 36* 31  GLUCOSE 104* 122* 93  BUN 29* 28* 27*  CREATININE 1.36* 1.26* 1.15  CALCIUM 7.9* 8.3* 7.6*  PROT  --  6.5  --   ALBUMIN  --  2.5*  --   AST  --  33  --   ALT  --  34  --   ALKPHOS  --  145*  --   BILITOT  --  1.6*  --   GFRNONAA 44* 48* 54*  GFRAA 51* 56* >60  ANIONGAP 12 9 9      Hematology Recent Labs  Lab 09/30/19 0302 10/04/19 0949 10/05/19 0458  WBC 10.2 12.9* 11.1*  RBC 3.61* 3.45* 3.21*  HGB 11.9* 11.6* 10.5*  HCT 36.0* 34.6* 31.5*  MCV 99.7 100.3* 98.1  MCH 33.0 33.6 32.7  MCHC 33.1 33.5 33.3  RDW 13.6 13.6 13.4  PLT 314 436* 412*     Patient ID  86 y.o. male with newly diagnosed systolic heart failure (LVEF 35%), left bundle branch block, urinary retention and neurogenic bladder admitted with urinary retention.  Cardiology was consulted for management of atrial fibrillation.  Assessment & Plan  1.  afib Currently rate controlled Continue current medicine regimen, including amiodarone, metoprolol and eliquis eliquis started 09/24/2019.  Will need follow-up in AF clinic in 1-2 weeks to arrange outpatient TEE guided cardioversion 3 weeks after initiation of eliquis  No new recs today  2. Chronic systolic dysfunction clinically stable  Ok to discharge from a cardiology standpoint.  He has follow-up already arranged.  Thompson Grayer MD, [facility] 10/05/2019 7:55 AM

## 2019-10-05 NOTE — TOC Progression Note (Signed)
Transition of Care [facility]) - Progression Note    Patient Details  Name: Travis Kelly MRN: [id number] Date of Birth: [birth year]  Transition of Care [facility]) CM/SW Contact  Lennart Pall, LCSW Phone Number: 10/05/2019, 1:27 PM  Clinical Narrative:   Alerted by MD that pt should be medically ready for d/c tomorrow to [facility].  I have spoken with admissions coordinator, Loree Fee [phone]), who confirms they can admit tomorrow.  MD to order another COVID test per facility. Daughter in room and reviewed with her and pt the plan for transfer tomorrow via [facility].  Both pleased and no concerns.    Expected Discharge Plan: Skilled Nursing Facility Barriers to Discharge: Continued Medical Work up  Expected Discharge Plan and Services Expected Discharge Plan: [place]       Living arrangements for the past 2 months: Apartment                                       Social Determinants of Health (SDOH) Interventions    Readmission Risk Interventions Readmission Risk Prevention Plan 09/27/2019  Post Dischage Appt Complete  Medication Screening Complete  Transportation Screening Complete  Some recent data might be hidden

## 2019-10-05 NOTE — Progress Notes (Signed)
PROGRESS NOTE  Travis Kelly WKG:[id number] DOB: 1923/01/16 DOA: 09/27/2019 PCP: [name], MD  Brief History   86 year old man presented with difficulty with self-catheterization.  Previously admitted with discharge 6/18 for  acute on chronic CHF, acute hypoxic respiratory failure, atrial fibrillation.  Presented same day of discharge 6/18 with difficulty self-catheterization.  Admitted for with concern for sepsis without source.  Sepsis was quickly ruled out.  A & P  Atrial fibrillation, persistent.  Flecainide was stopped secondary to depressed EF previous admission.  Thought driven by mild regurgitation severe left atrial enlargement.  CHADSVASC  = 3 (age x 2, CHF) --Heart rate now appears to be controlled with addition of digoxin.  We will continue amiodarone and metoprolol as well.  Continue apixaban. --Follow-up with atrial fibrillation clinic as an outpatient.  Cardiology plans on outpatient cardioversion after 3 weeks on amiodarone and Eliquis.  Acute on chronic urinary retention resulting in AKI superimposed on CKD IIIa --Keep Foley catheter, adequate urine output.  Follow-up with urology Dr. [name] at [facility] --Continue finasteride  Acute on chronic systolic heart failure, biventricular.  Recent diagnosis of systolic CHF in the setting of atrial fibrillation with RVR and longstanding moderate to severe MR.  LVEF 35 to 40% with global hypokinesis. Reduced RV systolic function.  Moderate mitral regurgitation (patient not interested in surgical correction), moderate pulmonary hypertension. --Appears euvolemic.  Continue Lasix.  AKI superimposed on CKD stage IIIa --AKI resolved.  Stage I sacral ulcer present on admission. Continue wound care.  Disposition Plan:  Discussion: Heart rate now appears controlled at present time.  Given high readmission risk and difficulty controlling rate, will continue present medications and monitor overnight.  If remains stable overnight, will  transfer to [facility] 6/27.  Appreciate palliative medicine consult.  Status is: Inpatient  Remains inpatient appropriate because:Inpatient level of care appropriate due to severity of illness   Dispo: The patient is from: [facility] independent living              Anticipated d/c is to: SNF with palliative service to follow.              Anticipated d/c date is: 1 day              Patient currently is not medically stable to d/c.  DVT prophylaxis:   apixaban (ELIQUIS) tablet 5 mg  Code Status: DNR Family Communication: Different daughter at bedside 6/26  Murray Hodgkins, MD  [facility] Direct contact: see [email or web address] (further directions at bottom of note if needed) 7PM-7AM contact night coverage as at bottom of note 10/05/2019, 2:12 PM  LOS: 7 days    Interval History/Subjective  Feels all right today.  Objective   Vitals:  Vitals:   10/05/19 1204 10/05/19 1208  BP: 93/64   Pulse: 83 92  Resp:    Temp:    SpO2:      Exam:  Constitutional.  Appears calm and comfortable.  Appears better today. Respiratory.  Clear to auscultation bilaterally.  No wheezes, rales or rhonchi.  Normal respiratory effort. Cardiovascular.  Irregular, normal rate, no murmur, rub or gallop.  No lower extremity edema noted.  Telemetry atrial fibrillation rate 80s-90s. Psychiatric.  Grossly normal mood and affect.  Speech fluent and appropriate.  I have personally reviewed the following:   Today's Data  . Sodium 130.  Potassium 3.5.  Creatinine back to normal 1.15. Marland Kitchen Hemoglobin stable at 10.5.  Scheduled Meds: . amiodarone  400 mg Oral BID  . apixaban  5 mg Oral BID  . Chlorhexidine Gluconate Cloth  6 each Topical Q0600  . diclofenac Sodium  2 g Topical QID  . digoxin  0.125 mg Oral Daily  . feeding supplement (KATE FARMS STANDARD 1.4)  325 mL Oral Q24H  . finasteride  5 mg Oral Daily  . furosemide  40 mg Oral Daily  . mouth rinse  15 mL Mouth Rinse BID  . metoprolol tartrate  50 mg  Oral BID  . senna  1 tablet Oral QPM   Continuous Infusions:  Principal Problem:   Urinary retention Active Problems:   Chronic combined systolic and diastolic heart failure (HCC)   Neurogenic bladder   Atrial fibrillation with RVR (HCC)   CHF (congestive heart failure) (HCC)   Pressure injury of skin   Palliative care by specialist   Goals of care, counseling/discussion   General weakness   LOS: 7 days   How to contact the [facility] Attending or Consulting provider [place] or covering provider during after hours [place], for this patient?  1. Check the care team in [facility] and look for a) attending/consulting TRH provider listed and b) the [facility] team listed 2. Log into [email or web address] and use [place]'s universal password to access. If you do not have the password, please contact the hospital operator. 3. Locate the [facility] provider you are looking for under [facility] and page to a number that you can be directly reached. 4. If you still have difficulty reaching the provider, please page the [facility] (Director on Call) for the Hospitalists listed on amion for assistance.

## 2019-10-05 NOTE — Consult Note (Signed)
Consultation Note Date: 10/05/2019   Patient Name: Travis Kelly  DOB: 16-May-1922  MRN: [id number]  Age / Sex: 86 y.o., male  PCP: [name], MD Referring Physician: Samuella Cota, MD  Reason for Consultation: Establishing goals of care  HPI/Patient Profile: 86 y.o. 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    Clinical Assessment and Goals of Care: Patient is being followed by cardiology as well, patient's cardiac medications are being optimized.   A palliative care consult has also been requested for additional layer of support.   [name] is resting in bed, his daughter is at the bedside. Introduced myself and palliative care as follows: Palliative medicine is specialized medical care for people living with serious illness. It focuses on providing relief from the symptoms and stress of a serious illness. The goal is to improve quality of life for both the patient and the family.  Goals of care: Broad aims of medical therapy in relation to the patient's values and preferences. Our aim is to provide medical care aimed at enabling patients to achieve the goals that matter most to them, given the circumstances of their particular medical situation and their constraints.   Goals, wishes and values important to the patient and family attempted to be explored. Brief life review also performed, patient is a [employer] veteran, and he was thanked for his services to our country, he is service connected through the [place] to some extent. He is awake alert, in no distress. He denies shortness of breath, current off supplemental O2, chart pulled up and vitals discussed with daughter who is at the  bedside.   We discussed about scope of palliative services at the rehab facility in some detail. We discussed about decline trajectory from a cardiac standpoint, usually seen signs and symptoms of such a trajectory and how palliative services and later on, hospice services can be of benefit. All of her questions addressed to the best of my ability.   HCPOA  Patient has 5 kids, some of them live locally. Daughter who is here at the bedside this morning is visiting from [place].   SUMMARY OF RECOMMENDATIONS    Agree with DNR Agree with palliative consult at rehab facility.   Code Status/Advance Care Planning:  DNR    Symptom Management:    as above   Palliative Prophylaxis:   Delirium Protocol    Psycho-social/Spiritual:   Desire for further Chaplaincy support:yes  Additional Recommendations: Education on Hospice  Prognosis:   < 12 months  Discharge Planning: [place] for rehab with Palliative care service follow-up      Primary Diagnoses: Present on Admission: **None**   I have reviewed the medical record, interviewed the patient and family, and examined the patient. The following aspects are pertinent.  Past Medical History:  Diagnosis Date  . Age-related macular degeneration, dry, left eye   . Age-related macular degeneration, wet, right eye ([place])   . Arthritis    "right knee" (04/17/2018)  . CHF (congestive heart failure) ([place])   . GERD (gastroesophageal reflux disease)   . Heart murmur   . History of duodenal ulcer   . History of hiatal hernia   . LAFB (left anterior fascicular block)   . Mitral valve disorder    Moderate MR  . Nephrolithiasis 2013  . Pneumonia 1938; ?date   "once as a child; once as an adult" (04/17/2018)  . PSVT (paroxysmal supraventricular tachycardia) ([place]) 06/2016   Social History   Socioeconomic History  . Marital status: Widowed    Spouse name: Not on file  . Number of children: Not on file  . Years  of education: Not on file  . Highest education level: Not on file  Occupational History  . Not on file  Tobacco Use  . Smoking status: Former Research scientist (life sciences)  . Smokeless tobacco: Never Used  . Tobacco comment: 04/17/2018 "quit smoking when I was 14"  Vaping Use  . Vaping Use: Never used  Substance and Sexual Activity  . Alcohol use: Yes    Alcohol/week: 3.0 standard drinks    Types: 3 Glasses of wine per week  . Drug use: Never  . Sexual activity: Not on file  Other Topics Concern  . Not on file  Social History Narrative  . Not on file   Social Determinants of Health   Financial Resource Strain:   . Difficulty of Paying Living Expenses:   Food Insecurity:   . Worried About Charity fundraiser in the Last Year:   . Arboriculturist in the Last Year:   Transportation Needs:   . Film/video editor (Medical):   Marland Kitchen Lack of Transportation (Non-Medical):   Physical Activity:   . Days of Exercise per Week:   . Minutes of Exercise per Session:   Stress:   . Feeling of Stress :   Social Connections:   . Frequency of Communication with Friends and Family:   . Frequency of Social Gatherings with Friends and Family:   . Attends Religious Services:   . Active Member of Clubs or Organizations:   . Attends Archivist Meetings:   Marland Kitchen Marital Status:    Family History  Problem Relation Age of Onset  . Cancer Mother   . Pneumonia Father    Scheduled Meds: . amiodarone  400 mg Oral BID  . apixaban  5 mg Oral BID  . Chlorhexidine Gluconate Cloth  6 each Topical Q0600  . diclofenac Sodium  2 g Topical QID  . digoxin  0.125 mg Oral Daily  . feeding supplement ([place] STANDARD 1.4)  325 mL Oral Q24H  . finasteride  5 mg Oral Daily  . furosemide  40 mg Oral Daily  . mouth rinse  15 mL Mouth Rinse BID  . metoprolol tartrate  50 mg Oral BID  . senna  1 tablet Oral QPM   Continuous Infusions: PRN Meds:.acetaminophen **OR** acetaminophen, fluticasone, lip balm, polyvinyl  alcohol Medications Prior to Admission:  Prior to Admission medications   Medication Sig Start Date End Date Taking? Authorizing Provider  acetaminophen (TYLENOL) 500 MG tablet Take 1,000 mg by mouth daily as needed for headache (pain).    Yes [provider]  amiodarone (PACERONE) 200 MG tablet Take 1 tablet (200 mg total) by mouth daily.  09/28/19 10/28/19 Yes Ghimire, Henreitta Leber, MD  apixaban (ELIQUIS) 5 MG TABS tablet Take 1 tablet (5 mg total) by mouth 2 (two) times daily. 09/27/19  Yes Ghimire, Henreitta Leber, MD  B Complex-C (SUPER B COMPLEX PO) Take 1 tablet by mouth every evening.   Yes [provider]  Calcium Carb-Cholecalciferol (CALCIUM 500 +D PO) Take 500 mg by mouth every evening.   Yes [provider]  cholecalciferol (VITAMIN D3) 25 MCG (1000 UNIT) tablet Take 1,000 Units by mouth every evening.    Yes [provider]  finasteride (PROSCAR) 5 MG tablet Take 5 mg by mouth daily. 07/25/13  Yes [provider]  fluticasone (FLONASE) 50 MCG/ACT nasal spray Place 1-2 sprays into both nostrils daily as needed for allergies or rhinitis.   Yes [provider]  furosemide (LASIX) 20 MG tablet Take 2 tablets (40 mg total) by mouth daily. 09/27/19  Yes Ghimire, Henreitta Leber, MD  Krill Oil 500 MG CAPS Take 500 mg by mouth every evening.   Yes [provider]  Lutein 20 MG CAPS Take 20 mg by mouth every evening.    Yes [provider]  metoprolol tartrate (LOPRESSOR) 25 MG tablet Take 2 tablets (50 mg total) by mouth 2 (two) times daily. 09/27/19 10/27/19 Yes Ghimire, Henreitta Leber, MD  Multiple Vitamins-Minerals ([place]) CAPS Take 1 capsule by mouth every evening.    Yes [provider]  polyvinyl alcohol (ARTIFICIAL TEARS) 1.4 % ophthalmic solution Place 1 drop into both eyes daily as needed for dry eyes.   Yes [provider]  senna (SENOKOT) 8.6 MG TABS tablet Take 1 tablet by mouth every evening.   Yes  [provider]  vitamin C (ASCORBIC ACID) 500 MG tablet Take 500 mg by mouth every evening.    Yes [provider]   Allergies  Allergen Reactions  . Rocephin [Ceftriaxone Sodium In Dextrose] Nausea And Vomiting and Other (See Comments)     Hallucinations  Tolerated Keflex course 09/2019  . Barbital Swelling, Rash and Other (See Comments)    Blue lips and swollen tongue   Review of Systems Denies chest pain Denies shortness of breath  Physical Exam Elderly gentleman Resting in bed Regular work of breathing No distress Abdomen not distended No edema  Vital Signs: BP (!) 97/59 (BP Location: Left Arm)   Pulse 72   Temp 98.7 F (37.1 C) (Oral)   Resp 18   Ht 5\' 6"  (1.676 m)   Wt 82.7 kg   SpO2 99%   BMI 29.43 kg/m  Pain Scale: 0-10   Pain Score: 0-No pain   SpO2: SpO2: 99 % O2 Device:SpO2: 99 % O2 Flow Rate: .O2 Flow Rate (L/min): 2 L/min  IO: Intake/output summary:   Intake/Output Summary (Last 24 hours) at 10/05/2019 1059 Last data filed at 10/05/2019 0240 Gross per 24 hour  Intake 240 ml  Output 1000 ml  Net -760 ml    LBM: Last BM Date: 10/03/19 Baseline Weight: Weight: 78.8 kg Most recent weight: Weight: 82.7 kg     Palliative Assessment/Data:   PPS 40%  Time In:  10 Time Out:  11 Time Total:  60  Greater than 50%  of this time was spent counseling and coordinating care related to the above assessment and plan.  Signed by: Loistine Chance, MD   Please contact Palliative Medicine Team phone at [phone] for questions and concerns.  For individual provider: See Shea Evans

## 2019-10-06 DIAGNOSIS — N179 Acute kidney failure, unspecified: Secondary | ICD-10-CM

## 2019-10-06 MED ORDER — METOPROLOL TARTRATE 50 MG PO TABS: 50.0000 mg | ORAL_TABLET | Freq: Two times a day (BID) | ORAL | Status: AC

## 2019-10-06 MED ORDER — KATE FARMS STANDARD 1.4 PO LIQD: 325.0000 mL | ORAL | Status: AC

## 2019-10-06 MED ORDER — AMIODARONE HCL 200 MG PO TABS: 200.0000 mg | ORAL_TABLET | Freq: Two times a day (BID) | ORAL | Status: AC

## 2019-10-06 MED ORDER — DIGOXIN 125 MCG PO TABS: 0.1250 mg | ORAL_TABLET | Freq: Every day | ORAL | Status: AC

## 2019-10-06 NOTE — TOC Transition Note (Signed)
Transition of Care [facility]) - CM/SW Discharge Note   Patient Details  Name: Travis Kelly MRN: [id number] Date of Birth: 1922/07/27  [facility] CM/SW Contact:  Servando Snare, LCSW Phone Number: 10/06/2019, 9:36 AM   Clinical Narrative:   Patient to transfer to [facility] for rehab. Patient to report to room 7003. Patient to transport by [facility]. RN report number- [phone].  Servando Snare, [place] [phone]   Final next level of care: [place] Barriers to Discharge: No Barriers Identified   Patient Goals and CMS Choice Patient states their goals for this hospitalization and ongoing recovery are:: to return to [place] CMS Medicare.gov Compare Post Acute Care list provided to:: Patient Choice offered to / list presented to : Patient  Discharge Placement                Patient to be transferred to facility by: EMS      Discharge Plan and Services                DME Arranged: N/A DME Agency: NA       HH Arranged: NA HH Agency: NA        Social Determinants of Health (SDOH) Interventions     Readmission Risk Interventions Readmission Risk Prevention Plan 09/27/2019  Post Dischage Appt Complete  Medication Screening Complete  Transportation Screening Complete  Some recent data might be hidden

## 2019-10-06 NOTE — Discharge Summary (Addendum)
Physician Discharge Summary  Travis Kelly JQG:[id number] DOB: Nov 19, 1922 DOA: 09/27/2019  PCP: [name], MD  Admit date: 09/27/2019 Discharge date: 10/06/2019  Recommendations for Outpatient Follow-up:  1. Atrial fibrillation, persistent.  Follow-up with atrial fibrillation clinic as an outpatient.  Cardiology plans on outpatient cardioversion after adequate time on apixaban and will arrange follow-up in the atrial fibrillation clinic in 1 to 2 weeks. 2. Acute on chronic urinary retention resulting in AKI superimposed on CKD IIIa. Keep Foley catheter, adequate urine output.  Follow-up with urology Travis Obey FNP or Dr. Rosana Kelly at [facility] 3. Stage I sacral ulcer present on admission  4. Palliative care -- [facility] through [facility], [name], FNP. Schedule an appointment as soon as possible for a visit in 2 week(s).   Specialty: Family Medicine Why: follow-up urinary retention Contact information: [address] [place] [address] [place] [facility] up.   Why: Atrial fibrillation office will call you with an appointment Contact information: [place] [address] [phone]              Discharge Diagnoses: Principal diagnosis is #1 1. Acute on chronic urinary retention resulting in AKI superimposed on CKD IIIa 2. AKI superimposed on CKD stage IIIa 3. Atrial fibrillation, persistent 4. Acute on chronic systolic heart failure, biventricular 5. Stage I sacral ulcer present on admission  Discharge Condition: improved Disposition: SNF shortterm  Diet recommendation: heart healthy  Filed Weights   10/05/19 0331 10/05/19 0500 10/06/19 0500  Weight: 82.7 kg 82.7 kg 79.6 kg    History of present illness:  86 year old man presented with difficulty with self-catheterization.  Previously admitted with discharge  6/18 for  acute on chronic CHF, acute hypoxic respiratory failure, atrial fibrillation.  Presented same day of discharge 6/18 with difficulty self-catheterization.  Admitted for with concern for sepsis without source.    Hospital Course:  Sepsis was quickly ruled out.  Acute kidney injury resolved with Foley catheter insertion.  Given recurrent retention, will keep until follow-up as an outpatient.  Acute on chronic heart failure responded well to diuresis.  Mild hypoxic respiratory failure secondary to CHF and will probably resolve in the near future.  Main issue prolonging hospitalization was atrial fibrillation with a rapid heart rate that was difficult to control.  Seen by cardiology, after multiple adjustments of medications, rate now controlled and patient is stable for discharge.  Atrial fibrillation, persistent.  Flecainide was stopped secondary to depressed EF on previous admission.  Thought driven by mild regurgitation severe left atrial enlargement.  CHADSVASC = 3 (age x 2, CHF) --Heart rate was difficult to control but now is stable.  We will continue on digoxin and beta-blocker.  Decrease amiodarone to 200 mg twice daily at discharge per cardiology. --Follow-up with atrial fibrillation clinic as an outpatient.  Cardiology plans on outpatient cardioversion after adequate time on apixaban and will arrange follow-up in the atrial fibrillation clinic in 1 to 2 weeks.  Acute on chronic urinary retention resulting in AKI superimposed on CKD IIIa --Keep Foley catheter, adequate urine output.  Follow-up with urology Travis Obey FNP or Dr. Rosana Kelly at [facility]  Acute on chronic systolic heart failure, biventricular.  Recent diagnosis of systolic CHF in the setting of atrial fibrillation with RVR and longstanding moderate to severe MR.  LVEF 35 to 40% with global hypokinesis.  Reduced RV systolic function.  Moderate mitral regurgitation (patient not interested in surgical  correction), moderate pulmonary hypertension. --Remains euvolemic.  Continue Lasix.  AKI superimposed on CKD stage IIIa --AKI resolved.  Stage I sacral ulcer present on admission. Continue wound care. No Wound Therapy to show   Pressure Injury 09/28/19 Sacrum Medial Stage 1 -  Intact skin with non-blanchable redness of a localized area usually over a bony prominence. (Active)  Date First Assessed/Time First Assessed: 09/28/19 0630   Location: Sacrum  Location Orientation: Medial  Staging: Stage 1 -  Intact skin with non-blanchable redness of a localized area usually over a bony prominence.  Present on Admission: Yes    Assessments 09/28/2019  6:30 AM 10/05/2019  9:57 AM  Dressing Type Foam - Lift dressing to assess site every shift --  Dressing Clean;Dry;Intact Clean;Dry;Intact  Dressing Change Frequency PRN --  Site / Wound Assessment Clean;Dry;Pink --  Peri-wound Assessment Intact;Erythema (blanchable) --  Wound Length (cm) 5 cm --  Wound Width (cm) 4 cm --  Wound Depth (cm) 0 cm --  Wound Surface Area (cm^2) 20 cm^2 --  Wound Volume (cm^3) 0 cm^3 --  Tunneling (cm) 0 --  Undermining (cm) 0 --  Drainage Amount None --  Treatment Off loading --     No Linked orders to display     Consults:  . Cardiology    Procedures:  . Urinary catheter insertion  Today's assessment: S: no issues overnight per RN No complaints per patient. O: Vitals:  Vitals:   10/05/19 2004 10/06/19 0624  BP: (!) 101/58 (!) 99/57  Pulse: 83 97  Resp: 20 20  Temp: 98.8 F (37.1 C) 98.3 F (36.8 C)  SpO2: 99% 98%    Constitutional:  . Appears calm and comfortable Respiratory:  . CTA bilaterally, no w/r/r.  . Respiratory effort normal.  Cardiovascular:  . irregular, normal rate, no m/r/g . No LE extremity edema   . Telemetry afib, 70-80s Psychiatric:  . Mental status o Mood, affect appropriate  Repeat Covid negative  Discharge Instructions  Discharge Instructions    (HEART  FAILURE PATIENTS) Call MD:  Anytime you have any of the following symptoms: 1) 3 pound weight gain in 24 hours or 5 pounds in 1 week 2) shortness of breath, with or without a dry hacking cough 3) swelling in the hands, feet or stomach 4) if you have to sleep on extra pillows at night in order to breathe.   Complete by: As directed    Call MD for:  difficulty breathing, headache or visual disturbances   Complete by: As directed    Call MD for:  redness, tenderness, or signs of infection (pain, swelling, redness, odor or green/yellow discharge around incision site)   Complete by: As directed    Diet - low sodium heart healthy   Complete by: As directed    Discharge instructions   Complete by: As directed    Call your physician or seek immediate medical attention for shortness of breath, rapid heart rate, swelling, weight gain, bleeding or worsening of condition.   Discharge wound care:   Complete by: As directed    Turn frequently, cover with foam dressing   Increase activity slowly   Complete by: As directed      Allergies as of 10/06/2019      Reactions   Rocephin [ceftriaxone Sodium In Dextrose] Nausea And Vomiting, Other (See Comments)    Hallucinations Tolerated Keflex course 09/2019   Barbital Swelling,  Rash, Other (See Comments)   Blue lips and swollen tongue      Medication List    TAKE these medications   acetaminophen 500 MG tablet Commonly known as: TYLENOL Take 1,000 mg by mouth daily as needed for headache (pain).   amiodarone 200 MG tablet Commonly known as: PACERONE Take 1 tablet (200 mg total) by mouth 2 (two) times daily. What changed: when to take this   apixaban 5 MG Tabs tablet Commonly known as: ELIQUIS Take 1 tablet (5 mg total) by mouth 2 (two) times daily.   Artificial Tears 1.4 % ophthalmic solution Generic drug: polyvinyl alcohol Place 1 drop into both eyes daily as needed for dry eyes.   CALCIUM 500 +D PO Take 500 mg by mouth every evening.    cholecalciferol 25 MCG (1000 UNIT) tablet Commonly known as: VITAMIN D3 Take 1,000 Units by mouth every evening.   digoxin 0.125 MG tablet Commonly known as: LANOXIN Take 1 tablet (0.125 mg total) by mouth daily.   feeding supplement (KATE FARMS STANDARD 1.4) Liqd liquid Take 325 mLs by mouth daily.   finasteride 5 MG tablet Commonly known as: PROSCAR Take 5 mg by mouth daily.   fluticasone 50 MCG/ACT nasal spray Commonly known as: FLONASE Place 1-2 sprays into both nostrils daily as needed for allergies or rhinitis.   furosemide 20 MG tablet Commonly known as: LASIX Take 2 tablets (40 mg total) by mouth daily.   Krill Oil 500 MG Caps Take 500 mg by mouth every evening.   Lutein 20 MG Caps Take 20 mg by mouth every evening.   metoprolol tartrate 50 MG tablet Commonly known as: LOPRESSOR Take 1 tablet (50 mg total) by mouth 2 (two) times daily. What changed: medication strength   Ocuvite Eye Health Formula Caps Take 1 capsule by mouth every evening.   senna 8.6 MG Tabs tablet Commonly known as: SENOKOT Take 1 tablet by mouth every evening.   SUPER B COMPLEX PO Take 1 tablet by mouth every evening.   vitamin C 500 MG tablet Commonly known as: ASCORBIC ACID Take 500 mg by mouth every evening.            Discharge Care Instructions  (From admission, onward)         Start     Ordered   10/06/19 0000  Discharge wound care:       Comments: Turn frequently, cover with foam dressing   10/06/19 0904         Allergies  Allergen Reactions  . Rocephin [Ceftriaxone Sodium In Dextrose] Nausea And Vomiting and Other (See Comments)     Hallucinations  Tolerated Keflex course 09/2019  . Barbital Swelling, Rash and Other (See Comments)    Blue lips and swollen tongue    The results of significant diagnostics from this hospitalization (including imaging, microbiology, ancillary and laboratory) are listed below for reference.    Significant Diagnostic  Studies: DG Chest 2 View  Result Date: 10/04/2019 CLINICAL DATA:  86 year old male with history of tachycardia and hypoxia. EXAM: CHEST - 2 VIEW COMPARISON:  Chest x-ray 09/28/2019. FINDINGS: Lung volumes are low. No consolidative airspace disease. Small bilateral pleural effusions. No pneumothorax. No suspicious appearing pulmonary nodules or masses are noted. No evidence of pulmonary edema. Heart size is borderline enlarged. Upper mediastinal contours are distorted by patient's rotation to the left. Aortic atherosclerosis. IMPRESSION: 1. Small bilateral pleural effusions. 2. Aortic atherosclerosis. Electronically Signed   By: Mauri Brooklyn.D.  On: 10/04/2019 10:36   DG Chest Port 1 View  Result Date: 09/28/2019 CLINICAL DATA:  Hypotension EXAM: PORTABLE CHEST 1 VIEW COMPARISON:  September 22, 2019 FINDINGS: There is mild cardiomegaly. Aortic knob calcifications. Both lungs are clear. No focal airspace consolidation or pleural effusion. Advanced left shoulder osteoarthritis is seen with joint space loss. IMPRESSION: No active disease. Electronically Signed   By: Prudencio Pair M.D.   On: 09/28/2019 02:37   DG Chest Portable 1 View  Result Date: 09/22/2019 CLINICAL DATA:  Shortness of breath, decreased urine output EXAM: PORTABLE CHEST 1 VIEW COMPARISON:  04/16/2018 FINDINGS: Stable cardiomegaly without CHF or pneumonia. Negative for edema, effusion or pneumothorax. Trachea midline. Aorta atherosclerotic. IMPRESSION: Stable cardiomegaly without acute chest process or interval change. Aortic Atherosclerosis (ICD10-I70.0). Electronically Signed   By: Jerilynn Mages.  Shick M.D.   On: 09/22/2019 13:05   ECHOCARDIOGRAM COMPLETE  Result Date: 09/24/2019    ECHOCARDIOGRAM REPORT   Patient Name:   JAMALE SPANGLER Date of Exam: 09/24/2019 Medical Rec #:  [id number]      Height:       66.0 in Accession #:    [id number]     Weight:       177.0 lb Date of Birth:  Jul 03, 1922       BSA:          1.899 m Patient Age:    86 years        BP:           113/80 mmHg Patient Gender: M              HR:           97 bpm. Exam Location:  Inpatient Procedure: 2D Echo, Cardiac Doppler and Color Doppler Indications:    CHF-Acute Diastolic 283.15 / V76.16  History:        Patient has prior history of Echocardiogram examinations, most                 recent 04/17/2018. CHF, Arrythmias:Atrial Fibrillation and LBBB;                 Risk Factors:Dyslipidemia and Former Smoker. PSVT. MR. DOE.                 Elevated troponin.  Sonographer:    Vickie Epley RDCS Referring Phys: [place]  1. Left ventricular ejection fraction, by estimation, is 35 to 40%. The left ventricle has moderately decreased function. The left ventricle demonstrates global hypokinesis. Left ventricular diastolic parameters are consistent with Grade II diastolic dysfunction (pseudonormalization).  2. Right ventricular systolic function is moderately reduced. The right ventricular size is moderately enlarged. There is severely elevated pulmonary artery systolic pressure. The estimated right ventricular systolic pressure is 07.3 mmHg.  3. Left atrial size was severely dilated.  4. Right atrial size was severely dilated.  5. Suspect ruptured chorda(e) tendina(e) to the middle scallop of the posterior mitral leaflet. The severity of the mitral insufficiency jet may be underestimated due to its highly eccentric nature. The mitral valve is myxomatous. Moderate mitral valve regurgitation.  6. Tricuspid valve regurgitation is mild to moderate.  7. The aortic valve is tricuspid. Aortic valve regurgitation is mild to moderate.  8. The inferior vena cava is dilated in size with <50% respiratory variability, suggesting right atrial pressure of 15 mmHg. Comparison(s): Prior images reviewed side by side. The left ventricular function is significantly worse. FINDINGS  Left Ventricle: Left ventricular ejection fraction,  by estimation, is 35 to 40%. The left ventricle has moderately  decreased function. The left ventricle demonstrates global hypokinesis. The left ventricular internal cavity size was normal in size. There is no left ventricular hypertrophy. Left ventricular diastolic parameters are consistent with Grade II diastolic dysfunction (pseudonormalization). Normal left ventricular filling pressure. Right Ventricle: The right ventricular size is moderately enlarged. No increase in right ventricular wall thickness. Right ventricular systolic function is moderately reduced. There is severely elevated pulmonary artery systolic pressure. The tricuspid regurgitant velocity is 3.43 m/s, and with an assumed right atrial pressure of 15 mmHg, the estimated right ventricular systolic pressure is 94.7 mmHg. Left Atrium: Left atrial size was severely dilated. Right Atrium: Right atrial size was severely dilated. Pericardium: There is no evidence of pericardial effusion. Mitral Valve: Suspect ruptured chorda(e) tendina(e) to the middle scallop of the posterior mitral leaflet. The severity of the mitral insufficiency jet may be underestimated due to its highly eccentric nature. The mitral valve is myxomatous. Mild to moderate mitral annular calcification. Moderate mitral valve regurgitation, with anteriorly-directed jet. Tricuspid Valve: The tricuspid valve is normal in structure. Tricuspid valve regurgitation is mild to moderate. Aortic Valve: The aortic valve is tricuspid. . There is moderate thickening and mild calcification of the aortic valve. Aortic valve regurgitation is mild to moderate. Aortic regurgitation PHT measures 417 msec. There is moderate thickening of the aortic  valve. There is mild calcification of the aortic valve. Pulmonic Valve: The pulmonic valve was normal in structure. Pulmonic valve regurgitation is mild. Aorta: The aortic root is normal in size and structure. Venous: The inferior vena cava is dilated in size with less than 50% respiratory variability, suggesting right  atrial pressure of 15 mmHg. IAS/Shunts: No atrial level shunt detected by color flow Doppler.  LEFT VENTRICLE PLAX 2D LVIDd:         5.00 cm LVIDs:         4.50 cm LV PW:         1.10 cm LV IVS:        1.10 cm LVOT diam:     1.90 cm LV SV:         25 LV SV Index:   13 LVOT Area:     2.84 cm  LV Volumes (MOD) LV vol d, MOD A2C: 111.0 ml LV vol d, MOD A4C: 112.0 ml LV vol s, MOD A2C: 86.7 ml LV vol s, MOD A4C: 64.6 ml LV SV MOD A2C:     24.3 ml LV SV MOD A4C:     112.0 ml LV SV MOD BP:      38.1 ml RIGHT VENTRICLE TAPSE (M-mode): 0.8 cm LEFT ATRIUM             Index       RIGHT ATRIUM           Index LA diam:        5.90 cm 3.11 cm/m  RA Area:     20.30 cm LA Vol (A2C):   78.5 ml 41.34 ml/m RA Volume:   58.40 ml  30.76 ml/m LA Vol (A4C):   71.4 ml 37.60 ml/m LA Biplane Vol: 77.3 ml 40.71 ml/m  AORTIC VALVE LVOT Vmax:   56.40 cm/s LVOT Vmean:  40.600 cm/s LVOT VTI:    0.088 m AI PHT:      417 msec  AORTA Ao Root diam: 3.40 cm TRICUSPID VALVE TR Peak grad:   47.1 mmHg TR Vmax:  343.00 cm/s  SHUNTS Systemic VTI:  0.09 m Systemic Diam: 1.90 cm Sanda Klein MD Electronically signed by Sanda Klein MD Signature Date/Time: 09/24/2019/1:12:43 PM    Final    CT RENAL STONE STUDY  Result Date: 09/28/2019 CLINICAL DATA:  86 year old with inability to self catheterization. Question kidney stone versus intra-abdominal infection. Recent hospitalization for urinary tract infection. EXAM: CT ABDOMEN AND PELVIS WITHOUT CONTRAST TECHNIQUE: Multidetector CT imaging of the abdomen and pelvis was performed following the standard protocol without IV contrast. COMPARISON:  None. FINDINGS: Lower chest: Mild cardiomegaly. Small bilateral pleural effusions, left greater than right with adjacent compressive atelectasis. There are coronary artery calcifications. Hepatobiliary: No evidence of focal hepatic lesion. Possible stones or sludge in the gallbladder. No pericholecystic inflammation. There is no biliary dilatation.  Pancreas: No ductal dilatation or inflammation. Spleen: Normal in size.  No focal abnormality on noncontrast exam. Adrenals/Urinary Tract: No adrenal nodule. Moderate symmetric bilateral perinephric edema. No hydronephrosis. Nonobstructing 7 mm stone in the lower right kidney. There are bilateral simple cysts in both kidneys. Both ureters are decompressed without stones along the course. There are multiple, stones in the urinary bladder. Largest stone is in the region of the left urinary trigone measuring 8 mm. Additional calcifications in the midline may be bladder wall calcifications versus stones. Bladder is decompressed by Foley catheter. Diffuse bladder wall thickening with mild perivesicular fat stranding. Stomach/Bowel: Tiny hiatal hernia. The stomach is nondistended. There is no evidence of bowel obstruction. Normal appendix. Moderate colonic stool burden. Mild distal colonic diverticulosis without diverticulitis. Vascular/Lymphatic: Aorto bi-iliac atherosclerosis and tortuosity. No aneurysm. No bulky adenopathy. Reproductive: Prominent prostate spanning 4.3 cm transverse. Other: Generalized subcutaneous soft tissue edema involving the flanks and suprapubic soft tissues. Small amount of free fluid in the presacral region, pelvis, and bilateral upper quadrants. No free air. No evidence of focal abscess or fluid collection. Musculoskeletal: Diffuse multilevel degenerative change throughout the spine. There are no acute or suspicious osseous abnormalities. IMPRESSION: 1. Moderate symmetric bilateral perinephric edema, can be seen with urinary tract infection. 2. Nonobstructing 7 mm stone in the lower right kidney. 3. Urinary bladder decompressed by Foley catheter. There are stones in the bladder. There may also be bladder wall calcifications. 4. Generalized subcutaneous soft tissue edema consistent with third-spacing. Small amount of free fluid in the abdomen and pelvis. Small bilateral pleural effusions, left  greater than right with adjacent compressive atelectasis. 5. Possible stones or sludge in the gallbladder. No pericholecystic inflammation. 6. Colonic diverticulosis without diverticulitis. Aortic Atherosclerosis (ICD10-I70.0). Electronically Signed   By: Keith Rake M.D.   On: 09/28/2019 03:28    Microbiology: Recent Results (from the past 240 hour(s))  Urine C&S     Status: None   Collection Time: 09/27/19 11:56 PM   Specimen: Urine, Catheterized  Result Value Ref Range Status   Specimen Description   Final    URINE, CATHETERIZED Performed at [facility] [address], [place], [place] [address]    Special Requests NONE  Final   Culture   Final    NO GROWTH Performed at [facility], [place] [address], [place], [place] [address]    Report Status 09/29/2019 FINAL  Final  Culture, blood (single) w Reflex to ID Panel     Status: None   Collection Time: 09/28/19 12:40 AM   Specimen: BLOOD  Result Value Ref Range Status   Specimen Description   Final    BLOOD RIGHT ARM Performed at [facility]  [facility], [place] [address] [place], [place] [address]    Special Requests   Final    BOTTLES DRAWN AEROBIC AND ANAEROBIC Blood Culture results may not be optimal due to an inadequate volume of blood received in culture bottles Performed at [facility] [address], [place], [place] [address]    Culture   Final    NO GROWTH 5 DAYS Performed at [facility], [place] [address] [place], [place] [address]    Report Status 10/03/2019 FINAL  Final  SARS Coronavirus 2 by RT PCR (hospital order, performed in [facility] hospital lab) Nasopharyngeal Nasopharyngeal Swab     Status: None   Collection Time: 09/28/19  2:20 AM   Specimen: Nasopharyngeal Swab  Result Value Ref Range Status   SARS Coronavirus 2 NEGATIVE NEGATIVE Final    Comment: (NOTE) SARS-CoV-2 target nucleic acids are NOT DETECTED.  The SARS-CoV-2 RNA is generally  detectable in upper and lower respiratory specimens during the acute phase of infection. The lowest concentration of SARS-CoV-2 viral copies this assay can detect is 250 copies / mL. A negative result does not preclude SARS-CoV-2 infection and should not be used as the sole basis for treatment or other patient management decisions.  A negative result may occur with improper specimen collection / handling, submission of specimen other than nasopharyngeal swab, presence of viral mutation(s) within the areas targeted by this assay, and inadequate number of viral copies (<250 copies / mL). A negative result must be combined with clinical observations, patient history, and epidemiological information.  Fact Sheet for Patients:   StrictlyIdeas.no  Fact Sheet for Healthcare Providers: BankingDealers.co.za  This test is not yet approved or  cleared by the [place] FDA and has been authorized for detection and/or diagnosis of SARS-CoV-2 by FDA under an Emergency Use Authorization (EUA).  This EUA will remain in effect (meaning this test can be used) for the duration of the COVID-19 declaration under Section 564(b)(1) of the Act, 21 U.S.C. section 360bbb-3(b)(1), unless the authorization is terminated or revoked sooner.  Performed at [facility], [place] [address] [place], [place] [address]   MRSA PCR Screening     Status: None   Collection Time: 09/28/19  6:17 AM   Specimen: Nasal Mucosa; Nasopharyngeal  Result Value Ref Range Status   MRSA by PCR NEGATIVE NEGATIVE Final    Comment:        The GeneXpert MRSA Assay (FDA approved for NASAL specimens only), is one component of a comprehensive MRSA colonization surveillance program. It is not intended to diagnose MRSA infection nor to guide or monitor treatment for MRSA infections. Performed at [facility], [place] [address] [place], [place]  [address]   SARS Coronavirus 2 by RT PCR (hospital order, performed in [facility] hospital lab) Nasopharyngeal Nasopharyngeal Swab     Status: None   Collection Time: 10/02/19  4:59 PM   Specimen: Nasopharyngeal Swab  Result Value Ref Range Status   SARS Coronavirus 2 NEGATIVE NEGATIVE Final    Comment: (NOTE) SARS-CoV-2 target nucleic acids are NOT DETECTED.  The SARS-CoV-2 RNA is generally detectable in upper and lower respiratory specimens during the acute phase of infection. The lowest concentration of SARS-CoV-2 viral copies this assay can detect is 250 copies / mL. A negative result does not preclude SARS-CoV-2 infection and should not be used as the sole basis for treatment or other patient management decisions.  A negative result may occur with  improper specimen collection / handling, submission of specimen other than nasopharyngeal swab, presence of viral mutation(s) within the areas targeted by this assay, and inadequate number of viral copies (<250 copies / mL). A negative result must be combined with clinical observations, patient history, and epidemiological information.  Fact Sheet for Patients:   StrictlyIdeas.no  Fact Sheet for Healthcare Providers: BankingDealers.co.za  This test is not yet approved or  cleared by the [place] FDA and has been authorized for detection and/or diagnosis of SARS-CoV-2 by FDA under an Emergency Use Authorization (EUA).  This EUA will remain in effect (meaning this test can be used) for the duration of the COVID-19 declaration under Section 564(b)(1) of the Act, 21 U.S.C. section 360bbb-3(b)(1), unless the authorization is terminated or revoked sooner.  Performed at [facility], [place] [address], [place], [place] [address]   SARS Coronavirus 2 by RT PCR (hospital order, performed in [facility] hospital lab) Nasopharyngeal Nasopharyngeal Swab     Status: None    Collection Time: 10/05/19 12:21 PM   Specimen: Nasopharyngeal Swab  Result Value Ref Range Status   SARS Coronavirus 2 NEGATIVE NEGATIVE Final    Comment: (NOTE) SARS-CoV-2 target nucleic acids are NOT DETECTED.  The SARS-CoV-2 RNA is generally detectable in upper and lower respiratory specimens during the acute phase of infection. The lowest concentration of SARS-CoV-2 viral copies this assay can detect is 250 copies / mL. A negative result does not preclude SARS-CoV-2 infection and should not be used as the sole basis for treatment or other patient management decisions.  A negative result may occur with improper specimen collection / handling, submission of specimen other than nasopharyngeal swab, presence of viral mutation(s) within the areas targeted by this assay, and inadequate number of viral copies (<250 copies / mL). A negative result must be combined with clinical observations, patient history, and epidemiological information.  Fact Sheet for Patients:   StrictlyIdeas.no  Fact Sheet for Healthcare Providers: BankingDealers.co.za  This test is not yet approved or  cleared by the [place] FDA and has been authorized for detection and/or diagnosis of SARS-CoV-2 by FDA under an Emergency Use Authorization (EUA).  This EUA will remain in effect (meaning this test can be used) for the duration of the COVID-19 declaration under Section 564(b)(1) of the Act, 21 U.S.C. section 360bbb-3(b)(1), unless the authorization is terminated or revoked sooner.  Performed at [facility], [place] [address], [place], [place] [address]      Labs: Basic Metabolic Panel: Recent Labs  Lab 09/30/19 0302 10/01/19 0207 10/02/19 0440 10/04/19 0949 10/05/19 0458  NA 138 135 136 135 130*  K 3.1* 3.7 3.9 4.0 3.5  CL 97* 94* 92* 90* 90*  CO2 31 31 32 36* 31  GLUCOSE 106* 122* 104* 122* 93  BUN 29* 30* 29* 28* 27*   CREATININE 1.21 1.22 1.36* 1.26* 1.15  CALCIUM 8.0* 7.9* 7.9* 8.3* 7.6*  MG  --  2.0  --   --   --    Liver Function Tests: Recent Labs  Lab 10/04/19 0949  AST 33  ALT 34  ALKPHOS 145*  BILITOT 1.6*  PROT 6.5  ALBUMIN 2.5*   CBC: Recent Labs  Lab 09/30/19 0302 10/04/19 0949 10/05/19 0458  WBC 10.2 12.9* 11.1*  NEUTROABS 8.6*  --   --   HGB 11.9* 11.6* 10.5*  HCT 36.0* 34.6* 31.5*  MCV 99.7 100.3* 98.1  PLT 314 436* 412*    Recent Labs  09/22/19 1223 10/04/19 0949  BNP 1,552.3* 932.5*     Principal Problem:   Urinary retention Active Problems:   Chronic combined systolic and diastolic heart failure (HCC)   Neurogenic bladder   Atrial fibrillation with RVR (HCC)   CHF (congestive heart failure) (HCC)   Pressure injury of skin   Palliative care by specialist   Goals of care, counseling/discussion   General weakness   Time coordinating discharge: 35 minutes  Signed:  Murray Hodgkins, MD  [facility]  10/06/2019, 9:09 AM

## 2019-10-06 NOTE — Progress Notes (Addendum)
Progress Note   Subjective   Doing well today, the patient denies CP or SOB.  No new concerns  Inpatient Medications    Scheduled Meds: . amiodarone  400 mg Oral BID  . apixaban  5 mg Oral BID  . Chlorhexidine Gluconate Cloth  6 each Topical Q0600  . diclofenac Sodium  2 g Topical QID  . digoxin  0.125 mg Oral Daily  . feeding supplement (KATE FARMS STANDARD 1.4)  325 mL Oral Q24H  . finasteride  5 mg Oral Daily  . furosemide  40 mg Oral Daily  . mouth rinse  15 mL Mouth Rinse BID  . metoprolol tartrate  50 mg Oral BID  . senna  1 tablet Oral QPM   Continuous Infusions:  PRN Meds: acetaminophen **OR** acetaminophen, fluticasone, lip balm, polyvinyl alcohol   Vital Signs    Vitals:   10/05/19 1433 10/05/19 2004 10/06/19 0500 10/06/19 0624  BP: 101/60 (!) 101/58  (!) 99/57  Pulse: 68 83  97  Resp: 16 20  20   Temp: 98.3 F (36.8 C) 98.8 F (37.1 C)  98.3 F (36.8 C)  TempSrc: Oral Oral  Oral  SpO2: 99% 99%  98%  Weight:   79.6 kg   Height:        Intake/Output Summary (Last 24 hours) at 10/06/2019 4103 Last data filed at 10/06/2019 0617 Gross per 24 hour  Intake --  Output 1250 ml  Net -1250 ml   Filed Weights   10/05/19 0331 10/05/19 0500 10/06/19 0500  Weight: 82.7 kg 82.7 kg 79.6 kg    Telemetry    Afib, average rates below 100 bpm (mostly 80s) - Personally Reviewed  Physical Exam   GEN- The patient is elderly appearing, alert with daughter at bedside Head- normocephalic, atraumatic Eyes-  Sclera clear, conjunctiva pink Ears- hearing intact Oropharynx- clear Neck- supple, Lungs-  normal work of breathing Heart- irregular rate and rhythm  GI- soft  Extremities- no clubbing, cyanosis, or edema  MS- no significant deformity or atrophy Skin- no rash or lesion Psych- euthymic mood, full affect Neuro- strength and sensation are intact   Labs    Chemistry Recent Labs  Lab 10/02/19 0440 10/04/19 0949 10/05/19 0458  NA 136 135 130*  K  3.9 4.0 3.5  CL 92* 90* 90*  CO2 32 36* 31  GLUCOSE 104* 122* 93  BUN 29* 28* 27*  CREATININE 1.36* 1.26* 1.15  CALCIUM 7.9* 8.3* 7.6*  PROT  --  6.5  --   ALBUMIN  --  2.5*  --   AST  --  33  --   ALT  --  34  --   ALKPHOS  --  145*  --   BILITOT  --  1.6*  --   GFRNONAA 44* 48* 54*  GFRAA 51* 56* >60  ANIONGAP 12 9 9      Hematology Recent Labs  Lab 09/30/19 0302 10/04/19 0949 10/05/19 0458  WBC 10.2 12.9* 11.1*  RBC 3.61* 3.45* 3.21*  HGB 11.9* 11.6* 10.5*  HCT 36.0* 34.6* 31.5*  MCV 99.7 100.3* 98.1  MCH 33.0 33.6 32.7  MCHC 33.1 33.5 33.3  RDW 13.6 13.6 13.4  PLT 314 436* 412*     Patient ID  [id number] [email or web address] newly diagnosed systolic heart failure (LVEF 35%), left bundle branch block, urinary retention and neurogenic bladder admitted with urinary retention.Cardiology was consulted for management of atrial fibrillation.  Assessment & Plan    1.  afib Rate  controlled No changes at this time eliquis started 09/24/2019.  Will need follow-up in AF clinic in 1-2 weeks to arrange outpatient TEE guided cardioversion 3 weeks after initiation of eliquis  Reduce amiodarone to 200mg  BID at discharge.  2. Chronic systolic dysfunction Stable No change required today  Ok to discharge from a cardiology standpoint.  I will make sure he has afib clinic follow-up scheduled.  Thompson Grayer MD, [facility] 10/06/2019 8:07 AM

## 2019-10-07 ENCOUNTER — Telehealth (HOSPITAL_COMMUNITY): Payer: Self-pay | Admitting: Nurse Practitioner

## 2019-10-07 DIAGNOSIS — I509 Heart failure, unspecified: Secondary | ICD-10-CM | POA: Diagnosis not present

## 2019-10-07 DIAGNOSIS — I482 Chronic atrial fibrillation, unspecified: Secondary | ICD-10-CM | POA: Diagnosis not present

## 2019-10-07 DIAGNOSIS — R531 Weakness: Secondary | ICD-10-CM | POA: Diagnosis not present

## 2019-10-07 DIAGNOSIS — R339 Retention of urine, unspecified: Secondary | ICD-10-CM | POA: Diagnosis not present

## 2019-10-07 NOTE — Telephone Encounter (Signed)
Called and spoke with patient's daughter, [name] [facility] on file.  She states pt is in [place] and the family is going today to talk with them regarding outside appointments and other info.  [name] will call back after the meeting with facility to schedule her father's appt.  [facility] phone number was given to her and advised we normally see pts for hosp f/u in 7-10 days after discharge.

## 2019-10-08 ENCOUNTER — Ambulatory Visit: Payer: Medicare HMO | Admitting: Cardiovascular Disease

## 2019-10-17 ENCOUNTER — Ambulatory Visit (HOSPITAL_COMMUNITY)
Admission: RE | Admit: 2019-10-17 | Discharge: 2019-10-17 | Disposition: A | Discharge status: Home / Self Care | Payer: Medicare HMO | Source: Ambulatory Visit | Attending: Nurse Practitioner | Admitting: Nurse Practitioner

## 2019-10-17 ENCOUNTER — Encounter (HOSPITAL_COMMUNITY): Payer: Self-pay | Admitting: Nurse Practitioner

## 2019-10-17 ENCOUNTER — Other Ambulatory Visit: Payer: Self-pay

## 2019-10-17 VITALS — BP 130/68 | HR 70 | Ht 66.0 in | Wt 172.0 lb

## 2019-10-17 DIAGNOSIS — Z87891 Personal history of nicotine dependence: Secondary | ICD-10-CM | POA: Insufficient documentation

## 2019-10-17 DIAGNOSIS — Z79899 Other long term (current) drug therapy: Secondary | ICD-10-CM | POA: Insufficient documentation

## 2019-10-17 DIAGNOSIS — Z881 Allergy status to other antibiotic agents status: Secondary | ICD-10-CM | POA: Insufficient documentation

## 2019-10-17 DIAGNOSIS — I447 Left bundle-branch block, unspecified: Secondary | ICD-10-CM | POA: Diagnosis not present

## 2019-10-17 DIAGNOSIS — H353230 Exudative age-related macular degeneration, bilateral, stage unspecified: Secondary | ICD-10-CM | POA: Diagnosis not present

## 2019-10-17 DIAGNOSIS — I444 Left anterior fascicular block: Secondary | ICD-10-CM | POA: Insufficient documentation

## 2019-10-17 DIAGNOSIS — N319 Neuromuscular dysfunction of bladder, unspecified: Secondary | ICD-10-CM | POA: Diagnosis not present

## 2019-10-17 DIAGNOSIS — Z7901 Long term (current) use of anticoagulants: Secondary | ICD-10-CM | POA: Insufficient documentation

## 2019-10-17 DIAGNOSIS — D6869 Other thrombophilia: Secondary | ICD-10-CM | POA: Diagnosis not present

## 2019-10-17 DIAGNOSIS — M1711 Unilateral primary osteoarthritis, right knee: Secondary | ICD-10-CM | POA: Insufficient documentation

## 2019-10-17 DIAGNOSIS — Z87442 Personal history of urinary calculi: Secondary | ICD-10-CM | POA: Diagnosis not present

## 2019-10-17 DIAGNOSIS — I509 Heart failure, unspecified: Secondary | ICD-10-CM | POA: Insufficient documentation

## 2019-10-17 DIAGNOSIS — I471 Supraventricular tachycardia: Secondary | ICD-10-CM | POA: Insufficient documentation

## 2019-10-17 DIAGNOSIS — Z8719 Personal history of other diseases of the digestive system: Secondary | ICD-10-CM | POA: Diagnosis not present

## 2019-10-17 DIAGNOSIS — K219 Gastro-esophageal reflux disease without esophagitis: Secondary | ICD-10-CM | POA: Diagnosis not present

## 2019-10-17 DIAGNOSIS — I340 Nonrheumatic mitral (valve) insufficiency: Secondary | ICD-10-CM | POA: Diagnosis not present

## 2019-10-17 DIAGNOSIS — I4891 Unspecified atrial fibrillation: Secondary | ICD-10-CM | POA: Diagnosis not present

## 2019-10-18 NOTE — Progress Notes (Signed)
Primary Care Physician: [name], MD Referring Physician: Dr. Dewayne Hatch Gorsline is a 86 y.o. male with a h/o   GERD, moderate MR, self catherization for neurogenic bladder, LBBB,newly diagnosed CHF ( EF 35%), afib with RVR. Flecainide was stopped 2/2 new LV dysfunction, and pt was started on amiodarone load and eliquis 5 mg bid (6/15).   He is in the clinic today with daughter and son-in-law. Alert at beginning of clinic but then nodded off to sleep. Most of  the visit, history from  the family. The pt did state that he felt improved. Fluid status stable.  He had a nosebleed last night, somewhat chronic, and his eliquis was held. Therefore, the 3 week of uninterrupted anticoagulation will have to start over as of today. He was advised to see a ENT to see if a blood vessel in the nose needs cauterization.   Today, he denies symptoms of palpitations, chest pain, shortness of breath, orthopnea, PND, lower extremity edema, dizziness, presyncope, syncope, or neurologic sequela. The patient is tolerating medications without difficulties and is otherwise without complaint today.   Past Medical History:  Diagnosis Date  . Age-related macular degeneration, dry, left eye   . Age-related macular degeneration, wet, right eye ([place])   . Arthritis    "right knee" (04/17/2018)  . CHF (congestive heart failure) ([place])   . GERD (gastroesophageal reflux disease)   . Heart murmur   . History of duodenal ulcer   . History of hiatal hernia   . LAFB (left anterior fascicular block)   . Mitral valve disorder    Moderate MR  . Nephrolithiasis 2013  . Pneumonia 1938; ?date   "once as a child; once as an adult" (04/17/2018)  . PSVT (paroxysmal supraventricular tachycardia) ([place]) 06/2016   Past Surgical History:  Procedure Laterality Date  . CARDIAC CATHETERIZATION    . CATARACT EXTRACTION W/ INTRAOCULAR LENS  IMPLANT, BILATERAL Bilateral 2013  . SKIN BIOPSY Left    "thigh; suspected CA; it was  not"    Current Outpatient Medications  Medication Sig Dispense Refill  . acetaminophen (TYLENOL) 500 MG tablet Take 1,000 mg by mouth daily as needed for headache (pain).     Marland Kitchen amiodarone (PACERONE) 200 MG tablet Take 1 tablet (200 mg total) by mouth 2 (two) times daily.    Marland Kitchen apixaban (ELIQUIS) 5 MG TABS tablet Take 1 tablet (5 mg total) by mouth 2 (two) times daily. 60 tablet 0  . B Complex-C (SUPER B COMPLEX PO) Take 1 tablet by mouth every evening.    . Calcium Carb-Cholecalciferol (CALCIUM 500 +D PO) Take 500 mg by mouth every evening.    . cholecalciferol (VITAMIN D3) 25 MCG (1000 UNIT) tablet Take 1,000 Units by mouth every evening.     . digoxin (LANOXIN) 0.125 MG tablet Take 1 tablet (0.125 mg total) by mouth daily.    . finasteride (PROSCAR) 5 MG tablet Take 5 mg by mouth daily.    . fluticasone (FLONASE) 50 MCG/ACT nasal spray Place 1-2 sprays into both nostrils daily as needed for allergies or rhinitis.    . furosemide (LASIX) 20 MG tablet Take 2 tablets (40 mg total) by mouth daily. 60 tablet 0  . Krill Oil 500 MG CAPS Take 500 mg by mouth every evening.    . Lutein 20 MG CAPS Take 20 mg by mouth every evening.     . metoprolol tartrate (LOPRESSOR) 50 MG tablet Take 1 tablet (50 mg  total) by mouth 2 (two) times daily.    . Multiple Vitamins-Minerals (OCUVITE EYE HEALTH FORMULA) CAPS Take 1 capsule by mouth every evening.     . Nutritional Supplements (FEEDING SUPPLEMENT, KATE FARMS STANDARD 1.4,) LIQD liquid Take 325 mLs by mouth daily.    . polyvinyl alcohol (ARTIFICIAL TEARS) 1.4 % ophthalmic solution Place 1 drop into both eyes daily as needed for dry eyes.    Marland Kitchen senna (SENOKOT) 8.6 MG TABS tablet Take 1 tablet by mouth every evening.    . vitamin C (ASCORBIC ACID) 500 MG tablet Take 500 mg by mouth every evening.      No current facility-administered medications for this encounter.    Allergies  Allergen Reactions  . Rocephin [Ceftriaxone Sodium In Dextrose] Nausea And  Vomiting and Other (See Comments)     Hallucinations  Tolerated Keflex course 09/2019  . Barbital Swelling, Rash and Other (See Comments)    Blue lips and swollen tongue    Social History   Socioeconomic History  . Marital status: Widowed    Spouse name: Not on file  . Number of children: Not on file  . Years of education: Not on file  . Highest education level: Not on file  Occupational History  . Not on file  Tobacco Use  . Smoking status: Former Research scientist (life sciences)  . Smokeless tobacco: Never Used  . Tobacco comment: 04/17/2018 "quit smoking when I was 14"  Vaping Use  . Vaping Use: Never used  Substance and Sexual Activity  . Alcohol use: Yes    Alcohol/week: 3.0 standard drinks    Types: 3 Glasses of wine per week  . Drug use: Never  . Sexual activity: Not on file  Other Topics Concern  . Not on file  Social History Narrative  . Not on file   Social Determinants of Health   Financial Resource Strain:   . Difficulty of Paying Living Expenses:   Food Insecurity:   . Worried About Charity fundraiser in the Last Year:   . Arboriculturist in the Last Year:   Transportation Needs:   . Film/video editor (Medical):   Marland Kitchen Lack of Transportation (Non-Medical):   Physical Activity:   . Days of Exercise per Week:   . Minutes of Exercise per Session:   Stress:   . Feeling of Stress :   Social Connections:   . Frequency of Communication with Friends and Family:   . Frequency of Social Gatherings with Friends and Family:   . Attends Religious Services:   . Active Member of Clubs or Organizations:   . Attends Archivist Meetings:   Marland Kitchen Marital Status:   Intimate Partner Violence:   . Fear of Current or Ex-Partner:   . Emotionally Abused:   Marland Kitchen Physically Abused:   . Sexually Abused:     Family History  Problem Relation Age of Onset  . Cancer Mother   . Pneumonia Father     ROS- All systems are reviewed and negative except as per the HPI above  Physical  Exam: Vitals:   10/17/19 1055  BP: 130/68  Pulse: 70  Weight: 78 kg  Height: 5\' 6"  (1.676 m)   Wt Readings from Last 3 Encounters:  10/17/19 78 kg  10/06/19 79.6 kg  09/26/19 78.8 kg    Labs: Lab Results  Component Value Date   NA 130 (L) 10/05/2019   K 3.5 10/05/2019   CL 90 (L) 10/05/2019  CO2 31 10/05/2019   GLUCOSE 93 10/05/2019   BUN 27 (H) 10/05/2019   CREATININE 1.15 10/05/2019   CALCIUM 7.6 (L) 10/05/2019   MG 2.0 10/01/2019   No results found for: INR Lab Results  Component Value Date   CHOL 141 04/17/2018   HDL 53 04/17/2018   LDLCALC 80 04/17/2018   TRIG 41 04/17/2018     GEN- The patient is well appearing, alert and oriented x 3 today.   Head- normocephalic, atraumatic Eyes-  Sclera clear, conjunctiva pink Ears- hearing intact Oropharynx- clear Neck- supple, no JVP Lymph- no cervical lymphadenopathy Lungs- Clear to ausculation bilaterally, normal work of breathing Heart- irregular rate and rhythm, no murmurs, rubs or gallops, PMI not laterally displaced GI- soft, NT, ND, + BS Extremities- no clubbing, cyanosis, or edema MS- no significant deformity or atrophy Skin- no rash or lesion Psych- euthymic mood, full affect Neuro- strength and sensation are intact  EKG-afib at 70 bpm, qrs 136 ms, qtc 483 ms    Assessment and Plan: 1. Afib Flecainide  recently stopped and loaded on amiodarone  Continue 200 mg bid  He is rate controlled  Continue metoprolol 50 mg bid  Continue digoxin 0.125 mg daily    2. CHA2DS2VASc score of 3 Continue eliquis 5 mg bid without interruption  Will plan on cardioversion after 3 weeks of uninterrupted cardioversion  His first day going forward is today 7/8 as he missed a dose last night  Recommend  seeing a ENT for nosebleeds    3. CHF Newly seen LV dysfunction Probably tachycardia mediated  He is rate controlled today Continue  lasix   F/u in 2 weeks    Butch Penny C. Lydiann Bonifas, [facility] [address] [place], [place] [address] [phone]

## 2019-10-22 DIAGNOSIS — R339 Retention of urine, unspecified: Secondary | ICD-10-CM | POA: Diagnosis not present

## 2019-10-22 DIAGNOSIS — N138 Other obstructive and reflux uropathy: Secondary | ICD-10-CM | POA: Diagnosis not present

## 2019-10-22 DIAGNOSIS — N401 Enlarged prostate with lower urinary tract symptoms: Secondary | ICD-10-CM | POA: Diagnosis not present

## 2019-10-24 DIAGNOSIS — G8929 Other chronic pain: Secondary | ICD-10-CM | POA: Diagnosis not present

## 2019-10-24 DIAGNOSIS — Z7409 Other reduced mobility: Secondary | ICD-10-CM | POA: Diagnosis not present

## 2019-10-24 DIAGNOSIS — M25561 Pain in right knee: Secondary | ICD-10-CM | POA: Diagnosis not present

## 2019-10-24 DIAGNOSIS — M170 Bilateral primary osteoarthritis of knee: Secondary | ICD-10-CM | POA: Diagnosis not present

## 2019-10-24 DIAGNOSIS — M25562 Pain in left knee: Secondary | ICD-10-CM | POA: Diagnosis not present

## 2019-10-24 DIAGNOSIS — Z789 Other specified health status: Secondary | ICD-10-CM | POA: Diagnosis not present

## 2019-10-28 ENCOUNTER — Encounter: Payer: Self-pay | Admitting: General Practice

## 2019-10-29 DIAGNOSIS — F4323 Adjustment disorder with mixed anxiety and depressed mood: Secondary | ICD-10-CM | POA: Diagnosis not present

## 2019-10-29 DIAGNOSIS — G4700 Insomnia, unspecified: Secondary | ICD-10-CM | POA: Diagnosis not present

## 2019-10-31 ENCOUNTER — Other Ambulatory Visit: Payer: Self-pay

## 2019-10-31 ENCOUNTER — Encounter (HOSPITAL_COMMUNITY): Payer: Self-pay | Admitting: Nurse Practitioner

## 2019-10-31 ENCOUNTER — Ambulatory Visit (HOSPITAL_COMMUNITY): Payer: Medicare HMO | Admitting: Nurse Practitioner

## 2019-10-31 ENCOUNTER — Ambulatory Visit (HOSPITAL_COMMUNITY)
Admission: RE | Admit: 2019-10-31 | Discharge: 2019-10-31 | Disposition: A | Discharge status: Home / Self Care | Payer: Medicare HMO | Source: Ambulatory Visit | Attending: Nurse Practitioner | Admitting: Nurse Practitioner

## 2019-10-31 VITALS — BP 104/62 | HR 65 | Ht 65.0 in | Wt 171.0 lb

## 2019-10-31 DIAGNOSIS — D6869 Other thrombophilia: Secondary | ICD-10-CM | POA: Diagnosis not present

## 2019-10-31 DIAGNOSIS — Z87891 Personal history of nicotine dependence: Secondary | ICD-10-CM | POA: Insufficient documentation

## 2019-10-31 DIAGNOSIS — Z7901 Long term (current) use of anticoagulants: Secondary | ICD-10-CM | POA: Insufficient documentation

## 2019-10-31 DIAGNOSIS — I447 Left bundle-branch block, unspecified: Secondary | ICD-10-CM | POA: Insufficient documentation

## 2019-10-31 DIAGNOSIS — I4891 Unspecified atrial fibrillation: Secondary | ICD-10-CM | POA: Diagnosis not present

## 2019-10-31 DIAGNOSIS — Z79899 Other long term (current) drug therapy: Secondary | ICD-10-CM | POA: Diagnosis not present

## 2019-10-31 DIAGNOSIS — I509 Heart failure, unspecified: Secondary | ICD-10-CM | POA: Diagnosis not present

## 2019-10-31 DIAGNOSIS — R011 Cardiac murmur, unspecified: Secondary | ICD-10-CM | POA: Insufficient documentation

## 2019-10-31 NOTE — Progress Notes (Signed)
Primary Care Physician: [name], MD Referring Physician: Dr. Dewayne Hatch Travis Kelly is a 86 y.o. male with a h/o   GERD, moderate MR, self catherization for neurogenic bladder, LBBB,newly diagnosed CHF ( EF 35%), afib with RVR. Flecainide was stopped 2/2 new LV dysfunction, and pt was started on amiodarone load and eliquis 5 mg bid (6/15).   He is in the clinic today with daughter and son-in-law. Alert at beginning of clinic but then nodded off to sleep. Most of  the visit, history from  the family. The pt did state that he felt improved. Fluid status stable.  He had a nosebleed last night, somewhat chronic, and his eliquis was held. Therefore, the 3 week of uninterrupted anticoagulation will have to start over as of today. He was advised to see a ENT to see if a blood vessel in the nose needs cauterization.   F/u in the afib clinic, 7/22. Ekg shows afib rate controlled. No missed anticoagulation. He is getting weaker and cannot transfer, ambulate. His 2 daughters and his son-in-law are  with him today. He is going to a Skilled facility next week as he now can not stand on his own. The S-I L is  wanting to either leave him in rate controlled afib or delay cardioversion for a few weeks to get into the SNF for some PT to see if he can get some strengtht back.    Today, he denies symptoms of palpitations, chest pain, shortness of breath, orthopnea, PND, lower extremity edema, dizziness, presyncope, syncope, or neurologic sequela. The patient is tolerating medications without difficulties and is otherwise without complaint today.   Past Medical History:  Diagnosis Date  . Age-related macular degeneration, dry, left eye   . Age-related macular degeneration, wet, right eye ([place])   . Arthritis    "right knee" (04/17/2018)  . CHF (congestive heart failure) ([place])   . GERD (gastroesophageal reflux disease)   . Heart murmur   . History of duodenal ulcer   . History of hiatal hernia   .  LAFB (left anterior fascicular block)   . Mitral valve disorder    Moderate MR  . Nephrolithiasis 2013  . Pneumonia 1938; ?date   "once as a child; once as an adult" (04/17/2018)  . PSVT (paroxysmal supraventricular tachycardia) ([place]) 06/2016   Past Surgical History:  Procedure Laterality Date  . CARDIAC CATHETERIZATION    . CATARACT EXTRACTION W/ INTRAOCULAR LENS  IMPLANT, BILATERAL Bilateral 2013  . SKIN BIOPSY Left    "thigh; suspected CA; it was not"    Current Outpatient Medications  Medication Sig Dispense Refill  . acetaminophen (TYLENOL) 500 MG tablet Take 1,000 mg by mouth daily as needed for headache (pain).     Marland Kitchen amiodarone (PACERONE) 200 MG tablet Take 1 tablet (200 mg total) by mouth 2 (two) times daily.    Marland Kitchen apixaban (ELIQUIS) 5 MG TABS tablet Take 1 tablet (5 mg total) by mouth 2 (two) times daily. 60 tablet 0  . B Complex-C (SUPER B COMPLEX PO) Take 1 tablet by mouth every evening.    . Calcium Carb-Cholecalciferol (CALCIUM 500 +D PO) Take 500 mg by mouth every evening.    . cholecalciferol (VITAMIN D3) 25 MCG (1000 UNIT) tablet Take 1,000 Units by mouth every evening.     . digoxin (LANOXIN) 0.125 MG tablet Take 1 tablet (0.125 mg total) by mouth daily.    . finasteride (PROSCAR) 5 MG tablet Take 5 mg  by mouth daily.    . fluticasone (FLONASE) 50 MCG/ACT nasal spray Place 1-2 sprays into both nostrils daily as needed for allergies or rhinitis.    . furosemide (LASIX) 20 MG tablet Take 2 tablets (40 mg total) by mouth daily. 60 tablet 0  . Krill Oil 500 MG CAPS Take 500 mg by mouth every evening.    . Lutein 20 MG CAPS Take 20 mg by mouth every evening.     . metoprolol tartrate (LOPRESSOR) 50 MG tablet Take 1 tablet (50 mg total) by mouth 2 (two) times daily.    . Multiple Vitamins-Minerals (OCUVITE EYE HEALTH FORMULA) CAPS Take 1 capsule by mouth every evening.     . Nutritional Supplements (FEEDING SUPPLEMENT, KATE FARMS STANDARD 1.4,) LIQD liquid Take 325 mLs by  mouth daily.    . polyvinyl alcohol (ARTIFICIAL TEARS) 1.4 % ophthalmic solution Place 1 drop into both eyes daily as needed for dry eyes.    Marland Kitchen senna (SENOKOT) 8.6 MG TABS tablet Take 1 tablet by mouth every evening.    . vitamin C (ASCORBIC ACID) 500 MG tablet Take 500 mg by mouth every evening.      No current facility-administered medications for this encounter.    Allergies  Allergen Reactions  . Rocephin [Ceftriaxone Sodium In Dextrose] Nausea And Vomiting and Other (See Comments)     Hallucinations  Tolerated Keflex course 09/2019  . Barbital Swelling, Rash and Other (See Comments)    Blue lips and swollen tongue    Social History   Socioeconomic History  . Marital status: Widowed    Spouse name: Not on file  . Number of children: Not on file  . Years of education: Not on file  . Highest education level: Not on file  Occupational History  . Not on file  Tobacco Use  . Smoking status: Former Research scientist (life sciences)  . Smokeless tobacco: Never Used  . Tobacco comment: 04/17/2018 "quit smoking when I was 14"  Vaping Use  . Vaping Use: Never used  Substance and Sexual Activity  . Alcohol use: Yes    Alcohol/week: 3.0 standard drinks    Types: 3 Glasses of wine per week  . Drug use: Never  . Sexual activity: Not on file  Other Topics Concern  . Not on file  Social History Narrative  . Not on file   Social Determinants of Health   Financial Resource Strain:   . Difficulty of Paying Living Expenses:   Food Insecurity:   . Worried About Charity fundraiser in the Last Year:   . Arboriculturist in the Last Year:   Transportation Needs:   . Film/video editor (Medical):   Marland Kitchen Lack of Transportation (Non-Medical):   Physical Activity:   . Days of Exercise per Week:   . Minutes of Exercise per Session:   Stress:   . Feeling of Stress :   Social Connections:   . Frequency of Communication with Friends and Family:   . Frequency of Social Gatherings with Friends and Family:   .  Attends Religious Services:   . Active Member of Clubs or Organizations:   . Attends Archivist Meetings:   Marland Kitchen Marital Status:   Intimate Partner Violence:   . Fear of Current or Ex-Partner:   . Emotionally Abused:   Marland Kitchen Physically Abused:   . Sexually Abused:     Family History  Problem Relation Age of Onset  . Cancer Mother   .  Pneumonia Father     ROS- All systems are reviewed and negative except as per the HPI above  Physical Exam: There were no vitals filed for this visit. Wt Readings from Last 3 Encounters:  10/17/19 78 kg  10/06/19 79.6 kg  09/26/19 78.8 kg    Labs: Lab Results  Component Value Date   NA 130 (L) 10/05/2019   K 3.5 10/05/2019   CL 90 (L) 10/05/2019   CO2 31 10/05/2019   GLUCOSE 93 10/05/2019   BUN 27 (H) 10/05/2019   CREATININE 1.15 10/05/2019   CALCIUM 7.6 (L) 10/05/2019   MG 2.0 10/01/2019   No results found for: INR Lab Results  Component Value Date   CHOL 141 04/17/2018   HDL 53 04/17/2018   LDLCALC 80 04/17/2018   TRIG 41 04/17/2018     GEN- The patient is well appearing, alert and oriented x 3 today.   Head- normocephalic, atraumatic Eyes-  Sclera clear, conjunctiva pink Ears- hearing intact Oropharynx- clear Neck- supple, no JVP Lymph- no cervical lymphadenopathy Lungs- Clear to ausculation bilaterally, normal work of breathing Heart- irregular rate and rhythm, no murmurs, rubs or gallops, PMI not laterally displaced GI- soft, NT, ND, + BS Extremities- no clubbing, cyanosis, or edema MS- no significant deformity or atrophy Skin- no rash or lesion Psych- euthymic mood, full affect Neuro- strength and sensation are intact  EKG-afib at  65  bpm, qrs 136 ms, qtc 453 ms    Assessment and Plan: 1. Afib Flecainide  recently stopped and loaded on amiodarone  Continue 200 mg bid for now  He is rate controlled  Continue metoprolol 50 mg bid  Continue digoxin 0.125 mg daily  He is know eligible for CV after 7/29, no  further missed does of anticoagulation  S-I-L wants to hold off on cardioversion, he knows Dr. Rayann Heman and plans to discuss with him personally  He wants to see if he can get stronger at SNF first cardiovert in a few weeks vrs just leaving the pt in rate controlled afib with amio for rate control   If that is the case,amiodarone should be reduced to 200 mg daily if CV is not in the future Will wait and see what the family wants to do after their   discussion with Dr. Rayann Heman  2. CHA2DS2VASc score of 3 Continue eliquis 5 mg bid without interruption  No further nosebleeds   Pending ENT appointment  for nosebleeds    3. CHF Newly seen LV dysfunction Probably tachycardia mediated  He is rate controlled today Continue  Lasix/ted hose   F/u planning above, S-I-L will notify us per their decision    Butch Penny C. Szymon Foiles, [facility] [address] [place],  [address] [phone]

## 2019-11-04 DIAGNOSIS — I509 Heart failure, unspecified: Secondary | ICD-10-CM | POA: Diagnosis not present

## 2019-11-04 DIAGNOSIS — R0602 Shortness of breath: Secondary | ICD-10-CM | POA: Diagnosis not present

## 2019-11-04 DIAGNOSIS — R509 Fever, unspecified: Secondary | ICD-10-CM | POA: Diagnosis not present

## 2019-11-08 ENCOUNTER — Encounter (HOSPITAL_COMMUNITY): Payer: Self-pay | Admitting: Nurse Practitioner

## 2019-11-08 NOTE — Addendum Note (Signed)
Encounter addended by: Sherran Needs, NP on: 11/08/2019 8:27 AM  Actions taken: Allergies reviewed, Medication List reviewed, Problem List reviewed, Level of Service modified

## 2019-11-10 DIAGNOSIS — K5730 Diverticulosis of large intestine without perforation or abscess without bleeding: Secondary | ICD-10-CM | POA: Diagnosis not present

## 2019-11-10 DIAGNOSIS — N1832 Chronic kidney disease, stage 3b: Secondary | ICD-10-CM | POA: Diagnosis not present

## 2019-11-10 DIAGNOSIS — Z9181 History of falling: Secondary | ICD-10-CM | POA: Diagnosis not present

## 2019-11-10 DIAGNOSIS — I272 Pulmonary hypertension, unspecified: Secondary | ICD-10-CM | POA: Diagnosis not present

## 2019-11-10 DIAGNOSIS — I5042 Chronic combined systolic (congestive) and diastolic (congestive) heart failure: Secondary | ICD-10-CM | POA: Diagnosis not present

## 2019-11-10 DIAGNOSIS — I4819 Other persistent atrial fibrillation: Secondary | ICD-10-CM | POA: Diagnosis not present

## 2019-11-10 DIAGNOSIS — E785 Hyperlipidemia, unspecified: Secondary | ICD-10-CM | POA: Diagnosis not present

## 2019-11-11 DIAGNOSIS — I5042 Chronic combined systolic (congestive) and diastolic (congestive) heart failure: Secondary | ICD-10-CM | POA: Diagnosis not present

## 2019-11-11 DIAGNOSIS — N1831 Chronic kidney disease, stage 3a: Secondary | ICD-10-CM | POA: Diagnosis not present

## 2019-11-11 DIAGNOSIS — R338 Other retention of urine: Secondary | ICD-10-CM | POA: Diagnosis not present

## 2019-11-11 DIAGNOSIS — E860 Dehydration: Secondary | ICD-10-CM | POA: Diagnosis not present

## 2019-11-11 DIAGNOSIS — I4819 Other persistent atrial fibrillation: Secondary | ICD-10-CM | POA: Diagnosis not present

## 2019-11-11 DIAGNOSIS — R0602 Shortness of breath: Secondary | ICD-10-CM | POA: Diagnosis not present

## 2019-11-11 DIAGNOSIS — N319 Neuromuscular dysfunction of bladder, unspecified: Secondary | ICD-10-CM | POA: Diagnosis not present

## 2019-11-11 DIAGNOSIS — R600 Localized edema: Secondary | ICD-10-CM | POA: Diagnosis not present

## 2019-11-12 DIAGNOSIS — Z7189 Other specified counseling: Secondary | ICD-10-CM | POA: Diagnosis not present

## 2019-11-14 DIAGNOSIS — R627 Adult failure to thrive: Secondary | ICD-10-CM | POA: Diagnosis not present

## 2019-11-14 DIAGNOSIS — I509 Heart failure, unspecified: Secondary | ICD-10-CM | POA: Diagnosis not present

## 2019-11-14 DIAGNOSIS — R339 Retention of urine, unspecified: Secondary | ICD-10-CM | POA: Diagnosis not present

## 2019-11-14 DIAGNOSIS — I482 Chronic atrial fibrillation, unspecified: Secondary | ICD-10-CM | POA: Diagnosis not present

## 2020-07-15 IMAGING — DX DG KNEE 1-2V PORT*R*
2 series · 2 of 2 positions shown · non-contrast
Comparison: None.

CLINICAL DATA: Right knee pain.

EXAM:
PORTABLE RIGHT KNEE - 1-2 VIEW

[knee ap]
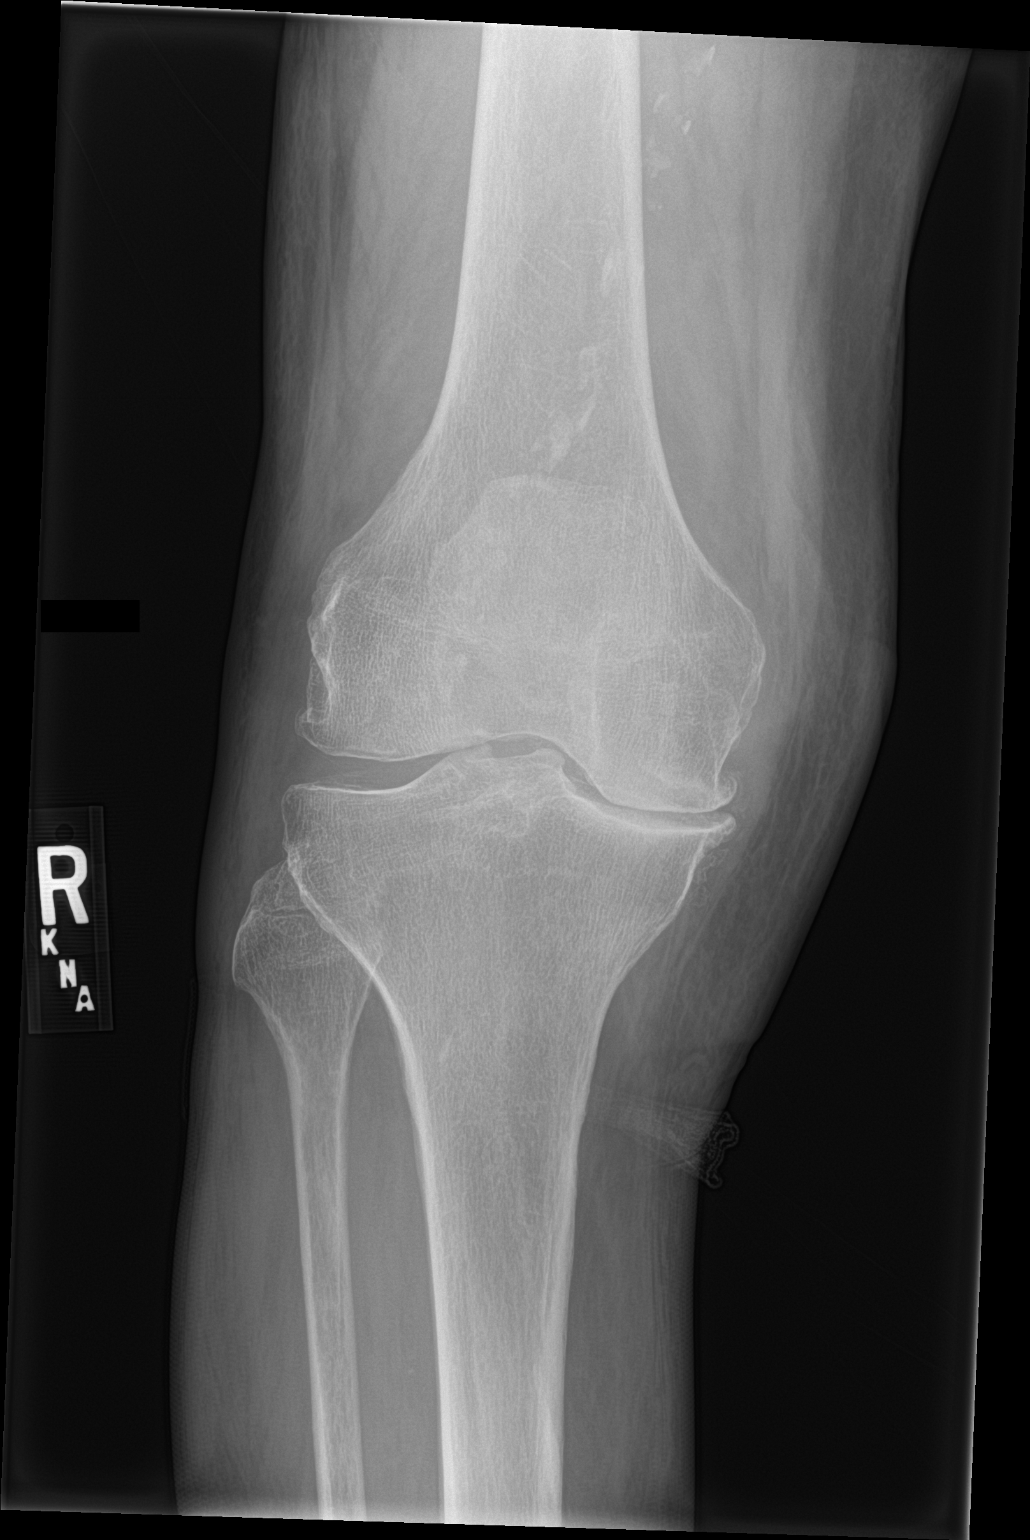

[knee lat]
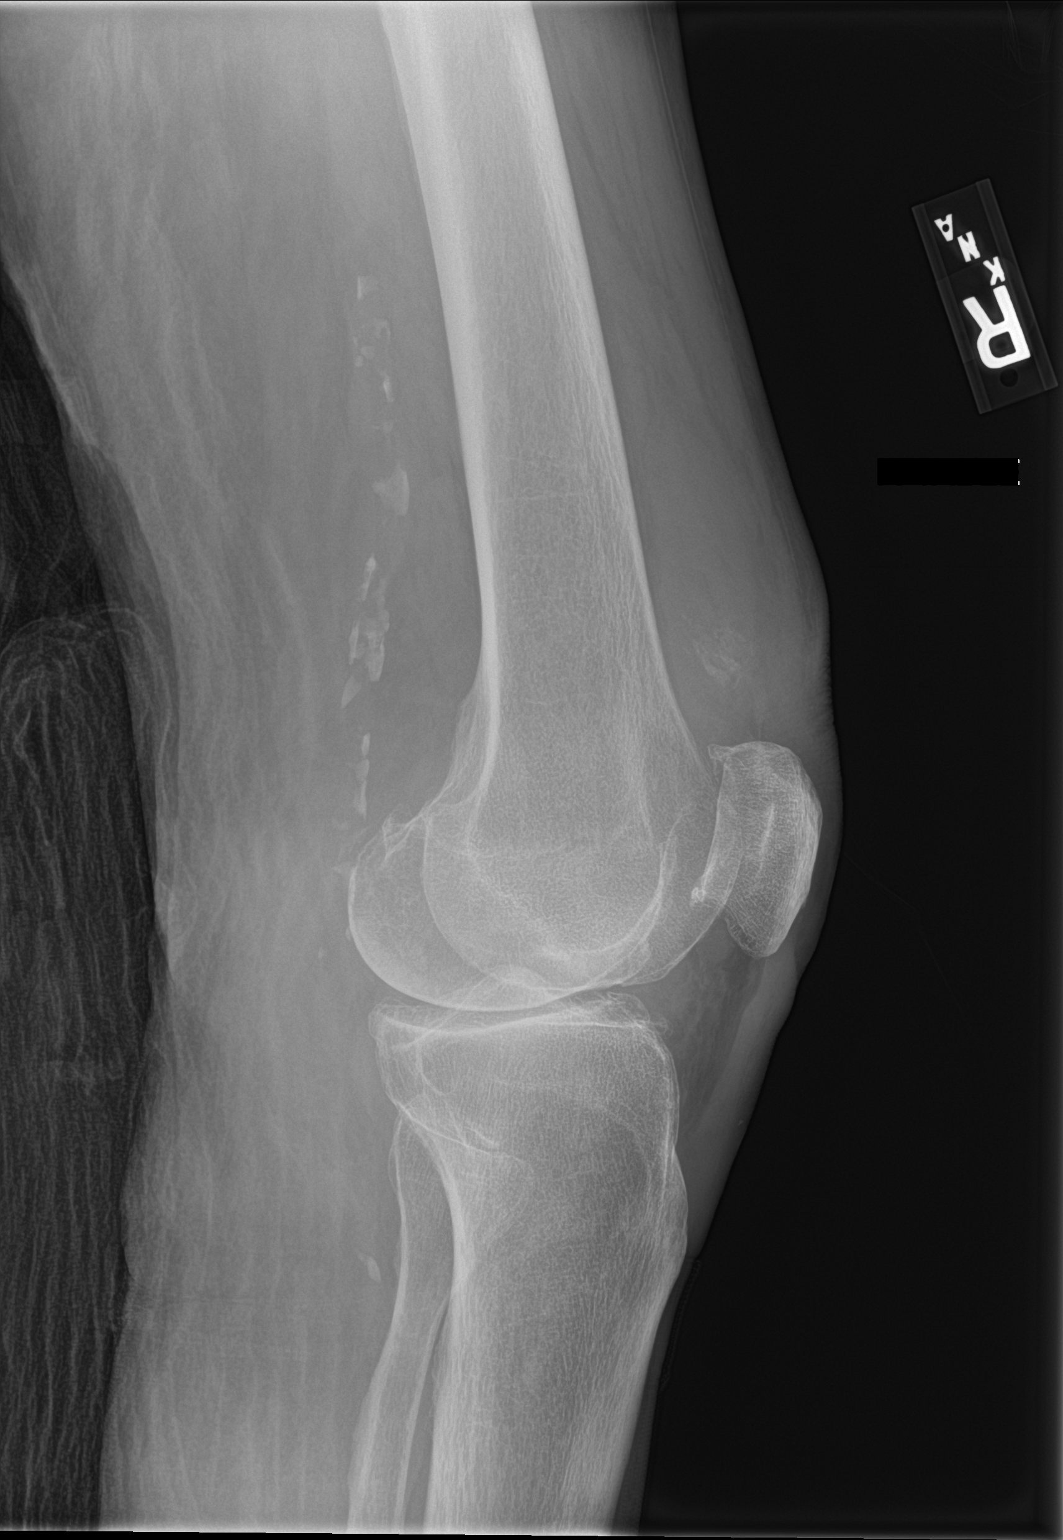

[2 of 2 positions shown; findings below may reference images not displayed]

FINDINGS: There is medial joint space narrowing with moderate marginal
osteophyte formation in the medial and patellofemoral compartments.
There is a joint effusion with soft tissue swelling in the region of
the distal quadriceps tendon. There is also calcification in the
suprapatellar recess of the joint.

Arterial vascular calcifications around the knee.
IMPRESSION: Moderate osteoarthritis of the right knee with a joint effusion and
soft tissue swelling in the region of the distal quadriceps tendon.

## 2020-07-17 IMAGING — DX DG ABD PORTABLE 1V
1 series · 2 of 2 positions shown · non-contrast
Comparison: None.

CLINICAL DATA: [AGE]/o  M; nausea and vomiting tonight.

EXAM:
PORTABLE ABDOMEN - 1 VIEW

[Series 1: abdomen · 0.14mm/px · 2 of 2 slices shown]
[im 1/2]
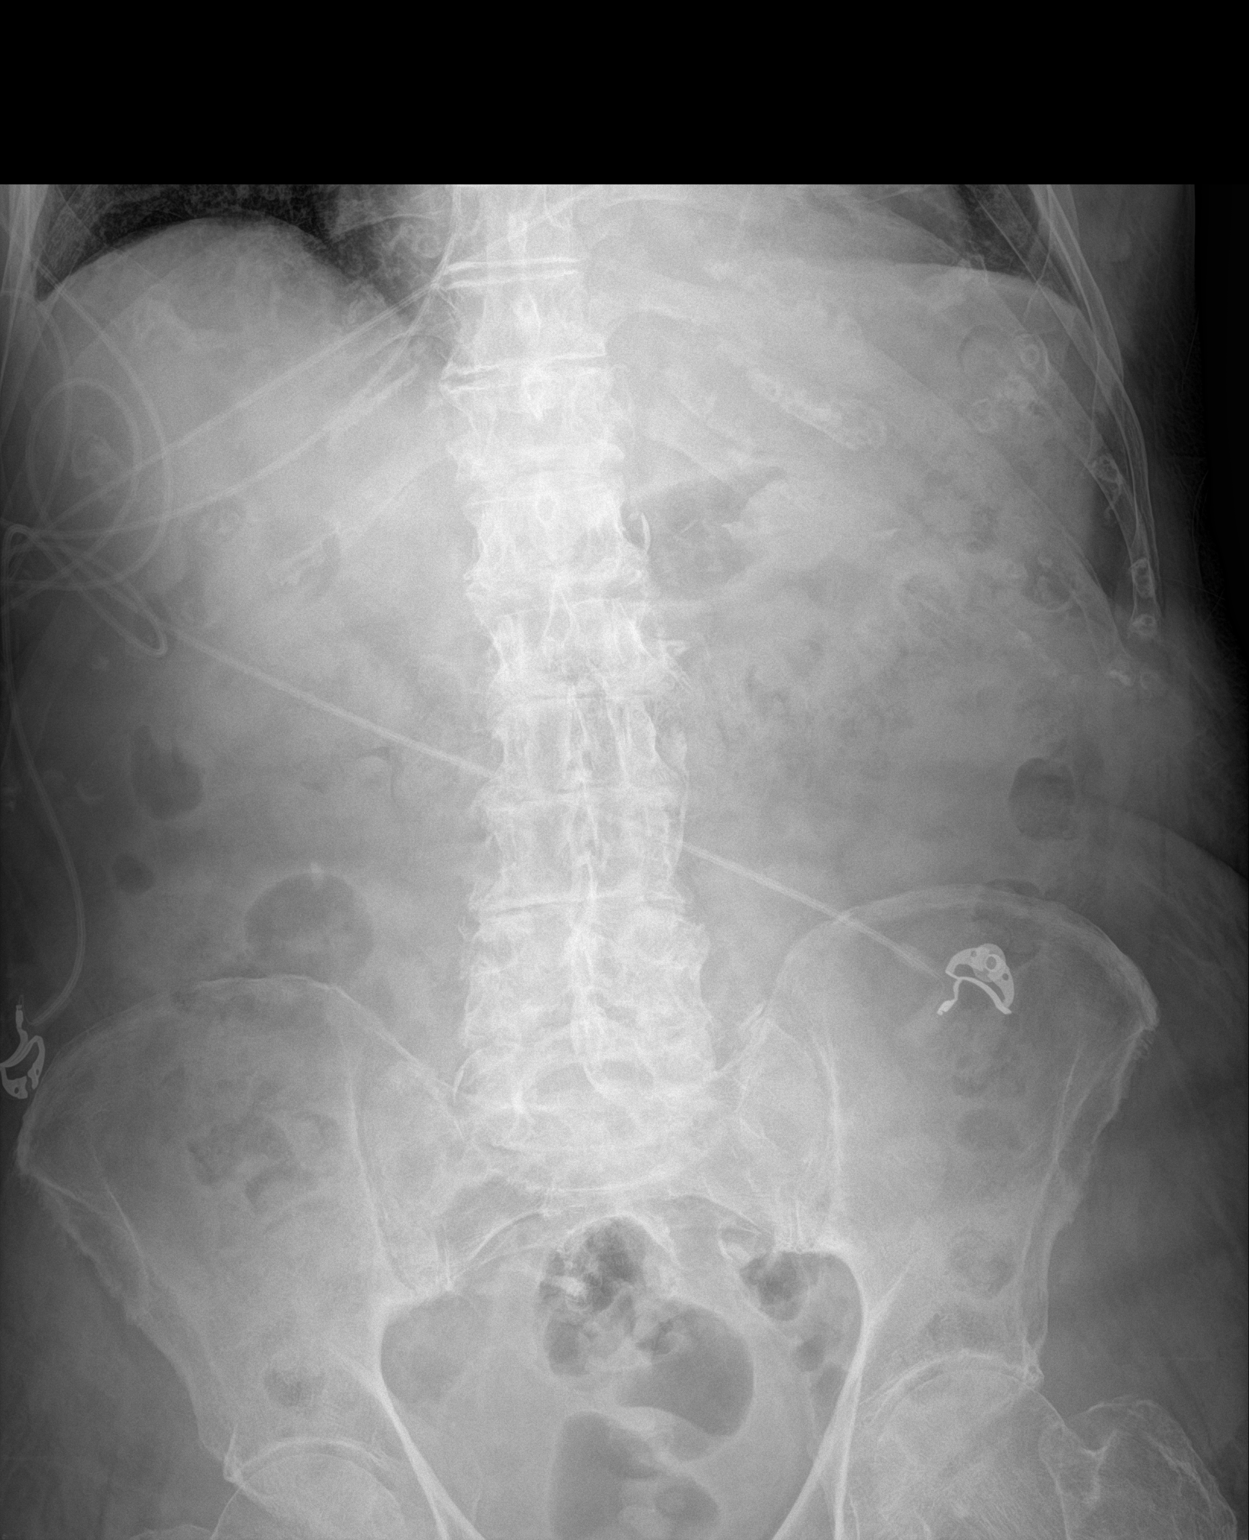
[im 2/2]
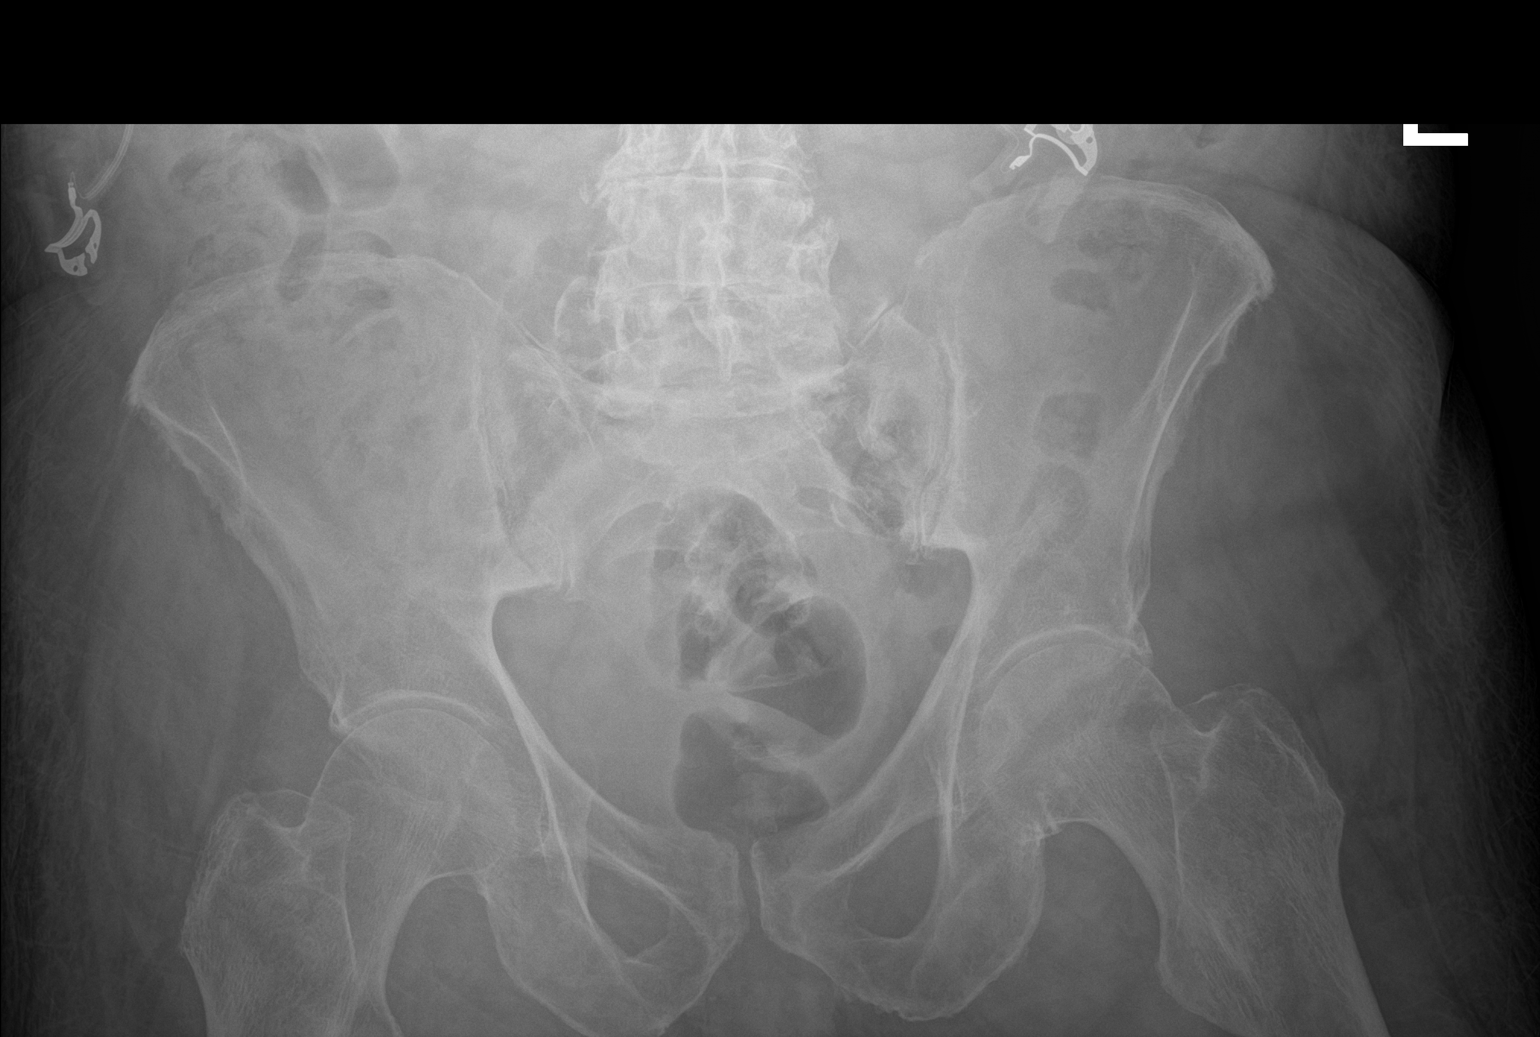

[2 of 2 positions shown; findings below may reference images not displayed]

FINDINGS: The bowel gas pattern is normal. No radio-opaque calculi or other
significant radiographic abnormality are seen. Advanced multilevel
degenerative changes of the lumbar spine and mild lumbar
levocurvature. Mild left-sided hip osteoarthrosis with periarticular
osteophytosis.
IMPRESSION: Negative.

## 2020-07-21 NOTE — Telephone Encounter (Signed)
Monitor was never returned and marked "lost" in [facility] system. Order will be cancelled.

## 2021-12-31 IMAGING — CR DG CHEST 2V
1 series · 1 of 1 positions shown · non-contrast
Comparison: Chest x-ray 09/28/2019.

CLINICAL DATA: [AGE] male with history of tachycardia and
hypoxia.

EXAM:
CHEST - 2 VIEW

[w chest lat]
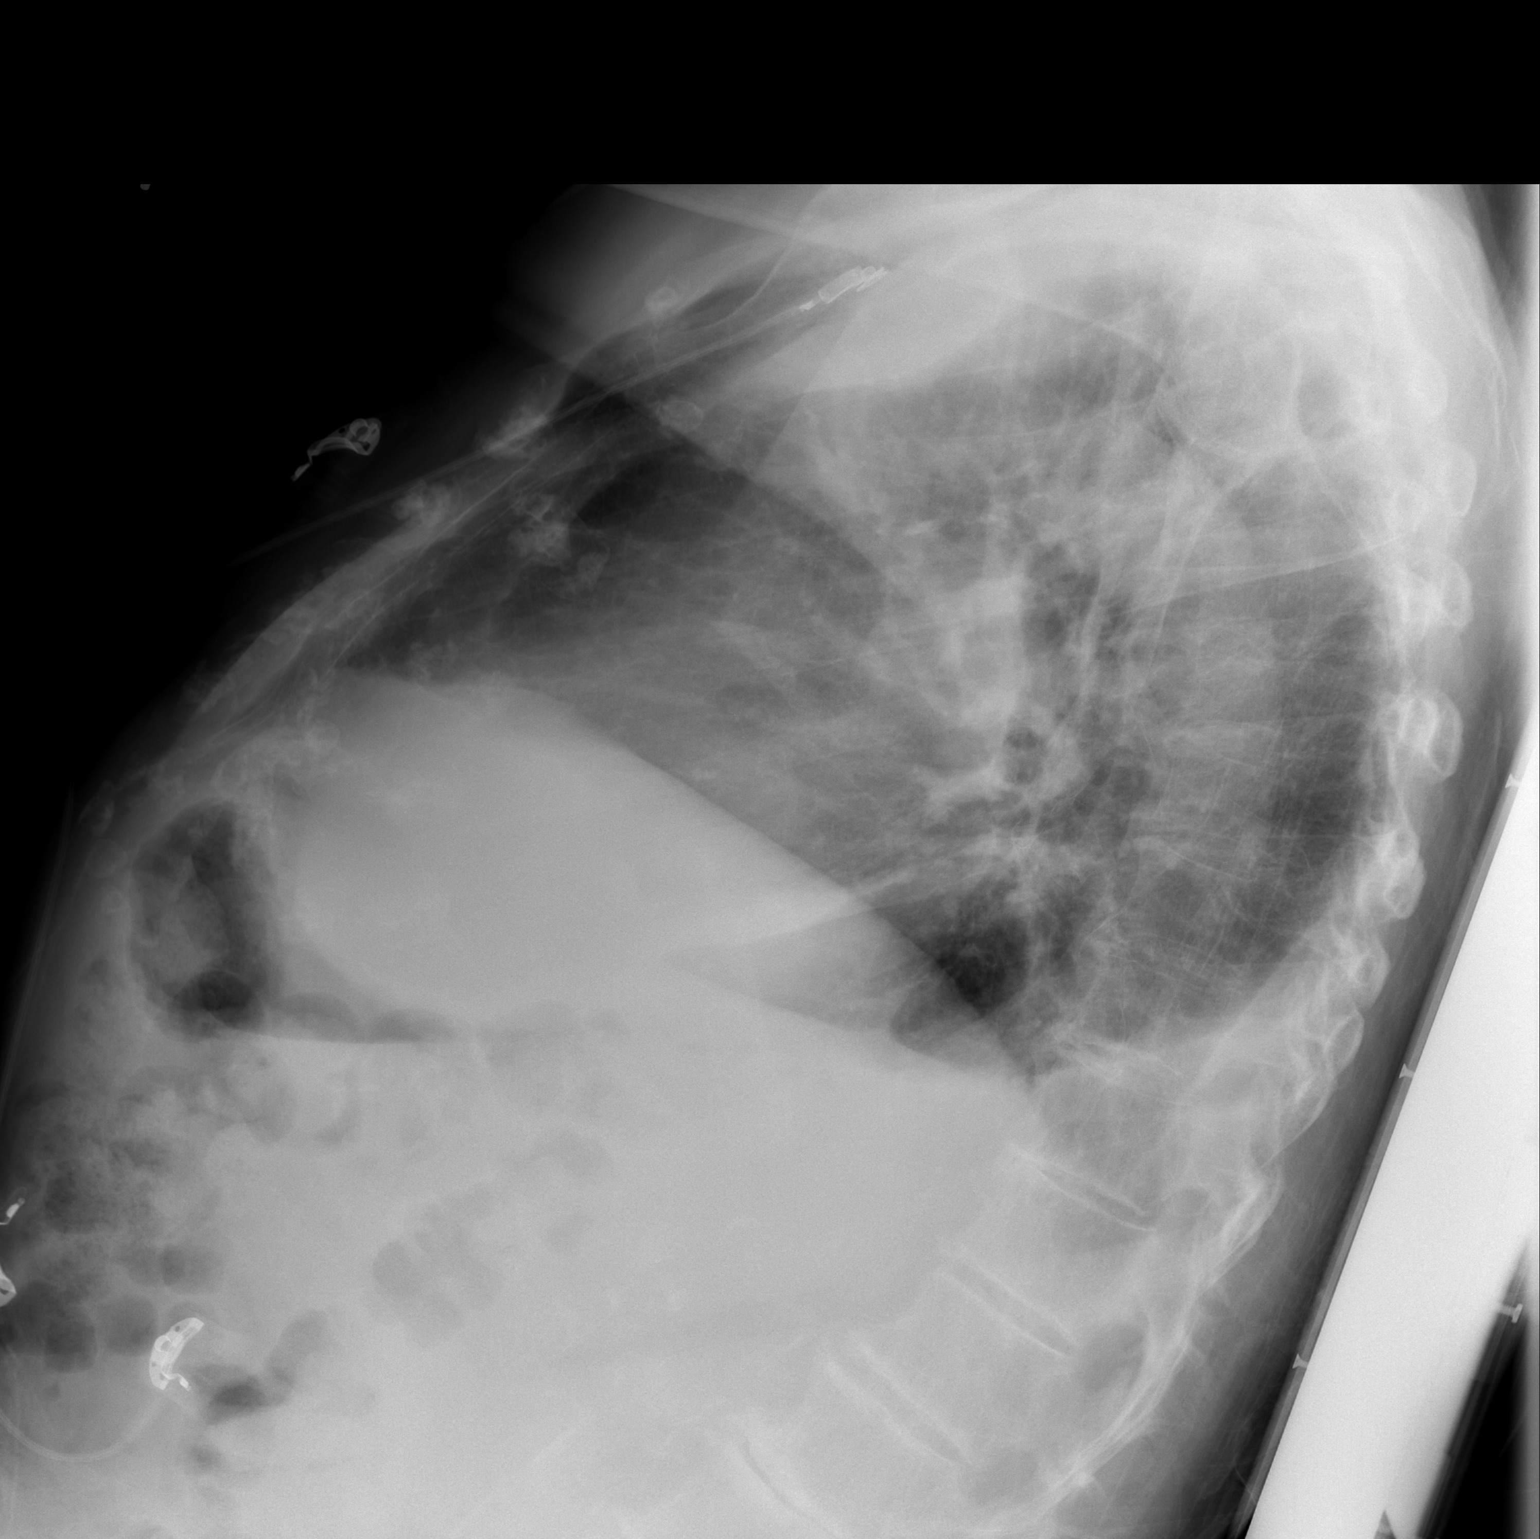

[1 of 1 positions shown; findings below may reference images not displayed]

FINDINGS: Lung volumes are low. No consolidative airspace disease. Small
bilateral pleural effusions. No pneumothorax. No suspicious
appearing pulmonary nodules or masses are noted. No evidence of
pulmonary edema. Heart size is borderline enlarged. Upper
mediastinal contours are distorted by patient's rotation to the
left. Aortic atherosclerosis.
IMPRESSION: 1. Small bilateral pleural effusions.
2. Aortic atherosclerosis.

## 2022-05-19 ENCOUNTER — Encounter (HOSPITAL_COMMUNITY): Payer: Self-pay | Admitting: *Deleted
